# Patient Record
Sex: Female | Born: 1965 | Race: White | Hispanic: No | Marital: Single | Smoking: Current every day smoker
Health system: Southern US, Community
[De-identification: ages and names within clinical notes are randomized; demographics above are authoritative.]

## PROBLEM LIST (undated history)

## (undated) DIAGNOSIS — F419 Anxiety disorder, unspecified: Secondary | ICD-10-CM

## (undated) DIAGNOSIS — F32A Depression, unspecified: Secondary | ICD-10-CM

## (undated) DIAGNOSIS — F410 Panic disorder [episodic paroxysmal anxiety] without agoraphobia: Secondary | ICD-10-CM

## (undated) DIAGNOSIS — Q211 Atrial septal defect, unspecified: Secondary | ICD-10-CM

## (undated) DIAGNOSIS — R011 Cardiac murmur, unspecified: Secondary | ICD-10-CM

## (undated) DIAGNOSIS — I10 Essential (primary) hypertension: Secondary | ICD-10-CM

## (undated) DIAGNOSIS — I639 Cerebral infarction, unspecified: Secondary | ICD-10-CM

## (undated) DIAGNOSIS — L409 Psoriasis, unspecified: Secondary | ICD-10-CM

## (undated) HISTORY — DX: Psoriasis, unspecified: L40.9

## (undated) HISTORY — DX: Atrial septal defect: Q21.1

## (undated) HISTORY — PX: CHOLECYSTECTOMY: SHX55

## (undated) HISTORY — DX: Depression, unspecified: F32.A

## (undated) HISTORY — DX: Anxiety disorder, unspecified: F41.9

## (undated) HISTORY — DX: Atrial septal defect, unspecified: Q21.10

## (undated) HISTORY — DX: Panic disorder (episodic paroxysmal anxiety): F41.0

---

## 2005-12-15 ENCOUNTER — Emergency Department (HOSPITAL_COMMUNITY): Admission: EM | Admit: 2005-12-15 | Discharge: 2005-12-15 | Payer: Self-pay | Admitting: Emergency Medicine

## 2005-12-17 ENCOUNTER — Emergency Department (HOSPITAL_COMMUNITY): Admission: EM | Admit: 2005-12-17 | Discharge: 2005-12-17 | Payer: Self-pay | Admitting: Emergency Medicine

## 2006-07-07 ENCOUNTER — Emergency Department (HOSPITAL_COMMUNITY): Admission: EM | Admit: 2006-07-07 | Discharge: 2006-07-07 | Payer: Self-pay | Admitting: Emergency Medicine

## 2006-07-09 ENCOUNTER — Emergency Department (HOSPITAL_COMMUNITY): Admission: EM | Admit: 2006-07-09 | Discharge: 2006-07-09 | Payer: Self-pay | Admitting: Emergency Medicine

## 2007-03-20 ENCOUNTER — Encounter: Payer: Self-pay | Admitting: Family Medicine

## 2007-03-20 LAB — CONVERTED CEMR LAB
Basophils Absolute: 0 10*3/uL (ref 0.0–0.1)
Eosinophils Relative: 2 % (ref 0–5)
Lymphocytes Relative: 46 % (ref 12–46)
Lymphs Abs: 2.4 10*3/uL (ref 0.7–3.3)
Neutro Abs: 2.2 10*3/uL (ref 1.7–7.7)
Neutrophils Relative %: 43 % (ref 43–77)
Platelets: 312 10*3/uL (ref 150–400)
RDW: 11.9 % (ref 11.5–14.0)
WBC: 5.2 10*3/uL (ref 4.0–10.5)

## 2007-04-19 ENCOUNTER — Encounter: Payer: Self-pay | Admitting: Family Medicine

## 2007-04-19 LAB — CONVERTED CEMR LAB
AST: 21 units/L (ref 0–37)
BUN: 7 mg/dL (ref 6–23)
Bilirubin, Direct: 0.1 mg/dL (ref 0.0–0.3)
CO2: 23 meq/L (ref 19–32)
Calcium: 9.2 mg/dL (ref 8.4–10.5)
Eosinophils Relative: 1 % (ref 0–5)
Glucose, Bld: 92 mg/dL (ref 70–99)
HCT: 36.2 % (ref 36.0–46.0)
Hemoglobin: 12.6 g/dL (ref 12.0–15.0)
Indirect Bilirubin: 0.3 mg/dL (ref 0.0–0.9)
Lymphocytes Relative: 51 % — ABNORMAL HIGH (ref 12–46)
Lymphs Abs: 2 10*3/uL (ref 0.7–3.3)
Monocytes Relative: 10 % (ref 3–11)
Platelets: 381 10*3/uL (ref 150–400)
RBC: 3.78 M/uL — ABNORMAL LOW (ref 3.87–5.11)
Sodium: 131 meq/L — ABNORMAL LOW (ref 135–145)
TSH: 1.971 microintl units/mL (ref 0.350–5.50)
Total Bilirubin: 0.4 mg/dL (ref 0.3–1.2)
Total CHOL/HDL Ratio: 2.3
VLDL: 24 mg/dL (ref 0–40)
WBC: 4 10*3/uL (ref 4.0–10.5)

## 2008-01-28 ENCOUNTER — Other Ambulatory Visit: Admission: RE | Admit: 2008-01-28 | Discharge: 2008-01-28 | Payer: Self-pay | Admitting: Unknown Physician Specialty

## 2008-01-28 ENCOUNTER — Encounter (INDEPENDENT_AMBULATORY_CARE_PROVIDER_SITE_OTHER): Payer: Self-pay | Admitting: Unknown Physician Specialty

## 2009-01-02 ENCOUNTER — Emergency Department (HOSPITAL_COMMUNITY): Admission: EM | Admit: 2009-01-02 | Discharge: 2009-01-02 | Payer: Self-pay | Admitting: Emergency Medicine

## 2009-02-27 ENCOUNTER — Emergency Department (HOSPITAL_COMMUNITY): Admission: EM | Admit: 2009-02-27 | Discharge: 2009-02-27 | Payer: Self-pay | Admitting: Emergency Medicine

## 2009-05-31 ENCOUNTER — Inpatient Hospital Stay (HOSPITAL_COMMUNITY): Admission: EM | Admit: 2009-05-31 | Discharge: 2009-06-02 | Payer: Self-pay | Admitting: Emergency Medicine

## 2010-10-09 ENCOUNTER — Encounter: Payer: Self-pay | Admitting: Family Medicine

## 2010-12-23 LAB — BASIC METABOLIC PANEL
BUN: 5 mg/dL — ABNORMAL LOW (ref 6–23)
BUN: 5 mg/dL — ABNORMAL LOW (ref 6–23)
CO2: 21 mEq/L (ref 19–32)
Calcium: 7.4 mg/dL — ABNORMAL LOW (ref 8.4–10.5)
Calcium: 7.5 mg/dL — ABNORMAL LOW (ref 8.4–10.5)
Calcium: 8.3 mg/dL — ABNORMAL LOW (ref 8.4–10.5)
Creatinine, Ser: 0.73 mg/dL (ref 0.4–1.2)
GFR calc Af Amer: >60 mL/min (ref >60)
GFR calc non Af Amer: >60 mL/min (ref >60)
GFR calc non Af Amer: >60 mL/min (ref >60)
GFR calc non Af Amer: >60 mL/min (ref >60)
Glucose, Bld: 117 mg/dL — ABNORMAL HIGH (ref 70–99)
Glucose, Bld: 138 mg/dL — ABNORMAL HIGH (ref 70–99)
Glucose, Bld: 140 mg/dL — ABNORMAL HIGH (ref 70–99)
Sodium: 135 mEq/L (ref 135–145)
Sodium: 136 mEq/L (ref 135–145)

## 2010-12-23 LAB — URINALYSIS, ROUTINE W REFLEX MICROSCOPIC
Leukocytes, UA: NEGATIVE
Nitrite: POSITIVE — AB
Specific Gravity, Urine: 1.025 (ref 1.005–1.030)
Urobilinogen, UA: 0.2 mg/dL (ref 0.0–1.0)

## 2010-12-23 LAB — CULTURE, BLOOD (ROUTINE X 2)
Culture: NO GROWTH
Culture: NO GROWTH

## 2010-12-23 LAB — CBC
Hemoglobin: 12.2 g/dL (ref 12.0–15.0)
Hemoglobin: 13.3 g/dL (ref 12.0–15.0)
Platelets: 195 10*3/uL (ref 150–400)
RBC: 4.04 MIL/uL (ref 3.87–5.11)
RDW: 14.3 % (ref 11.5–15.5)
RDW: 14.8 % (ref 11.5–15.5)

## 2010-12-23 LAB — DIFFERENTIAL
Basophils Absolute: 0 10*3/uL (ref 0.0–0.1)
Basophils Absolute: 0.1 10*3/uL (ref 0.0–0.1)
Lymphocytes Relative: 13 % (ref 12–46)
Lymphocytes Relative: 18 % (ref 12–46)
Monocytes Absolute: 0.6 10*3/uL (ref 0.1–1.0)
Neutro Abs: 11.2 10*3/uL — ABNORMAL HIGH (ref 1.7–7.7)
Neutro Abs: 9.1 10*3/uL — ABNORMAL HIGH (ref 1.7–7.7)
Neutrophils Relative %: 76 % (ref 43–77)

## 2010-12-23 LAB — RAPID URINE DRUG SCREEN, HOSP PERFORMED
Amphetamines: NOT DETECTED
Tetrahydrocannabinol: NOT DETECTED

## 2010-12-23 LAB — URINE MICROSCOPIC-ADD ON

## 2010-12-23 LAB — ACETAMINOPHEN LEVEL: Acetaminophen (Tylenol), Serum: <10 ug/mL — ABNORMAL LOW (ref 10–30)

## 2010-12-23 LAB — PREGNANCY, URINE: Preg Test, Ur: NEGATIVE

## 2010-12-23 LAB — MAGNESIUM: Magnesium: 1.7 mg/dL (ref 1.5–2.5)

## 2010-12-28 LAB — CBC
HCT: 44.9 % (ref 36.0–46.0)
MCHC: 34.6 g/dL (ref 30.0–36.0)
MCV: 91.1 fL (ref 78.0–100.0)
RBC: 4.93 MIL/uL (ref 3.87–5.11)

## 2010-12-28 LAB — BASIC METABOLIC PANEL
BUN: 6 mg/dL (ref 6–23)
CO2: 24 mEq/L (ref 19–32)
Chloride: 106 mEq/L (ref 96–112)
Creatinine, Ser: 0.83 mg/dL (ref 0.4–1.2)
GFR calc Af Amer: >60 mL/min (ref >60)
Potassium: 3.6 mEq/L (ref 3.5–5.1)

## 2010-12-28 LAB — DIFFERENTIAL
Eosinophils Relative: 2 % (ref 0–5)
Lymphocytes Relative: 15 % (ref 12–46)
Lymphs Abs: 1.5 10*3/uL (ref 0.7–4.0)
Monocytes Absolute: 0.4 10*3/uL (ref 0.1–1.0)
Neutro Abs: 8.3 10*3/uL — ABNORMAL HIGH (ref 1.7–7.7)

## 2010-12-28 LAB — PREGNANCY, URINE: Preg Test, Ur: NEGATIVE

## 2011-01-08 ENCOUNTER — Emergency Department (HOSPITAL_COMMUNITY)
Admission: EM | Admit: 2011-01-08 | Discharge: 2011-01-09 | Disposition: A | Discharge status: Home / Self Care | Payer: Self-pay | Attending: Emergency Medicine | Admitting: Emergency Medicine

## 2011-01-08 ENCOUNTER — Emergency Department (HOSPITAL_COMMUNITY): Payer: Self-pay

## 2011-01-08 ENCOUNTER — Emergency Department (HOSPITAL_COMMUNITY)
Admission: EM | Admit: 2011-01-08 | Discharge: 2011-01-08 | Disposition: A | Discharge status: Home / Self Care | Payer: Self-pay | Attending: Emergency Medicine | Admitting: Emergency Medicine

## 2011-01-08 DIAGNOSIS — K089 Disorder of teeth and supporting structures, unspecified: Secondary | ICD-10-CM | POA: Insufficient documentation

## 2011-01-08 DIAGNOSIS — K047 Periapical abscess without sinus: Secondary | ICD-10-CM | POA: Insufficient documentation

## 2011-01-08 DIAGNOSIS — L03211 Cellulitis of face: Secondary | ICD-10-CM | POA: Insufficient documentation

## 2011-01-08 DIAGNOSIS — L0201 Cutaneous abscess of face: Secondary | ICD-10-CM | POA: Insufficient documentation

## 2011-01-08 LAB — BASIC METABOLIC PANEL
BUN: 7 mg/dL (ref 6–23)
CO2: 25 mEq/L (ref 19–32)
Chloride: 103 mEq/L (ref 96–112)
Glucose, Bld: 86 mg/dL (ref 70–99)
Potassium: 3.5 mEq/L (ref 3.5–5.1)
Sodium: 136 mEq/L (ref 135–145)

## 2011-01-08 LAB — CBC
HCT: 41.7 % (ref 36.0–46.0)
Hemoglobin: 14.4 g/dL (ref 12.0–15.0)
MCV: 91.6 fL (ref 78.0–100.0)
RBC: 4.55 MIL/uL (ref 3.87–5.11)
RDW: 13 % (ref 11.5–15.5)
WBC: 10.7 10*3/uL — ABNORMAL HIGH (ref 4.0–10.5)

## 2011-01-08 LAB — DIFFERENTIAL
Basophils Absolute: 0 10*3/uL (ref 0.0–0.1)
Eosinophils Relative: 1 % (ref 0–5)
Lymphocytes Relative: 16 % (ref 12–46)
Lymphs Abs: 1.7 10*3/uL (ref 0.7–4.0)
Neutro Abs: 8 10*3/uL — ABNORMAL HIGH (ref 1.7–7.7)

## 2011-01-08 MED ORDER — IOHEXOL 300 MG/ML  SOLN
75.0000 mL | Freq: Once | INTRAMUSCULAR | Status: AC | PRN
  Administered 2011-01-08: 75 mL via INTRAVENOUS

## 2012-03-22 ENCOUNTER — Other Ambulatory Visit (HOSPITAL_COMMUNITY): Payer: Self-pay | Admitting: Nurse Practitioner

## 2012-03-22 DIAGNOSIS — Z139 Encounter for screening, unspecified: Secondary | ICD-10-CM

## 2012-03-28 ENCOUNTER — Ambulatory Visit (HOSPITAL_COMMUNITY)
Admission: RE | Admit: 2012-03-28 | Discharge: 2012-03-28 | Disposition: A | Discharge status: Home / Self Care | Payer: Self-pay | Source: Ambulatory Visit | Attending: Family Medicine | Admitting: Family Medicine

## 2012-03-28 DIAGNOSIS — Z139 Encounter for screening, unspecified: Secondary | ICD-10-CM

## 2013-09-18 DIAGNOSIS — I639 Cerebral infarction, unspecified: Secondary | ICD-10-CM

## 2013-09-18 HISTORY — DX: Cerebral infarction, unspecified: I63.9

## 2014-07-24 ENCOUNTER — Inpatient Hospital Stay (HOSPITAL_COMMUNITY)
Admission: EM | Admit: 2014-07-24 | Discharge: 2014-07-25 | DRG: 305 | Disposition: A | Discharge status: Home / Self Care | Payer: Self-pay | Attending: Internal Medicine | Admitting: Internal Medicine

## 2014-07-24 ENCOUNTER — Emergency Department (HOSPITAL_COMMUNITY): Payer: Self-pay

## 2014-07-24 ENCOUNTER — Encounter (HOSPITAL_COMMUNITY): Payer: Self-pay | Admitting: Emergency Medicine

## 2014-07-24 DIAGNOSIS — R51 Headache: Secondary | ICD-10-CM | POA: Diagnosis not present

## 2014-07-24 DIAGNOSIS — G510 Bell's palsy: Secondary | ICD-10-CM | POA: Diagnosis present

## 2014-07-24 DIAGNOSIS — F17210 Nicotine dependence, cigarettes, uncomplicated: Secondary | ICD-10-CM | POA: Diagnosis present

## 2014-07-24 DIAGNOSIS — Z9114 Patient's other noncompliance with medication regimen: Secondary | ICD-10-CM | POA: Diagnosis present

## 2014-07-24 DIAGNOSIS — Z720 Tobacco use: Secondary | ICD-10-CM

## 2014-07-24 DIAGNOSIS — I10 Essential (primary) hypertension: Principal | ICD-10-CM | POA: Diagnosis present

## 2014-07-24 DIAGNOSIS — F17200 Nicotine dependence, unspecified, uncomplicated: Secondary | ICD-10-CM | POA: Diagnosis present

## 2014-07-24 DIAGNOSIS — I160 Hypertensive urgency: Secondary | ICD-10-CM | POA: Diagnosis present

## 2014-07-24 DIAGNOSIS — Q211 Atrial septal defect, unspecified: Secondary | ICD-10-CM

## 2014-07-24 DIAGNOSIS — I161 Hypertensive emergency: Secondary | ICD-10-CM

## 2014-07-24 DIAGNOSIS — R29810 Facial weakness: Secondary | ICD-10-CM | POA: Diagnosis present

## 2014-07-24 DIAGNOSIS — T463X5A Adverse effect of coronary vasodilators, initial encounter: Secondary | ICD-10-CM | POA: Diagnosis not present

## 2014-07-24 HISTORY — DX: Essential (primary) hypertension: I10

## 2014-07-24 HISTORY — DX: Cardiac murmur, unspecified: R01.1

## 2014-07-24 LAB — CBC WITH DIFFERENTIAL/PLATELET
BASOS ABS: 0 10*3/uL (ref 0.0–0.1)
BASOS PCT: 1 % (ref 0–1)
EOS PCT: 7 % — AB (ref 0–5)
Eosinophils Absolute: 0.5 10*3/uL (ref 0.0–0.7)
HEMATOCRIT: 42.3 % (ref 36.0–46.0)
HEMOGLOBIN: 14.8 g/dL (ref 12.0–15.0)
Lymphocytes Relative: 24 % (ref 12–46)
Lymphs Abs: 1.6 10*3/uL (ref 0.7–4.0)
MCH: 32.2 pg (ref 26.0–34.0)
MCHC: 35 g/dL (ref 30.0–36.0)
MCV: 92 fL (ref 78.0–100.0)
MONO ABS: 0.5 10*3/uL (ref 0.1–1.0)
MONOS PCT: 8 % (ref 3–12)
NEUTROS ABS: 4.1 10*3/uL (ref 1.7–7.7)
Neutrophils Relative %: 60 % (ref 43–77)
Platelets: 151 10*3/uL (ref 150–400)
RBC: 4.6 MIL/uL (ref 3.87–5.11)
RDW: 14 % (ref 11.5–15.5)
WBC: 6.8 10*3/uL (ref 4.0–10.5)

## 2014-07-24 LAB — RAPID URINE DRUG SCREEN, HOSP PERFORMED
AMPHETAMINES: NOT DETECTED
Barbiturates: NOT DETECTED
Benzodiazepines: NOT DETECTED
Cocaine: NOT DETECTED
OPIATES: NOT DETECTED
Tetrahydrocannabinol: NOT DETECTED

## 2014-07-24 LAB — URINALYSIS, ROUTINE W REFLEX MICROSCOPIC
Bilirubin Urine: NEGATIVE
GLUCOSE, UA: NEGATIVE mg/dL
Hgb urine dipstick: NEGATIVE
KETONES UR: NEGATIVE mg/dL
NITRITE: NEGATIVE
Protein, ur: NEGATIVE mg/dL
Specific Gravity, Urine: 1.01 (ref 1.005–1.030)
UROBILINOGEN UA: 0.2 mg/dL (ref 0.0–1.0)
pH: 6.5 (ref 5.0–8.0)

## 2014-07-24 LAB — MRSA PCR SCREENING: MRSA by PCR: NEGATIVE

## 2014-07-24 LAB — BASIC METABOLIC PANEL
ANION GAP: 12 (ref 5–15)
BUN: 13 mg/dL (ref 6–23)
CALCIUM: 9.3 mg/dL (ref 8.4–10.5)
CHLORIDE: 103 meq/L (ref 96–112)
CO2: 26 meq/L (ref 19–32)
CREATININE: 0.88 mg/dL (ref 0.50–1.10)
GFR calc Af Amer: 90 mL/min — ABNORMAL LOW (ref >90)
GFR calc non Af Amer: 78 mL/min — ABNORMAL LOW (ref >90)
GLUCOSE: 90 mg/dL (ref 70–99)
Potassium: 4.4 mEq/L (ref 3.7–5.3)
Sodium: 141 mEq/L (ref 137–147)

## 2014-07-24 LAB — TROPONIN I
Troponin I: <0.3 ng/mL (ref <0.30)
Troponin I: <0.3 ng/mL (ref <0.30)

## 2014-07-24 LAB — URINE MICROSCOPIC-ADD ON

## 2014-07-24 MED ORDER — ACETAMINOPHEN 650 MG RE SUPP: 650.0000 mg | Freq: Four times a day (QID) | RECTAL | Status: DC | PRN

## 2014-07-24 MED ORDER — ONDANSETRON HCL 4 MG/2ML IJ SOLN: 4.0000 mg | Freq: Four times a day (QID) | INTRAMUSCULAR | Status: DC | PRN

## 2014-07-24 MED ORDER — SODIUM CHLORIDE 0.45 % IV SOLN
INTRAVENOUS | Status: DC
  Administered 2014-07-24: 19:00:00 via INTRAVENOUS

## 2014-07-24 MED ORDER — ACETAMINOPHEN 325 MG PO TABS
650.0000 mg | ORAL_TABLET | Freq: Four times a day (QID) | ORAL | Status: DC | PRN
  Administered 2014-07-24: 650 mg via ORAL
  Filled 2014-07-24: qty 2

## 2014-07-24 MED ORDER — NITROGLYCERIN IN D5W 200-5 MCG/ML-% IV SOLN
0.0000 ug/min | INTRAVENOUS | Status: DC
  Administered 2014-07-24: 5 ug/min via INTRAVENOUS
  Filled 2014-07-24: qty 250

## 2014-07-24 MED ORDER — ONDANSETRON HCL 4 MG PO TABS
4.0000 mg | ORAL_TABLET | Freq: Four times a day (QID) | ORAL | Status: DC | PRN
  Administered 2014-07-24: 4 mg via ORAL
  Filled 2014-07-24: qty 1

## 2014-07-24 MED ORDER — METOPROLOL TARTRATE 25 MG PO TABS
25.0000 mg | ORAL_TABLET | Freq: Two times a day (BID) | ORAL | Status: DC
  Administered 2014-07-24: 25 mg via ORAL
  Filled 2014-07-24: qty 1

## 2014-07-24 MED ORDER — ASPIRIN 81 MG PO CHEW
324.0000 mg | CHEWABLE_TABLET | Freq: Once | ORAL | Status: AC
  Administered 2014-07-24: 324 mg via ORAL
  Filled 2014-07-24: qty 4

## 2014-07-24 MED ORDER — SODIUM CHLORIDE 0.9 % IV SOLN
Freq: Once | INTRAVENOUS | Status: AC
  Administered 2014-07-24: 13:00:00 via INTRAVENOUS

## 2014-07-24 MED ORDER — HEPARIN SODIUM (PORCINE) 5000 UNIT/ML IJ SOLN
5000.0000 [IU] | Freq: Three times a day (TID) | INTRAMUSCULAR | Status: DC
  Administered 2014-07-24 – 2014-07-25 (×3): 5000 [IU] via SUBCUTANEOUS
  Filled 2014-07-24 (×2): qty 1

## 2014-07-24 MED ORDER — LISINOPRIL 10 MG PO TABS
20.0000 mg | ORAL_TABLET | Freq: Every day | ORAL | Status: DC
  Administered 2014-07-24 – 2014-07-25 (×2): 20 mg via ORAL
  Filled 2014-07-24 (×2): qty 2

## 2014-07-24 MED ORDER — NICARDIPINE HCL IN NACL 20-0.86 MG/200ML-% IV SOLN
3.0000 mg/h | INTRAVENOUS | Status: DC
  Administered 2014-07-24: 5 mg/h via INTRAVENOUS
  Filled 2014-07-24: qty 200

## 2014-07-24 MED ORDER — ASPIRIN EC 81 MG PO TBEC
81.0000 mg | DELAYED_RELEASE_TABLET | Freq: Every day | ORAL | Status: DC
  Administered 2014-07-24 – 2014-07-25 (×2): 81 mg via ORAL
  Filled 2014-07-24 (×2): qty 1

## 2014-07-24 NOTE — Consult Note (Signed)
Meghan A. Merlene Laughter, MD     [email or web address]          Meghan Simmons is an 48 y.o. female.   ASSESSMENT/PLAN: Left facial weakness due to accelerated hypertension/hypertensive crisis.  Headaches due to nitroglycerin improving.    The patient is a 48 year old white female who has a history of hypertension. She has been on antihypertensive medication for quite a while. She does admit that she does not take her medications consistently because of being uninsured. The patient has been out of her medications for the past 2 months because of not having the funds. She developed the rather acute onset of left facial weakness, tingling or numbness. It appears that she may have had some dysarthria although this has resolved. She presented to the emergency room and was noted to have severe elevation in blood pressure with a systolic of 010 and diastolic of 932. She does report having some retrosternal chest discomfort that radiates to the left shoulder region. She does not report any numbness or weakness of the upper extremities. There is no GI or GU symptoms. She did not have a headache before she will was hospitalized. She reports that the IV nitroglycerin drip has caused headache. The patient reports having some blurry vision involving the left eye. She reports having a cat and having increased matter of her eyes recently after playing with a cat. The review of systems otherwise negative.  GENERAL: This a pleasant female in no acute distress.  HEENT: Supple. Atraumatic normocephalic. There appears to be some injection of the right eye although this is asymptomatic side.  ABDOMEN: soft  EXTREMITIES: No edema   BACK: Normal.  SKIN: Normal by inspection.    MENTAL STATUS: Alert and oriented. Speech, language and cognition are generally intact. Judgment and insight normal.   CRANIAL NERVES: Pupils are equal, round and reactive to light and accommodation; extra ocular  movements are full, there is no significant nystagmus; visual fields are full; upper and lower facial muscles are normal in strength and symmetric, there is mild flattening of the nasolabial fold on the left side; tongue is midline; uvula is midline; shoulder elevation is normal. Funduscopic examination shows a mild blurring of the disc margin. I do not appreciate spontaneous venous pulsations on either side.  MOTOR: Normal tone, bulk and strength; no pronator drift.  COORDINATION: Left finger to nose is normal, right finger to nose is normal, No rest tremor; no intention tremor; no postural tremor; no bradykinesia.  REFLEXES: Deep tendon reflexes are symmetrical and normal. Babinski reflexes are flexor bilaterally.   SENSATION: Normal to light touch.   Brain MRI scan is reviewed in person. There is no increased signal seen on diffusion imaging. FLAIR imaging is also unrevealing without white matter tract lesions. There appears to be atrophy of the occipital regions bilaterally greater than expected for age.   Blood pressure 180/110, pulse 67, temperature 97.6 F (36.4 C), temperature source Oral, resp. rate 15, height '5\' 4"'  (1.626 m), weight 80.74 kg (178 lb), last menstrual period 04/23/2014, SpO2 100 %.  Past Medical History  Diagnosis Date  . Hypertension   . Heart murmur     Past Surgical History  Procedure Laterality Date  . Cholecystectomy      History reviewed. No pertinent family history.  Social History:  reports that she has been smoking Cigarettes.  She has been smoking about 1.50 packs per day. She does not have any smokeless tobacco history on file. She reports  that she drinks alcohol. She reports that she does not use illicit drugs.  Allergies:  Allergies  Allergen Reactions  . Erythromycin Nausea And Vomiting and Rash    Medications: Prior to Admission medications   Medication Sig Start Date End Date Taking? Authorizing Provider  aspirin 325 MG tablet Take 325  mg by mouth daily as needed for moderate pain.   Yes Historical Provider, MD  OVER THE COUNTER MEDICATION Place 1 drop into both eyes daily as needed (irritation).   Yes Historical Provider, MD    Scheduled Meds: . aspirin EC  81 mg Oral Daily  . heparin  5,000 Units Subcutaneous 3 times per day  . lisinopril  20 mg Oral Daily  . metoprolol tartrate  25 mg Oral BID   Continuous Infusions: . sodium chloride 50 mL/hr at 07/24/14 1901  . niCARDipine 5 mg/hr (07/24/14 1956)   PRN Meds:.acetaminophen **OR** acetaminophen, ondansetron **OR** ondansetron (ZOFRAN) IV     Results for orders placed or performed during the hospital encounter of 07/24/14 (from the past 48 hour(s))  Basic metabolic panel     Status: Abnormal   Collection Time: 07/24/14 11:27 AM  Result Value Ref Range   Sodium 141 137 - 147 mEq/L   Potassium 4.4 3.7 - 5.3 mEq/L   Chloride 103 96 - 112 mEq/L   CO2 26 19 - 32 mEq/L   Glucose, Bld 90 70 - 99 mg/dL   BUN 13 6 - 23 mg/dL   Creatinine, Ser 0.88 0.50 - 1.10 mg/dL   Calcium 9.3 8.4 - 10.5 mg/dL   GFR calc non Af Amer 78 (L) >90 mL/min   GFR calc Af Amer 90 (L) >90 mL/min    Comment: (NOTE) The eGFR has been calculated using the CKD EPI equation. This calculation has not been validated in all clinical situations. eGFR's persistently <90 mL/min signify possible Chronic Kidney Disease.    Anion gap 12 5 - 15  CBC with Differential     Status: Abnormal   Collection Time: 07/24/14 11:27 AM  Result Value Ref Range   WBC 6.8 4.0 - 10.5 K/uL   RBC 4.60 3.87 - 5.11 MIL/uL   Hemoglobin 14.8 12.0 - 15.0 g/dL   HCT 42.3 36.0 - 46.0 %   MCV 92.0 78.0 - 100.0 fL   MCH 32.2 26.0 - 34.0 pg   MCHC 35.0 30.0 - 36.0 g/dL   RDW 14.0 11.5 - 15.5 %   Platelets 151 150 - 400 K/uL   Neutrophils Relative % 60 43 - 77 %   Neutro Abs 4.1 1.7 - 7.7 K/uL   Lymphocytes Relative 24 12 - 46 %   Lymphs Abs 1.6 0.7 - 4.0 K/uL   Monocytes Relative 8 3 - 12 %   Monocytes Absolute  0.5 0.1 - 1.0 K/uL   Eosinophils Relative 7 (H) 0 - 5 %   Eosinophils Absolute 0.5 0.0 - 0.7 K/uL   Basophils Relative 1 0 - 1 %   Basophils Absolute 0.0 0.0 - 0.1 K/uL  Troponin I     Status: None   Collection Time: 07/24/14 11:27 AM  Result Value Ref Range   Troponin I <0.30 <0.30 ng/mL    Comment:        Due to the release kinetics of cTnI, a negative result within the first hours of the onset of symptoms does not rule out myocardial infarction with certainty. If myocardial infarction is still suspected, repeat the test at appropriate intervals.  MRSA PCR Screening     Status: None   Collection Time: 07/24/14  3:54 PM  Result Value Ref Range   MRSA by PCR NEGATIVE NEGATIVE    Comment:        The GeneXpert MRSA Assay (FDA approved for NASAL specimens only), is one component of a comprehensive MRSA colonization surveillance program. It is not intended to diagnose MRSA infection nor to guide or monitor treatment for MRSA infections.   Troponin I     Status: None   Collection Time: 07/24/14  7:05 PM  Result Value Ref Range   Troponin I <0.30 <0.30 ng/mL    Comment:        Due to the release kinetics of cTnI, a negative result within the first hours of the onset of symptoms does not rule out myocardial infarction with certainty. If myocardial infarction is still suspected, repeat the test at appropriate intervals.     Studies/Results: BRAIN MRI FINDINGS: Images are mildly degraded by motion artifact despite repeating multiple sequences.  There is no acute infarct. Ventricles and sulci are normal for age. There is no evidence of intracranial hemorrhage, mass, midline shift, or extra-axial fluid collection. No brain parenchymal signal abnormality is identified.  Orbits are unremarkable. Paranasal sinuses and mastoid air cells are clear. Major intracranial vascular flow voids are preserved. Distal right vertebral artery appears hypoplastic. Calvarium and scalp  soft tissues are unremarkable.  IMPRESSION: No acute intracranial abnormality identified. Unremarkable appearance of the brain allowing for mild motion artifact    Janaria Mccammon A. Merlene Simmons, M.D.  Diplomate, Tax adviser of Psychiatry and Neurology ( Neurology). 07/24/2014, 8:15 PM

## 2014-07-24 NOTE — Progress Notes (Signed)
[facility] Progress Note Patient Name: NEOMI LAIDLER DOB: [birth year] MRN: [id number]   Date of Service  07/24/2014  HPI/Events of Note  Hypertensive crisis Adm MAP 190 - goal 20-30% reduction  eICU Interventions  Use cardene for MAP goal 110-120   New ICU patient evaluation: This patient was evaluated by the [facility] team. I have reviewed relevant documentation including care plan & orders.   Intervention Category Evaluation Type: New Patient Evaluation  ALVA,RAKESH V. 07/24/2014, 7:50 PM

## 2014-07-24 NOTE — ED Notes (Signed)
Continues to have a left sided facial droop.  No distress at this time.  Neuro exam otherwise normal.

## 2014-07-24 NOTE — ED Notes (Signed)
PT stated yesterday evening she noticed the left side of her face became numb and her mouth had a droop and her left eyelid would not close tight. PT has visual left sided facial droop on arrival to ED. PT states she has been out of her HTN medication x2 months.

## 2014-07-24 NOTE — H&P (Signed)
[facility] History and Physical  Meghan Simmons TTS:[id number] DOB: February 29, 1966 DOA: 07/24/2014  Referring physician: Dr. Winfred Leeds PCP: No PCP Per Patient   Chief Complaint: left facial droop  HPI: Meghan Simmons is a 48 y.o. female this 48 year old female with a history of hypertension who has not taken any medication in the past few months since she's been unable to afford it. Patient reports being usual state of health when yesterday evening, she developed drainage in her right eye. She noticed crusting on her lip when she woke up this morning. Through the course of the day, she developed left facial droop and had difficulty drinking her coffee. She reports that her coffee repeatedly spilled out of her mouth. She notices that she is having difficulty closing her left eye. She reports tingling on the left side of her face, also describes pain in her left neck and left trapezius area. She was concerned that she may be having a stroke and came to the emergency room for evaluation. On evaluation, she was noted to be severely hypertensive with a systolic blood pressure of 255/146. MRI brain was negative for stroke. She was started on nitroglycerin infusion and reports that she has since developed a headache as well as nausea. She denies any shortness of breath, vomiting, diarrhea. She also describes substernal chest pain which began today. Patient will be admitted to the hospital for further treatments.   Review of Systems:  Pertinent positives as per HPI, otherwise negative  Past Medical History  Diagnosis Date  . Hypertension   . Heart murmur    Past Surgical History  Procedure Laterality Date  . Cholecystectomy     Social History:  reports that she has been smoking Cigarettes.  She has been smoking about 1.50 packs per day. She does not have any smokeless tobacco history on file. She reports that she drinks alcohol. She reports that she does not use illicit drugs.  Allergies    Allergen Reactions  . Erythromycin Nausea And Vomiting and Rash    History reviewed. No pertinent family history.   Prior to Admission medications   Medication Sig Start Date End Date Taking? Authorizing Provider  aspirin 325 MG tablet Take 325 mg by mouth daily as needed for moderate pain.   Yes Historical Provider, MD  OVER THE COUNTER MEDICATION Place 1 drop into both eyes daily as needed (irritation).   Yes Historical Provider, MD   Physical Exam: Filed Vitals:   07/24/14 1656 07/24/14 1700 07/24/14 1715 07/24/14 1730  BP:  203/122 190/119 200/113  Pulse:  72  83  Temp: 97.6 F (36.4 C)     TempSrc: Oral     Resp:  13 16 23   Height:      Weight:      SpO2:  100%  93%    Wt Readings from Last 3 Encounters:  07/24/14 80.74 kg (178 lb)  07/24/14 80.74 kg (178 lb)    General:  Appears calm and comfortable Eyes: PERRL, normal lids, irises & conjunctiva ENT: grossly normal hearing, lips & tongue Neck: no LAD, masses or thyromegaly Cardiovascular: RRR, no m/r/g. No LE edema. Telemetry: SR, no arrhythmias  Respiratory: CTA bilaterally, no w/r/r. Normal respiratory effort. Abdomen: soft, ntnd Skin: no rash or induration seen on limited exam Musculoskeletal: grossly normal tone BUE/BLE Psychiatric: grossly normal mood and affect, speech fluent and appropriate Neurologic: strength is 5/5 in upper and lower extremities, parasthesias on the left face, mild left facial droop  Labs on Admission:  Basic Metabolic Panel:  Recent Labs Lab 07/24/14 1127  NA 141  K 4.4  CL 103  CO2 26  GLUCOSE 90  BUN 13  CREATININE 0.88  CALCIUM 9.3   Liver Function Tests: No results for input(s): AST, ALT, ALKPHOS, BILITOT, PROT, ALBUMIN in the last 168 hours. No results for input(s): LIPASE, AMYLASE in the last 168 hours. No results for input(s): AMMONIA in the last 168 hours. CBC:  Recent Labs Lab 07/24/14 1127  WBC 6.8  NEUTROABS 4.1  HGB 14.8  HCT 42.3  MCV  92.0  PLT 151   Cardiac Enzymes:  Recent Labs Lab 07/24/14 1127  TROPONINI <0.30    BNP (last 3 results) No results for input(s): PROBNP in the last 8760 hours. CBG: No results for input(s): GLUCAP in the last 168 hours.  Radiological Exams on Admission: Mr Brain Wo Contrast  07/24/2014   CLINICAL DATA:  Left-sided facial droop, facial numbness, and left-sided neck pain for the past day.  EXAM: MRI HEAD WITHOUT CONTRAST  TECHNIQUE: Multiplanar, multiecho pulse sequences of the brain and surrounding structures were obtained without intravenous contrast.  COMPARISON:  Head CT 01/02/2009  FINDINGS: Images are mildly degraded by motion artifact despite repeating multiple sequences.  There is no acute infarct. Ventricles and sulci are normal for age. There is no evidence of intracranial hemorrhage, mass, midline shift, or extra-axial fluid collection. No brain parenchymal signal abnormality is identified.  Orbits are unremarkable. Paranasal sinuses and mastoid air cells are clear. Major intracranial vascular flow voids are preserved. Distal right vertebral artery appears hypoplastic. Calvarium and scalp soft tissues are unremarkable.  IMPRESSION: No acute intracranial abnormality identified. Unremarkable appearance of the brain allowing for mild motion artifact.   Electronically Signed   By: Logan Bores   On: 07/24/2014 12:38    EKG: Independently reviewed. No acute findings  Assessment/Plan Active Problems:   Hypertensive urgency   Facial droop   Tobacco use disorder   1. Hypertensive urgency. Patient is unsure what her chronic medications were, but has not taken any medication in several months. She'll be started on a nicardipine infusion, oral Lopressor and lisinopril. Her infusion will be titrated off as tolerated. Check urine tox screen 2. Chest pain. Possibly related to #1. We'll cycle cardiac markers and check 2-D echocardiogram. EKG is nonischemic. 3. Left facial droop. MRI is  negative for stroke. Positive related to a Bell's palsy. A benefit from prednisone. Requested neurology consultation. 4. Tobacco use. Counseled on the importance of tobacco cessation. Patient declined a nicotine patch.  Code Status: full code DVT Prophylaxis: heparin sq Family Communication: discussed with patient Disposition Plan: discharge home once improved  Time spent: 57mins  Deja Pisarski [facility] Pager [phone]

## 2014-07-24 NOTE — ED Provider Notes (Signed)
CSN: [id number]     Arrival date & time 07/24/14  1003 History   First MD Initiated Contact with Patient 07/24/14 1055    This chart was scribed for Orlie Dakin, MD by Terressa Koyanagi, ED Scribe. This patient was seen in room APA12/APA12 and the patient's care was started at 10:55 AM.  Chief Complaint  Patient presents with  . Facial Droop   The history is provided by the patient. No language interpreter was used.   PCP: [facility] HPI Comments: Meghan Simmons is a 48 y.o. female, with medical Hx noted below and significant for HTN,  noncompliance (pt has not taken her HTN meds for the past two months), and tobacco use (1.5 ppd), who presents to the Emergency Department complaining of acute, left sided facial droop with associated numbness onset yesterday evening. Pt also complains that she cannot close her left eyelid completely resulting in buildup of eye discharge onset 2 days ago. Pt reports she has been applying eye drops to her left eye with some relief. Pt also complains of left sided neck pain and left sided chest pain. Neck pain and chest pain or mild at present. Nothing makes symptoms better or worse. She does admit to mild shortness of breath. No treatment prior to coming herePt denies substance abuse.    During exam pt's BP is 245/171.   Past Medical History  Diagnosis Date  . Hypertension   . Heart murmur    Past Surgical History  Procedure Laterality Date  . Cholecystectomy     No family history on file. History  Substance Use Topics  . Smoking status: Current Every Day Smoker -- 1.50 packs/day    Types: Cigarettes  . Smokeless tobacco: Not on file  . Alcohol Use: Yes     Comment: once a week   OB History    No data available     Review of Systems  Constitutional: Negative.   HENT: Negative.   Respiratory: Positive for chest tightness and shortness of breath.   Cardiovascular: Negative.   Gastrointestinal: Negative.   Musculoskeletal: Negative.   Skin:  Negative.   Neurological: Positive for facial asymmetry.  Psychiatric/Behavioral: Negative.   All other systems reviewed and are negative.     Allergies  Azithromycin  Home Medications   Prior to Admission medications   Not on File   Triage Vitals: BP 246/153 mmHg  Pulse 78  Temp(Src) 97.9 F (36.6 C) (Oral)  Resp 18  Ht 5\' 4"  (1.626 m)  Wt 178 lb (80.74 kg)  BMI 30.54 kg/m2  SpO2 98%  LMP 04/23/2014 Physical Exam  Constitutional: She is oriented to person, place, and time. She appears well-developed and well-nourished.  HENT:  Head: Normocephalic and atraumatic.  Eyes: Conjunctivae are normal. Pupils are equal, round, and reactive to light.  Neck: Neck supple. No tracheal deviation present. No thyromegaly present.  Cardiovascular: Normal rate and regular rhythm.   No murmur heard. Pulmonary/Chest: Effort normal and breath sounds normal.  Abdominal: Soft. Bowel sounds are normal. She exhibits no distension. There is no tenderness.  Musculoskeletal: Normal range of motion. She exhibits no edema or tenderness.  Neurological: She is alert and oriented to person, place, and time. She has normal reflexes. A cranial nerve deficit is present. Coordination normal.  Left-sided facial droop, most consistent with central cranial nerve VII deficitRomberg normal DTR symmetric bilaterally knee jerk ankle jerk biceps toes were downgoing bilaterally alert Glasgow Coma Score 15  Skin: Skin is warm and  dry. No rash noted.  Psychiatric: She has a normal mood and affect.  Nursing note and vitals reviewed.   ED Course  Procedures (including critical care time) DIAGNOSTIC STUDIES: Oxygen Saturation is 98% on RA, nl by my interpretation.    COORDINATION OF CARE: 11:04 AM-Discussed treatment plan which includes EKG, meds and hospital admittance with pt at bedside. Patient verbalizes understanding and agrees with treatment plan.     Labs Review Labs Reviewed - No data to  display  Imaging Review No results found.   EKG Interpretation   Date/Time:  Friday July 24 2014 10:34:07 EST Ventricular Rate:  76 PR Interval:  173 QRS Duration: 118 QT Interval:  427 QTC Calculation: 480 R Axis:   152 Text Interpretation:  Sinus rhythm Probable left atrial enlargement  Nonspecific intraventricular conduction delay Low voltage, precordial  leads Baseline wander in lead(s) V3 V4 V6 No significant change since last  tracing Confirmed by Lunell Robart  MD, Abbygael Curtiss ([id number]) on 07/24/2014 10:55:01 AM     1:30 PM chest pain is improved after treatment with intravenous nitroglycerin drip. His presently mild. MDM  I don't believe the patient suffered acute stroke with facial droop. MRI scan ordered to check for bleed or tumor. Clinically she has a peripheral seventh nerve palsy left side. I'm concerned about chest pain with extremely elevated blood pressure, and concern for hypertensive emergency Final diagnoses:  None  spoke with Dr. Roderic Palau,  Plan admit step down unit Diagnosis #1 hypertensive emergency 2 chest pain #3 Bell's palsy #4 medication noncompliance #5 tobacco abuse  CRITICAL CARE Performed by: Orlie Dakin Total critical care time: 30 minute Critical care time was exclusive of separately billable procedures and treating other patients. Critical care was necessary to treat or prevent imminent or life-threatening deterioration. Critical care was time spent personally by me on the following activities: development of treatment plan with patient and/or surrogate as well as nursing, discussions with consultants, evaluation of patient's response to treatment, examination of patient, obtaining history from patient or surrogate, ordering and performing treatments and interventions, ordering and review of laboratory studies, ordering and review of radiographic studies, pulse oximetry and re-evaluation of patient's condition.     Orlie Dakin, MD 07/24/14 1430

## 2014-07-24 NOTE — ED Notes (Signed)
Pt going to MRI.  EDP ok with waiting to start nitro drip when pt returns.

## 2014-07-25 DIAGNOSIS — Q211 Atrial septal defect, unspecified: Secondary | ICD-10-CM

## 2014-07-25 LAB — BASIC METABOLIC PANEL
ANION GAP: 12 (ref 5–15)
BUN: 13 mg/dL (ref 6–23)
CO2: 24 meq/L (ref 19–32)
CREATININE: 0.99 mg/dL (ref 0.50–1.10)
Calcium: 9 mg/dL (ref 8.4–10.5)
Chloride: 103 mEq/L (ref 96–112)
GFR calc Af Amer: 78 mL/min — ABNORMAL LOW (ref >90)
GFR calc non Af Amer: 67 mL/min — ABNORMAL LOW (ref >90)
Glucose, Bld: 103 mg/dL — ABNORMAL HIGH (ref 70–99)
Potassium: 4 mEq/L (ref 3.7–5.3)
Sodium: 139 mEq/L (ref 137–147)

## 2014-07-25 LAB — CBC
HEMATOCRIT: 39.3 % (ref 36.0–46.0)
Hemoglobin: 13.9 g/dL (ref 12.0–15.0)
MCH: 32.8 pg (ref 26.0–34.0)
MCHC: 35.4 g/dL (ref 30.0–36.0)
MCV: 92.7 fL (ref 78.0–100.0)
Platelets: 164 10*3/uL (ref 150–400)
RBC: 4.24 MIL/uL (ref 3.87–5.11)
RDW: 14.1 % (ref 11.5–15.5)
WBC: 7.7 10*3/uL (ref 4.0–10.5)

## 2014-07-25 LAB — TROPONIN I

## 2014-07-25 LAB — TSH: TSH: 1.51 u[IU]/mL (ref 0.350–4.500)

## 2014-07-25 MED ORDER — LISINOPRIL 20 MG PO TABS: 20.0000 mg | ORAL_TABLET | Freq: Every day | ORAL | Status: DC

## 2014-07-25 NOTE — Plan of Care (Signed)
Problem: Phase I Progression Outcomes Goal: Pain controlled with appropriate interventions Outcome: Not Progressing No complaints of pain Goal: OOB as tolerated unless otherwise ordered Outcome: Progressing Patient ambulating to bsc with minimal assist Goal: Initial discharge plan identified Outcome: Progressing To home Goal: Voiding-avoid urinary catheter unless indicated Outcome: Completed/Met Date Met:  07/25/14 Goal: Hemodynamically stable Outcome: Progressing From nitroglycerin gtt to Cardene gtt, to off, at present bradycardia while sleeping

## 2014-07-25 NOTE — Discharge Summary (Signed)
Physician Discharge Summary  Meghan Simmons FHL:[id number] DOB: 02-06-1966 DOA: 07/24/2014  PCP: No PCP Per Patient  Admit date: 07/24/2014 Discharge date: 07/25/2014  Time spent: 49minutes  Recommendations for Outpatient Follow-up:  1. Follow-up with primary care physician 1-2 weeks 2. Follow-up with cardiology in 1-2 weeks to further discuss echocardiogram findings  Discharge Diagnoses:  Active Problems:   Hypertensive urgency   Facial droop   Tobacco use disorder   ASD (atrial septal defect)   Discharge Condition: improved  Diet recommendation: low-salt  Filed Weights   07/24/14 1024 07/25/14 0500  Weight: 80.74 kg (178 lb) 75.5 kg (166 lb 7.2 oz)    History of present illness:  Meghan Simmons is a 48 y.o. female this 48 year old female with a history of hypertension who has not taken any medication in the past few months since she's been unable to afford it. Patient reports being usual state of health when yesterday evening, she developed drainage in her right eye. She noticed crusting on her lip when she woke up this morning. Through the course of the day, she developed left facial droop and had difficulty drinking her coffee. She reports that her coffee repeatedly spilled out of her mouth. She notices that she is having difficulty closing her left eye. She reports tingling on the left side of her face, also describes pain in her left neck and left trapezius area. She was concerned that she may be having a stroke and came to the emergency room for evaluation. On evaluation, she was noted to be severely hypertensive with a systolic blood pressure of 255/146. MRI brain was negative for stroke. She was started on nitroglycerin infusion and reports that she has since developed a headache as well as nausea. She denies any shortness of breath, vomiting, diarrhea. She also describes substernal chest pain which began today. Patient will be admitted to the hospital for further  treatments.  Hospital Course:  Patient was admitted to the stepdown unit for hypertensive urgency. She was started on oral lisinopril and briefly required a Cardene drip. Her blood pressure has since returned to normal range and she has been weaned off Cardene infusion. She did complain of tingling on the left side of her face as well as left-sided facial droop. MRI of the brain was found to be unremarkable. She was seen by neurology who felt her symptoms may be related to her severe hypertension. As her blood pressure improved, her symptoms also to improve. Her blood pressure appears to be reasonably controlled now. She was advised to comply with her medications. She will be continued on lisinopril since she appears to be improving with this and she can also afford it.  Echocardiogram was done that did show possible medium-sized ASD with left-to-right shunt. This was discussed with the patient and she admitted to being told that she always had a heart murmur. Since this appears to be a chronic process, we will schedule her for follow-up with cardiology to be seen in the outpatient setting and be considered for transesophageal echocardiogram. She can discuss with cardiology whether further intervention for her ASD is necessary.  Procedures:  Echo: - Left ventricle: The cavity size was normal. Wall thickness was increased in a pattern of mild LVH. Systolic function was normal. The estimated ejection fraction was in the range of 55% to 60%. Wall motion was normal; there were no regional wall motion abnormalities. - Right ventricle: The cavity size was dilated. Wall thickness was normal. - Right atrium: The  atrium was moderately to severely dilated. - Atrial septum: There was a medium-sized atrial septal defect. There was a left-to-right shunt. - Pulmonary arteries: Systolic pressure was mildly to moderately increased. PA peak pressure: 46 mm Hg  (S).   Consultations:  neurology  Discharge Exam: Filed Vitals:   07/25/14 1347  BP: 121/81  Pulse: 65  Temp: 99.2 F (37.3 C)  Resp: 18    General: no acute distress Cardiovascular: S1, S2, regular rate and rhythm Respiratory: clear to auscultation bilaterally  Discharge Instructions You were cared for by a hospitalist during your hospital stay. If you have any questions about your discharge medications or the care you received while you were in the hospital after you are discharged, you can call the unit and asked to speak with the hospitalist on call if the hospitalist that took care of you is not available. Once you are discharged, your primary care physician will handle any further medical issues. Please note that NO REFILLS for any discharge medications will be authorized once you are discharged, as it is imperative that you return to your primary care physician (or establish a relationship with a primary care physician if you do not have one) for your aftercare needs so that they can reassess your need for medications and monitor your lab values.  Discharge Instructions    Call MD for:  extreme fatigue    Complete by:  As directed      Call MD for:  persistant dizziness or light-headedness    Complete by:  As directed      Call MD for:  persistant nausea and vomiting    Complete by:  As directed      Diet - low sodium heart healthy    Complete by:  As directed      Increase activity slowly    Complete by:  As directed           Discharge Medication List as of 07/25/2014  3:23 PM    START taking these medications   Details  lisinopril (PRINIVIL,ZESTRIL) 20 MG tablet Take 1 tablet (20 mg total) by mouth daily., Starting 07/25/2014, Until Discontinued, Print      CONTINUE these medications which have NOT CHANGED   Details  aspirin 325 MG tablet Take 325 mg by mouth daily as needed for moderate pain., Until Discontinued, Historical Med    OVER THE COUNTER MEDICATION  Place 1 drop into both eyes daily as needed (irritation)., Until Discontinued, Historical Med       Allergies  Allergen Reactions  . Erythromycin Nausea And Vomiting and Rash   Follow-up Information    Follow up with cardiology clinic will call you with an appointment.      Follow up with follow up with health department in 2 weeks.       The results of significant diagnostics from this hospitalization (including imaging, microbiology, ancillary and laboratory) are listed below for reference.    Significant Diagnostic Studies: Mr Brain Wo Contrast  07/24/2014   CLINICAL DATA:  Left-sided facial droop, facial numbness, and left-sided neck pain for the past day.  EXAM: MRI HEAD WITHOUT CONTRAST  TECHNIQUE: Multiplanar, multiecho pulse sequences of the brain and surrounding structures were obtained without intravenous contrast.  COMPARISON:  Head CT 01/02/2009  FINDINGS: Images are mildly degraded by motion artifact despite repeating multiple sequences.  There is no acute infarct. Ventricles and sulci are normal for age. There is no evidence of intracranial hemorrhage, mass, midline shift,  or extra-axial fluid collection. No brain parenchymal signal abnormality is identified.  Orbits are unremarkable. Paranasal sinuses and mastoid air cells are clear. Major intracranial vascular flow voids are preserved. Distal right vertebral artery appears hypoplastic. Calvarium and scalp soft tissues are unremarkable.  IMPRESSION: No acute intracranial abnormality identified. Unremarkable appearance of the brain allowing for mild motion artifact.   Electronically Signed   By: Logan Bores   On: 07/24/2014 12:38    Microbiology: Recent Results (from the past 240 hour(s))  MRSA PCR Screening     Status: None   Collection Time: 07/24/14  3:54 PM  Result Value Ref Range Status   MRSA by PCR NEGATIVE NEGATIVE Final    Comment:        The GeneXpert MRSA Assay (FDA approved for NASAL specimens only), is one  component of a comprehensive MRSA colonization surveillance program. It is not intended to diagnose MRSA infection nor to guide or monitor treatment for MRSA infections.      Labs: Basic Metabolic Panel:  Recent Labs Lab 07/24/14 1127 07/25/14 0544  NA 141 139  K 4.4 4.0  CL 103 103  CO2 26 24  GLUCOSE 90 103*  BUN 13 13  CREATININE 0.88 0.99  CALCIUM 9.3 9.0   Liver Function Tests: No results for input(s): AST, ALT, ALKPHOS, BILITOT, PROT, ALBUMIN in the last 168 hours. No results for input(s): LIPASE, AMYLASE in the last 168 hours. No results for input(s): AMMONIA in the last 168 hours. CBC:  Recent Labs Lab 07/24/14 1127 07/25/14 0544  WBC 6.8 7.7  NEUTROABS 4.1  --   HGB 14.8 13.9  HCT 42.3 39.3  MCV 92.0 92.7  PLT 151 164   Cardiac Enzymes:  Recent Labs Lab 07/24/14 1127 07/24/14 1905 07/25/14 0002 07/25/14 0544  TROPONINI <0.30 <0.30 <0.30 <0.30   BNP: BNP (last 3 results) No results for input(s): PROBNP in the last 8760 hours. CBG: No results for input(s): GLUCAP in the last 168 hours.     Signed:  Danylle Ouk  [facility] 07/25/2014, 7:46 PM

## 2014-07-31 NOTE — Care Management Utilization Note (Signed)
UR completed 

## 2014-08-10 ENCOUNTER — Encounter: Payer: Self-pay | Admitting: Internal Medicine

## 2014-08-10 ENCOUNTER — Ambulatory Visit (INDEPENDENT_AMBULATORY_CARE_PROVIDER_SITE_OTHER): Payer: Self-pay | Admitting: Internal Medicine

## 2014-08-10 VITALS — BP 130/100 | HR 62 | Ht 64.0 in | Wt 168.0 lb

## 2014-08-10 DIAGNOSIS — Q211 Atrial septal defect, unspecified: Secondary | ICD-10-CM

## 2014-08-10 DIAGNOSIS — I10 Essential (primary) hypertension: Secondary | ICD-10-CM

## 2014-08-10 NOTE — Patient Instructions (Addendum)
We will call you when we have arrangements made for your heart cath   Today you will see patient assistance Adline Potter [phone]      Thank you for choosing [facility] !

## 2014-08-10 NOTE — Progress Notes (Addendum)
HPI  Patient is a 48 yo who is referred for evaluation of ASD The patinet was recently presented to [facility] ER with L sided facial weakness.  BP severely elevated at time. Symptoms resolved.  Seen by neurology  MRI negative Felt to represent hypertensive urgengeny  Echo done while admitted showed ASD with L to R flow  R sided chamberse enlarged.  The patinet was referred for further evaluation. ON talking to patinet her facial symptoms have resolved.  She says she was told it was Bell's palsy  She denies CP  No signif SOB  No dizzinss.    Allergies  Allergen Reactions  . Erythromycin Nausea And Vomiting and Rash    Current Outpatient Prescriptions  Medication Sig Dispense Refill  . aspirin 325 MG tablet Take 325 mg by mouth daily as needed for moderate pain.    Marland Kitchen lisinopril (PRINIVIL,ZESTRIL) 20 MG tablet Take 1 tablet (20 mg total) by mouth daily. 30 tablet 1  . OVER THE COUNTER MEDICATION Place 1 drop into both eyes daily as needed (irritation).     No current facility-administered medications for this visit.    Past Medical History  Diagnosis Date  . Hypertension   . Heart murmur     Past Surgical History  Procedure Laterality Date  . Cholecystectomy      No family history on file.  History   Social History  . Marital Status: Legally Separated    Spouse Name: N/A    Number of Children: N/A  . Years of Education: N/A   Occupational History  . Not on file.   Social History Main Topics  . Smoking status: Current Every Day Smoker -- 1.00 packs/day    Types: Cigarettes  . Smokeless tobacco: Never Used  . Alcohol Use: 0.0 oz/week    0 Not specified per week     Comment: once a week  . Drug Use: No  . Sexual Activity: Yes    Birth Control/ Protection: None   Other Topics Concern  . Not on file   Social History Narrative    Review of Systems:  All systems reviewed.  They are negative to the above problem except as previously stated.  Vital Signs: BP 130/100  mmHg  Pulse 62  Ht 5\' 4"  (1.626 m)  Wt 168 lb (76.204 kg)  BMI 28.82 kg/m2  LMP 04/23/2014  Physical Exam Patient is in NAD   HEENT:  Normocephalic, atraumatic. EOMI, PERRLA.  Neck: JVP is normal.  No bruits.  Lungs: clear to auscultation. No rales no wheezes.  Heart: Regular rate and rhythm. Normal S1, S2 Split. No S3.  Gr II/VI systolic murmur LUSB  RV impulse is increased  Abdomen:  Supple, nontender. Normal bowel sounds. No masses. No hepatomegaly.  Extremities:   Good distal pulses throughout. No lower extremity edema.  Musculoskeletal :moving all extremities.  Neuro:   alert and oriented x3.  CN II-XII grossly intact.  EKG SR  LA abnormality  Incomp RBBB  Assessment and Plan:  Patient is a 48 yo who presents for evaluation of ASD  I have reviewed echo  Somewhat difficult images but it does appear to be fairly large  R sided chambers are enlarged.  Not able to access got TR jet to determine PA pressure but images suggest RV volume / pressure overload Discussed R heart cath with patinet  She has no insurance  Will need to check with social work re coverage  2.  HTN  BP on  my check 140/88  Follow  Continue meds    3.  Neuro.  Symptoms concerning  Have resolved.      11/25/14. Patient will be set up for R and L heart catheterization to evaluate ASD size and whether patient would be candidate for repair  Dorris Carnes

## 2014-09-01 ENCOUNTER — Telehealth: Payer: Self-pay

## 2014-09-01 NOTE — Telephone Encounter (Signed)
error 

## 2014-11-17 ENCOUNTER — Telehealth: Payer: Self-pay

## 2014-11-17 NOTE — Telephone Encounter (Signed)
Any workd back from patient or [employer]. She needs cath     ----- Message -----     From: Massie Maroon, CMA     Sent: 09/01/2014  2:16 PM      To: [name], MD        We have left message with Adline Potter regarding payment options. She was notified that pt needed heart cath when seen [place] 11/23.     ----- Message -----     From: [name], MD     Sent: 09/01/2014  2:07 PM      To: Bernita Raisin, RN        Patinet needs a R and prob L heart cath Has large ASD    WHen I saw I thought Social work was going to look into payment options         ----- Message -----     From: [name], MD     Sent: 08/10/2014 10:39 PM      To: [name], MD                I spoke with patient,she has to arrange transportation with her Uncle who is the only person who can drive her to [facility].She is tentatively she is looking at mid March.She tells me she is going to call me back on Wednesday March 3rd

## 2014-11-19 ENCOUNTER — Telehealth: Payer: Self-pay

## 2014-11-19 NOTE — Telephone Encounter (Signed)
FYI   Dr Harrington Challenger:  Right and possible left heart cath scheduled with Dr Burt Knack on 3/11 at 10:30 am  Please write orders for labs AND  chest x-ray to be done that morning as she has no transportation to get to outside lab. Her Meghan Simmons is her ride and this is the only date and time she can do it    Spoke with patient and also mailed her cath instructions   Will forward to Dr Harrington Challenger

## 2014-11-24 ENCOUNTER — Other Ambulatory Visit (HOSPITAL_COMMUNITY): Payer: Self-pay | Admitting: Nurse Practitioner

## 2014-11-24 DIAGNOSIS — Z1231 Encounter for screening mammogram for malignant neoplasm of breast: Secondary | ICD-10-CM

## 2014-11-25 NOTE — Telephone Encounter (Signed)
Patient ws seen in November in clinic   I made addendum to H and P (brief) Check to see if this is adequate

## 2014-11-27 ENCOUNTER — Ambulatory Visit (HOSPITAL_COMMUNITY)
Admission: RE | Admit: 2014-11-27 | Discharge: 2014-11-27 | Disposition: A | Discharge status: Home / Self Care | Payer: Self-pay | Source: Ambulatory Visit | Attending: Cardiovascular Disease | Admitting: Cardiovascular Disease

## 2014-11-27 ENCOUNTER — Encounter (HOSPITAL_COMMUNITY)
Admission: AD | Disposition: A | Discharge status: Home / Self Care | Payer: Self-pay | Source: Ambulatory Visit | Attending: Internal Medicine

## 2014-11-27 ENCOUNTER — Ambulatory Visit (HOSPITAL_COMMUNITY)
Admission: AD | Admit: 2014-11-27 | Discharge: 2014-11-27 | Disposition: A | Discharge status: Home / Self Care | Payer: Self-pay | Source: Ambulatory Visit | Attending: Internal Medicine | Admitting: Internal Medicine

## 2014-11-27 ENCOUNTER — Encounter (HOSPITAL_COMMUNITY)
Admission: RE | Disposition: A | Discharge status: Home / Self Care | Payer: Self-pay | Source: Ambulatory Visit | Attending: Cardiovascular Disease

## 2014-11-27 ENCOUNTER — Encounter (HOSPITAL_COMMUNITY): Payer: Self-pay | Admitting: *Deleted

## 2014-11-27 DIAGNOSIS — Z881 Allergy status to other antibiotic agents status: Secondary | ICD-10-CM | POA: Insufficient documentation

## 2014-11-27 DIAGNOSIS — R531 Weakness: Secondary | ICD-10-CM | POA: Insufficient documentation

## 2014-11-27 DIAGNOSIS — Q211 Atrial septal defect, unspecified: Secondary | ICD-10-CM

## 2014-11-27 DIAGNOSIS — F17210 Nicotine dependence, cigarettes, uncomplicated: Secondary | ICD-10-CM | POA: Insufficient documentation

## 2014-11-27 DIAGNOSIS — Z9049 Acquired absence of other specified parts of digestive tract: Secondary | ICD-10-CM | POA: Insufficient documentation

## 2014-11-27 DIAGNOSIS — Z7982 Long term (current) use of aspirin: Secondary | ICD-10-CM | POA: Insufficient documentation

## 2014-11-27 DIAGNOSIS — I10 Essential (primary) hypertension: Secondary | ICD-10-CM | POA: Insufficient documentation

## 2014-11-27 HISTORY — PX: TEE WITHOUT CARDIOVERSION: SHX5443

## 2014-11-27 SURGERY — LEFT AND RIGHT HEART CATHETERIZATION WITH CORONARY ANGIOGRAM

## 2014-11-27 SURGERY — ECHOCARDIOGRAM, TRANSESOPHAGEAL
Anesthesia: Moderate Sedation

## 2014-11-27 MED ORDER — DIPHENHYDRAMINE HCL 50 MG/ML IJ SOLN
INTRAMUSCULAR | Status: AC
  Filled 2014-11-27: qty 1

## 2014-11-27 MED ORDER — BUTAMBEN-TETRACAINE-BENZOCAINE 2-2-14 % EX AERO
INHALATION_SPRAY | CUTANEOUS | Status: DC | PRN
  Administered 2014-11-27: 1 via TOPICAL

## 2014-11-27 MED ORDER — SODIUM CHLORIDE 0.9 % IV SOLN: INTRAVENOUS | Status: DC

## 2014-11-27 MED ORDER — LIDOCAINE VISCOUS 2 % MT SOLN
OROMUCOSAL | Status: AC
  Filled 2014-11-27: qty 15

## 2014-11-27 MED ORDER — FENTANYL CITRATE 0.05 MG/ML IJ SOLN
INTRAMUSCULAR | Status: AC
  Filled 2014-11-27: qty 2

## 2014-11-27 MED ORDER — MIDAZOLAM HCL 5 MG/ML IJ SOLN
INTRAMUSCULAR | Status: AC
  Filled 2014-11-27: qty 2

## 2014-11-27 MED ORDER — DIPHENHYDRAMINE HCL 50 MG/ML IJ SOLN
INTRAMUSCULAR | Status: DC | PRN
  Administered 2014-11-27 (×2): 12.5 mg via INTRAVENOUS
  Administered 2014-11-27: 25 mg via INTRAVENOUS

## 2014-11-27 MED ORDER — FENTANYL CITRATE 0.05 MG/ML IJ SOLN
INTRAMUSCULAR | Status: DC | PRN
  Administered 2014-11-27 (×4): 12.5 ug via INTRAVENOUS

## 2014-11-27 MED ORDER — LIDOCAINE VISCOUS 2 % MT SOLN
OROMUCOSAL | Status: DC | PRN
  Administered 2014-11-27: 1 via OROMUCOSAL

## 2014-11-27 MED ORDER — MIDAZOLAM HCL 10 MG/2ML IJ SOLN
INTRAMUSCULAR | Status: DC | PRN
  Administered 2014-11-27: 1 mg via INTRAVENOUS
  Administered 2014-11-27 (×2): 2 mg via INTRAVENOUS
  Administered 2014-11-27 (×2): 1 mg via INTRAVENOUS

## 2014-11-27 NOTE — Op Note (Signed)
TEE  See full echo report in CV section.

## 2014-11-27 NOTE — H&P (Signed)
  H and P    Patient is a 49 yo who is referred for evaluation of ASD The patinet was recently presented to [facility] ER with L sided facial weakness. BP severely elevated at time. Symptoms resolved. Seen by neurology MRI negative Felt to represent hypertensive urgengeny Echo done while admitted showed ASD with L to R flow R sided chamberse enlarged. The patinet was referred for further evaluation. I sw her in clinic in November    Allergies  Allergen Reactions  . Erythromycin Nausea And Vomiting and Rash    Current Outpatient Prescriptions  Medication Sig Dispense Refill  . aspirin 325 MG tablet Take 325 mg by mouth daily as needed for moderate pain.    Marland Kitchen lisinopril (PRINIVIL,ZESTRIL) 20 MG tablet Take 1 tablet (20 mg total) by mouth daily. 30 tablet 1  . OVER THE COUNTER MEDICATION Place 1 drop into both eyes daily as needed (irritation).     No current facility-administered medications for this visit.    Past Medical History  Diagnosis Date  . Hypertension   . Heart murmur     Past Surgical History  Procedure Laterality Date  . Cholecystectomy      No family history on file.  History   Social History  . Marital Status: Legally Separated    Spouse Name: N/A    Number of Children: N/A  . Years of Education: N/A   Occupational History  . Not on file.   Social History Main Topics  . Smoking status: Current Every Day Smoker -- 1.00 packs/day    Types: Cigarettes  . Smokeless tobacco: Never Used  . Alcohol Use: 0.0 oz/week    0 Not specified per week     Comment: once a week  . Drug Use: No  . Sexual Activity: Yes    Birth Control/ Protection: None   Other Topics Concern  . Not on file   Social History Narrative    Review of Systems: All systems reviewed. They are negative to the above problem except as previously stated.  Vital  Signs: BP 96/66  P 66 Physical Exam Patient is in NAD  HEENT: Normocephalic, atraumatic. EOMI, PERRLA.  Neck: JVP is normal. No bruits.  Lungs: clear to auscultation. No rales no wheezes.  Heart: Regular rate and rhythm. Normal S1, S2 Split. No S3. Gr II/VI systolic murmur LUSB RV impulse is increased Abdomen: Supple, nontender. Normal bowel sounds. No masses. No hepatomegaly.  Extremities: Good distal pulses throughout. No lower extremity edema.  Musculoskeletal :moving all extremities.  Neuro: alert and oriented x3. CN II-XII grossly intact.    Assessment and Plan:  Meghan Simmons

## 2014-11-27 NOTE — Progress Notes (Signed)
  Echocardiogram Echocardiogram Transesophageal has been performed.  Philipp Deputy 11/27/2014, 3:42 PM

## 2014-11-27 NOTE — Discharge Instructions (Signed)
Conscious Sedation, Adult, Care After °Refer to this sheet in the next few weeks. These instructions provide you with information on caring for yourself after your procedure. Your health care provider may also give you more specific instructions. Your treatment has been planned according to current medical practices, but problems sometimes occur. Call your health care provider if you have any problems or questions after your procedure. °WHAT TO EXPECT AFTER THE PROCEDURE  °After your procedure: °· You may feel sleepy, clumsy, and have poor balance for several hours. °· Vomiting may occur if you eat too soon after the procedure. °HOME CARE INSTRUCTIONS °· Do not participate in any activities where you could become injured for at least 24 hours. Do not: °¨ Drive. °¨ Swim. °¨ Ride a bicycle. °¨ Operate heavy machinery. °¨ Cook. °¨ Use power tools. °¨ Climb ladders. °¨ Work from a high place. °· Do not make important decisions or sign legal documents until you are improved. °· If you vomit, drink water, juice, or soup when you can drink without vomiting. Make sure you have little or no nausea before eating solid foods. °· Only take over-the-counter or prescription medicines for pain, discomfort, or fever as directed by your health care provider. °· Make sure you and your family fully understand everything about the medicines given to you, including what side effects may occur. °· You should not drink alcohol, take sleeping pills, or take medicines that cause drowsiness for at least 24 hours. °· If you smoke, do not smoke without supervision. °· If you are feeling better, you may resume normal activities 24 hours after you were sedated. °· Keep all appointments with your health care provider. °SEEK MEDICAL CARE IF: °· Your skin is pale or bluish in color. °· You continue to feel nauseous or vomit. °· Your pain is getting worse and is not helped by medicine. °· You have bleeding or swelling. °· You are still sleepy or  feeling clumsy after 24 hours. °SEEK IMMEDIATE MEDICAL CARE IF: °· You develop a rash. °· You have difficulty breathing. °· You develop any type of allergic problem. °· You have a fever. °MAKE SURE YOU: °· Understand these instructions. °· Will watch your condition. °· Will get help right away if you are not doing well or get worse. °Document Released: 06/25/2013 Document Reviewed: 06/25/2013 °ExitCare® Patient Information ©2015 [employer]. This information is not intended to replace advice given to you by your health care provider. Make sure you discuss any questions you have with your health care provider. °Transesophageal Echocardiogram °Transesophageal echocardiography (TEE) is a picture test of your heart using sound waves. The pictures taken can give very detailed pictures of your heart. This can help your doctor see if there are problems with your heart. TEE can check: °· If your heart has blood clots in it. °· How well your heart valves are working. °· If you have an infection on the inside of your heart. °· Some of the major arteries of your heart. °· If your heart valve is working after a repair. °· Your heart before a procedure that uses a shock to your heart to get the rhythm back to normal. °BEFORE THE PROCEDURE °· Do not eat or drink for 6 hours before the procedure or as told by your doctor. °· Make plans to have someone drive you home after the procedure. Do not drive yourself home. °· An IV tube will be put in your arm. °PROCEDURE °· You will be given a   medicine to help you relax (sedative). It will be given through the IV tube. °· A numbing medicine will be sprayed or gargled in the back of your throat to help numb it. °· The tip of the probe is placed into the back of your mouth. You will be asked to swallow. This helps to pass the probe into your esophagus. °· Once the tip of the probe is in the right place, your doctor can take pictures of your heart. °· You may feel pressure at the back of  your throat. °AFTER THE PROCEDURE °· You will be taken to a recovery area so the sedative can wear off. °· Your throat may be sore and scratchy. This will go away slowly over time. °· You will go home when you are fully awake and able to swallow liquids. °· You should have someone stay with you for the next 24 hours. °· Do not drive or operate machinery for the next 24 hours. °Document Released: 07/02/2009 Document Revised: 09/09/2013 Document Reviewed: 03/06/2013 °ExitCare® Patient Information ©2015 [employer]. This information is not intended to replace advice given to you by your health care provider. Make sure you discuss any questions you have with your health care provider. ° °

## 2014-11-30 ENCOUNTER — Encounter (HOSPITAL_COMMUNITY): Payer: Self-pay | Admitting: Internal Medicine

## 2014-12-21 ENCOUNTER — Telehealth: Payer: Self-pay | Admitting: *Deleted

## 2014-12-21 DIAGNOSIS — Q211 Atrial septal defect, unspecified: Secondary | ICD-10-CM

## 2014-12-21 NOTE — Telephone Encounter (Signed)
Per Dr. Harrington Challenger, pt needs cardiac MRI 01/04/15 for eval of ASD and pulm veins with Dr. Meda Coffee to read.  Order placed. Staff message to [facility] to schedule.  Will need f/u with Dr. Burt Knack.

## 2014-12-24 ENCOUNTER — Encounter: Payer: Self-pay | Admitting: Internal Medicine

## 2014-12-24 NOTE — Telephone Encounter (Signed)
Patient's scheduled for 4/20 for cardiac MRI Called patient to make sure this date will work for her. She was unaware of the appointment and states she cannot get a ride this day. She will be able to come on 4/18 and 4/27. Left message for [facility] (scheduler) to call me to try to get rescheduled.  Pt is aware that she needs blood work with in one week prior to the MRI.   She has appt at [facility] Dept on 4/14 and can get it done there.  I will mail her a prescription for BMET to her home.  Pt is aware we are continuing to work to have this rescheduled and I will call her back with update.

## 2014-12-28 NOTE — Telephone Encounter (Signed)
Sent staff message back to [facility] to change patient's appointment for cardiac MRI to 4/18 or 4/27.

## 2014-12-31 ENCOUNTER — Encounter: Payer: Self-pay | Admitting: Internal Medicine

## 2015-01-06 ENCOUNTER — Other Ambulatory Visit (HOSPITAL_COMMUNITY): Payer: Self-pay

## 2015-01-13 ENCOUNTER — Ambulatory Visit (HOSPITAL_COMMUNITY): Payer: Self-pay

## 2015-01-18 ENCOUNTER — Other Ambulatory Visit (HOSPITAL_COMMUNITY)
Admission: RE | Admit: 2015-01-18 | Discharge: 2015-01-18 | Disposition: A | Discharge status: Home / Self Care | Payer: Self-pay | Source: Ambulatory Visit | Attending: Internal Medicine | Admitting: Internal Medicine

## 2015-01-18 ENCOUNTER — Telehealth: Payer: Self-pay | Admitting: Internal Medicine

## 2015-01-18 DIAGNOSIS — R0602 Shortness of breath: Secondary | ICD-10-CM | POA: Insufficient documentation

## 2015-01-18 LAB — BASIC METABOLIC PANEL
Anion gap: 7 (ref 5–15)
BUN: 11 mg/dL (ref 6–20)
CHLORIDE: 103 mmol/L (ref 101–111)
CO2: 28 mmol/L (ref 22–32)
CREATININE: 0.85 mg/dL (ref 0.44–1.00)
Calcium: 9.1 mg/dL (ref 8.9–10.3)
GFR calc non Af Amer: >60 mL/min (ref >60)
Glucose, Bld: 89 mg/dL (ref 70–99)
Potassium: 4 mmol/L (ref 3.5–5.1)
SODIUM: 138 mmol/L (ref 135–145)

## 2015-01-18 NOTE — Telephone Encounter (Signed)
MRI is this Friday. Patient is wanting something for her anxiety. Said that she has panic attacks while in the machine

## 2015-01-18 NOTE — Telephone Encounter (Signed)
Will forward to Dr. Ross.  

## 2015-01-18 NOTE — Telephone Encounter (Signed)
Patient wants to know where she needs to go to get CCR checked.

## 2015-01-18 NOTE — Telephone Encounter (Signed)
Patient states she will come today for blood work

## 2015-01-18 NOTE — Telephone Encounter (Signed)
Slip up This needs to be a cardiac CT  Sched on day when [name] is working at hospital so that he can see patient  Meghan Simmons to read out CT scan Cancel MRI

## 2015-01-19 ENCOUNTER — Telehealth: Payer: Self-pay | Admitting: *Deleted

## 2015-01-19 DIAGNOSIS — Q211 Atrial septal defect, unspecified: Secondary | ICD-10-CM

## 2015-01-19 NOTE — Telephone Encounter (Signed)
MRI cancelled,pt is available the following dates in May :11,12,16,17,20,31 st  Meghan Simmons to assist me to arrange with Dr York Cerise schedule

## 2015-01-19 NOTE — Telephone Encounter (Signed)
Notes Recorded by [name], MD on 01/18/2015 at 11:13 PM BMET is OK  Needs to be done before cardiac CT   Again, d/c MRI Sched cardiac CT on day Meghan Simmons is in hosp Please let me know when date is selected May be able to be done fri if he is there    [place].

## 2015-01-21 ENCOUNTER — Other Ambulatory Visit: Payer: Self-pay | Admitting: *Deleted

## 2015-01-21 NOTE — Telephone Encounter (Signed)
Cardiac CT scheduled for 02/16/15 11:00 Dr. Meda Coffee to read. Dr. Burt Knack to see afterward.

## 2015-01-21 NOTE — Telephone Encounter (Signed)
Cardiac CT scheduled for Meghan Simmons on Tuesday May 31 st at 11 am. Patient to arrive at 10:45 am, NPO 4 hrs prior.She has been instructed to stay in radiology waiting room until seen by Dr Burt Knack per Dr Alan Ripper message.Dr Meda Coffee has ben requested to read CT   I will forward this note to both Dr Burt Knack and Dr Harrington Challenger

## 2015-01-22 ENCOUNTER — Ambulatory Visit (HOSPITAL_COMMUNITY): Payer: Self-pay

## 2015-01-26 ENCOUNTER — Ambulatory Visit (HOSPITAL_COMMUNITY): Payer: Self-pay

## 2015-02-01 ENCOUNTER — Ambulatory Visit (HOSPITAL_COMMUNITY): Payer: Self-pay

## 2015-02-16 ENCOUNTER — Ambulatory Visit (HOSPITAL_COMMUNITY)
Admission: RE | Admit: 2015-02-16 | Discharge: 2015-02-16 | Disposition: A | Discharge status: Home / Self Care | Payer: Self-pay | Source: Ambulatory Visit | Attending: Internal Medicine | Admitting: Internal Medicine

## 2015-02-16 ENCOUNTER — Encounter (HOSPITAL_COMMUNITY): Payer: Self-pay

## 2015-02-16 DIAGNOSIS — I2510 Atherosclerotic heart disease of native coronary artery without angina pectoris: Secondary | ICD-10-CM | POA: Insufficient documentation

## 2015-02-16 DIAGNOSIS — Q211 Atrial septal defect, unspecified: Secondary | ICD-10-CM

## 2015-02-16 MED ORDER — IOHEXOL 350 MG/ML SOLN
80.0000 mL | Freq: Once | INTRAVENOUS | Status: AC | PRN
  Administered 2015-02-16: 100 mL via INTRAVENOUS

## 2015-02-16 MED ORDER — NITROGLYCERIN 0.4 MG SL SUBL
0.4000 mg | SUBLINGUAL_TABLET | SUBLINGUAL | Status: DC | PRN
  Administered 2015-02-16: 0.4 mg via SUBLINGUAL
  Filled 2015-02-16 (×2): qty 25

## 2015-02-16 MED ORDER — METOPROLOL TARTRATE 1 MG/ML IV SOLN
5.0000 mg | Freq: Once | INTRAVENOUS | Status: AC
  Administered 2015-02-16: 5 mg via INTRAVENOUS
  Filled 2015-02-16: qty 5

## 2015-02-16 MED ORDER — NITROGLYCERIN 0.4 MG SL SUBL
SUBLINGUAL_TABLET | SUBLINGUAL | Status: AC
  Filled 2015-02-16: qty 1

## 2015-02-16 MED ORDER — METOPROLOL TARTRATE 1 MG/ML IV SOLN
INTRAVENOUS | Status: AC
  Filled 2015-02-16: qty 5

## 2015-02-24 ENCOUNTER — Ambulatory Visit (HOSPITAL_COMMUNITY): Payer: Self-pay

## 2015-02-26 NOTE — Patient Instructions (Signed)
Atrial Septal Defect An atrial septal defect (ASD) is a hole in the heart. This hole is located in the thin tissue (septum) that separates the two upper chambers of the heart, the right and left atrium. This hole is present at birth (congenital). A few minutes after birth, this hole normally closes so that blood is not able to go between the right and left atrium.  Normally, blood from the right side of the heart is pumped to the lungs where the blood is oxygenated. The oxygenated blood from the lungs is then pumped to the left side of the heart. From the left side of the heart, blood is pumped out to the rest of the body. When an ASD occurs, blood from the left atrium mixes with blood in the right atrium. The blood is then recirculated to the lungs and left side of the heart. In other words, the blood makes the trip twice. An ASD makes the heart work harder by increasing the amount of blood in the right side of the heart. This causes heart overload and eventually weakens the heart's ability to pump.  CAUSES  The cause of ASD is not known. SIGNS AND SYMPTOMS  The symptoms of ASD vary depending upon the size of the hole and the amount of blood that goes into the right atrium. There may be no symptoms or symptoms may include:  Tiredness or fatigue.  Trouble breathing or shortness of breath.  Irregular heartbeats (arrhythmias).  An extra "swishing" or "whooshing" type sound (heart murmur) heard when listening to the heart. DIAGNOSIS  In order to diagnose ASD, tests will need to be performed. Some of the tests may include:  ECG. This records the electrical activity of your heart and traces the patterns of your heartbeat.  Chest X-ray exam.  MRI or CT scan.  Nuclear medicine blood flow study. This imaging test shows how much blood is being passed through the ASD.  Echocardiography. There are two types that may be used:  Transthoracic echocardiography (TTE). A TTE is very sensitive for  detecting the two most common types of ASD, ostium primum or ostium secundum. It is not as sensitive in detecting a less common form of ASD, sinus venosus.  Transesophageal echocardiography (TEE). A TEE is especially helpful in patients who have a thin or easily movable (mobile) septum, making ASD detection more accurate.  Cardiac catheterization. In this procedure, a small tube (catheter) is passed through a large vein in your neck, groin, or arm. With this test, your health care provider can visualize your heart defect, check how well your heart is pumping, and check the function of your heart valves. TREATMENT   No treatment may be required if only a small amount of blood is moving back and forth (shunting) from the left to right atrium.  Minimally invasive closure may be done depending on the type and location of the ASD. Similar to the cardiac catheterization used to diagnose an ASD, this type of procedure is done in a cardiac catheterization lab. A catheter is inserted into a large blood vessel. The catheter is advanced to the ASD in the heart. A patch resembling an umbrella is threaded up the catheter and placed in the ASD hole. The patch is then "opened up" to close off the hole.  Open heart surgery may be necessary if minimally invasive closure cannot be done. If the ASD is small, the hole can be closed with stitches. If the ASD is large, a patch is sewn  over the defect so the hole is closed. SEEK IMMEDIATE MEDICAL CARE IF:  You experience unusual fatigue when exerting yourself.  You have chest pain at rest or with exertion.  You notice your fingertips or lips turning pale or blue. MAKE SURE YOU:   Understand these instructions.   Will watch your condition.  Will get help right away if you are not doing well or get worse.  Document Released: 04/18/2004 Document Revised: 01/19/2014 Document Reviewed: 05/12/2013 [facility] Patient Information 2015 [place], [place]. This information  is not intended to replace advice given to you by your health care provider. Make sure you discuss any questions you have with your health care provider.    Heart Failure Heart failure is a condition in which the heart has trouble pumping blood. This means your heart does not pump blood efficiently for your body to work well. In some cases of heart failure, fluid may back up into your lungs or you may have swelling (edema) in your lower legs. Heart failure is usually a long-term (chronic) condition. It is important for you to take good care of yourself and follow your health care provider's treatment plan. CAUSES  Some health conditions can cause heart failure. Those health conditions include:  High blood pressure (hypertension). Hypertension causes the heart muscle to work harder than normal. When pressure in the blood vessels is high, the heart needs to pump (contract) with more force in order to circulate blood throughout the body. High blood pressure eventually causes the heart to become stiff and weak.  Coronary artery disease (CAD). CAD is the buildup of cholesterol and fat (plaque) in the arteries of the heart. The blockage in the arteries deprives the heart muscle of oxygen and blood. This can cause chest pain and may lead to a heart attack. High blood pressure can also contribute to CAD.  Heart attack (myocardial infarction). A heart attack occurs when one or more arteries in the heart become blocked. The loss of oxygen damages the muscle tissue of the heart. When this happens, part of the heart muscle dies. The injured tissue does not contract as well and weakens the heart's ability to pump blood.  Abnormal heart valves. When the heart valves do not open and close properly, it can cause heart failure. This makes the heart muscle pump harder to keep the blood flowing.  Heart muscle disease (cardiomyopathy or myocarditis). Heart muscle disease is damage to the heart muscle from a variety of  causes. These can include drug or alcohol abuse, infections, or unknown reasons. These can increase the risk of heart failure.  Lung disease. Lung disease makes the heart work harder because the lungs do not work properly. This can cause a strain on the heart, leading it to fail.  Diabetes. Diabetes increases the risk of heart failure. High blood sugar contributes to high fat (lipid) levels in the blood. Diabetes can also cause slow damage to tiny blood vessels that carry important nutrients to the heart muscle. When the heart does not get enough oxygen and food, it can cause the heart to become weak and stiff. This leads to a heart that does not contract efficiently.  Other conditions can contribute to heart failure. These include abnormal heart rhythms, thyroid problems, and low blood counts (anemia). Certain unhealthy behaviors can increase the risk of heart failure, including:  Being overweight.  Smoking or chewing tobacco.  Eating foods high in fat and cholesterol.  Abusing illicit drugs or alcohol.  Lacking physical activity.  SYMPTOMS  Heart failure symptoms may vary and can be hard to detect. Symptoms may include:  Shortness of breath with activity, such as climbing stairs.  Persistent cough.  Swelling of the feet, ankles, legs, or abdomen.  Unexplained weight gain.  Difficulty breathing when lying flat (orthopnea).  Waking from sleep because of the need to sit up and get more air.  Rapid heartbeat.  Fatigue and loss of energy.  Feeling light-headed, dizzy, or close to fainting.  Loss of appetite.  Nausea.  Increased urination during the night (nocturia). DIAGNOSIS  A diagnosis of heart failure is based on your history, symptoms, physical examination, and diagnostic tests. Diagnostic tests for heart failure may include:  Echocardiography.  Electrocardiography.  Chest X-ray.  Blood tests.  Exercise stress test.  Cardiac angiography.  Radionuclide  scans. TREATMENT  Treatment is aimed at managing the symptoms of heart failure. Medicines, behavioral changes, or surgical intervention may be necessary to treat heart failure.  Medicines to help treat heart failure may include:  Angiotensin-converting enzyme (ACE) inhibitors. This type of medicine blocks the effects of a blood protein called angiotensin-converting enzyme. ACE inhibitors relax (dilate) the blood vessels and help lower blood pressure.  Angiotensin receptor blockers (ARBs). This type of medicine blocks the actions of a blood protein called angiotensin. Angiotensin receptor blockers dilate the blood vessels and help lower blood pressure.  Water pills (diuretics). Diuretics cause the kidneys to remove salt and water from the blood. The extra fluid is removed through urination. This loss of extra fluid lowers the volume of blood the heart pumps.  Beta blockers. These prevent the heart from beating too fast and improve heart muscle strength.  Digitalis. This increases the force of the heartbeat.  Healthy behavior changes include:  Obtaining and maintaining a healthy weight.  Stopping smoking or chewing tobacco.  Eating heart-healthy foods.  Limiting or avoiding alcohol.  Stopping illicit drug use.  Physical activity as directed by your health care provider.  Surgical treatment for heart failure may include:  A procedure to open blocked arteries, repair damaged heart valves, or remove damaged heart muscle tissue.  A pacemaker to improve heart muscle function and control certain abnormal heart rhythms.  An internal cardioverter defibrillator to treat certain serious abnormal heart rhythms.  A left ventricular assist device (LVAD) to assist the pumping ability of the heart. HOME CARE INSTRUCTIONS   Take medicines only as directed by your health care provider. Medicines are important in reducing the workload of your heart, slowing the progression of heart failure, and  improving your symptoms.  Do not stop taking your medicine unless directed by your health care provider.  Do not skip any dose of medicine.  Refill your prescriptions before you run out of medicine. Your medicines are needed every day.  Engage in moderate physical activity if directed by your health care provider. Moderate physical activity can benefit some people. The elderly and people with severe heart failure should consult with a health care provider for physical activity recommendations.  Eat heart-healthy foods. Food choices should be free of trans fat and low in saturated fat, cholesterol, and salt (sodium). Healthy choices include fresh or frozen fruits and vegetables, fish, lean meats, legumes, fat-free or low-fat dairy products, and whole grain or high fiber foods. Talk to a dietitian to learn more about heart-healthy foods.  Limit sodium if directed by your health care provider. Sodium restriction may reduce symptoms of heart failure in some people. Talk to a dietitian to learn  more about heart-healthy seasonings.  Use healthy cooking methods. Healthy cooking methods include roasting, grilling, broiling, baking, poaching, steaming, or stir-frying. Talk to a dietitian to learn more about healthy cooking methods.  Limit fluids if directed by your health care provider. Fluid restriction may reduce symptoms of heart failure in some people.  Weigh yourself every day. Daily weights are important in the early recognition of excess fluid. You should weigh yourself every morning after you urinate and before you eat breakfast. Wear the same amount of clothing each time you weigh yourself. Record your daily weight. Provide your health care provider with your weight record.  Monitor and record your blood pressure if directed by your health care provider.  Check your pulse if directed by your health care provider.  Lose weight if directed by your health care provider. Weight loss may reduce  symptoms of heart failure in some people.  Stop smoking or chewing tobacco. Nicotine makes your heart work harder by causing your blood vessels to constrict. Do not use nicotine gum or patches before talking to your health care provider.  Keep all follow-up visits as directed by your health care provider. This is important.  Limit alcohol intake to no more than 1 drink per day for nonpregnant women and 2 drinks per day for men. One drink equals 12 ounces of beer, 5 ounces of wine, or 1 ounces of hard liquor. Drinking more than that is harmful to your heart. Tell your health care provider if you drink alcohol several times a week. Talk with your health care provider about whether alcohol is safe for you. If your heart has already been damaged by alcohol or you have severe heart failure, drinking alcohol should be stopped completely.  Stop illicit drug use.  Stay up-to-date with immunizations. It is especially important to prevent respiratory infections through current pneumococcal and influenza immunizations.  Manage other health conditions such as hypertension, diabetes, thyroid disease, or abnormal heart rhythms as directed by your health care provider.  Learn to manage stress.  Plan rest periods when fatigued.  Learn strategies to manage high temperatures. If the weather is extremely hot:  Avoid vigorous physical activity.  Use air conditioning or fans or seek a cooler location.  Avoid caffeine and alcohol.  Wear loose-fitting, lightweight, and light-colored clothing.  Learn strategies to manage cold temperatures. If the weather is extremely cold:  Avoid vigorous physical activity.  Layer clothes.  Wear mittens or gloves, a hat, and a scarf when going outside.  Avoid alcohol.  Obtain ongoing education and support as needed.  Participate in or seek rehabilitation as needed to maintain or improve independence and quality of life. SEEK MEDICAL CARE IF:   Your weight  increases by 03 lb/1.4 kg in 1 day or 05 lb/2.3 kg in a week.  You have increasing shortness of breath that is unusual for you.  You are unable to participate in your usual physical activities.  You tire easily.  You cough more than normal, especially with physical activity.  You have any or more swelling in areas such as your hands, feet, ankles, or abdomen.  You are unable to sleep because it is hard to breathe.  You feel like your heart is beating fast (palpitations).  You become dizzy or light-headed upon standing up. SEEK IMMEDIATE MEDICAL CARE IF:   You have difficulty breathing.  There is a change in mental status such as decreased alertness or difficulty with concentration.  You have a pain or discomfort  in your chest.  You have an episode of fainting (syncope). MAKE SURE YOU:   Understand these instructions.  Will watch your condition.  Will get help right away if you are not doing well or get worse. Document Released: 09/04/2005 Document Revised: 01/19/2014 Document Reviewed: 10/04/2012 [facility] Patient Information 2015 [place], [place]. This information is not intended to replace advice given to you by your health care provider. Make sure you discuss any questions you have with your health care provider.

## 2015-03-01 ENCOUNTER — Ambulatory Visit (HOSPITAL_COMMUNITY)
Admission: RE | Admit: 2015-03-01 | Discharge: 2015-03-01 | Disposition: A | Discharge status: Home / Self Care | Payer: Self-pay | Source: Ambulatory Visit | Attending: Nurse Practitioner | Admitting: Nurse Practitioner

## 2015-03-01 DIAGNOSIS — Z1231 Encounter for screening mammogram for malignant neoplasm of breast: Secondary | ICD-10-CM

## 2015-04-02 ENCOUNTER — Telehealth: Payer: Self-pay | Admitting: Internal Medicine

## 2015-04-02 NOTE — Telephone Encounter (Signed)
Received Disability Determination Form sent to [facility] for completion.  04/02/15  ltd

## 2016-02-24 ENCOUNTER — Other Ambulatory Visit (HOSPITAL_COMMUNITY): Payer: Self-pay | Admitting: Nurse Practitioner

## 2016-02-24 DIAGNOSIS — Z1231 Encounter for screening mammogram for malignant neoplasm of breast: Secondary | ICD-10-CM

## 2016-02-28 ENCOUNTER — Ambulatory Visit (HOSPITAL_COMMUNITY)
Admission: RE | Admit: 2016-02-28 | Discharge: 2016-02-28 | Disposition: A | Discharge status: Home / Self Care | Payer: Self-pay | Source: Ambulatory Visit | Attending: Nurse Practitioner | Admitting: Nurse Practitioner

## 2016-02-28 DIAGNOSIS — Z1231 Encounter for screening mammogram for malignant neoplasm of breast: Secondary | ICD-10-CM

## 2016-09-08 ENCOUNTER — Encounter (HOSPITAL_COMMUNITY): Payer: Self-pay | Admitting: *Deleted

## 2016-09-08 ENCOUNTER — Emergency Department (HOSPITAL_COMMUNITY)
Admission: EM | Admit: 2016-09-08 | Discharge: 2016-09-08 | Disposition: A | Discharge status: Home / Self Care | Payer: Self-pay | Attending: Emergency Medicine | Admitting: Emergency Medicine

## 2016-09-08 DIAGNOSIS — L409 Psoriasis, unspecified: Secondary | ICD-10-CM

## 2016-09-08 DIAGNOSIS — Z7982 Long term (current) use of aspirin: Secondary | ICD-10-CM | POA: Insufficient documentation

## 2016-09-08 DIAGNOSIS — F17210 Nicotine dependence, cigarettes, uncomplicated: Secondary | ICD-10-CM | POA: Insufficient documentation

## 2016-09-08 DIAGNOSIS — I10 Essential (primary) hypertension: Secondary | ICD-10-CM | POA: Insufficient documentation

## 2016-09-08 DIAGNOSIS — B372 Candidiasis of skin and nail: Secondary | ICD-10-CM

## 2016-09-08 DIAGNOSIS — L308 Other specified dermatitis: Secondary | ICD-10-CM | POA: Insufficient documentation

## 2016-09-08 DIAGNOSIS — L401 Generalized pustular psoriasis: Secondary | ICD-10-CM | POA: Insufficient documentation

## 2016-09-08 MED ORDER — CALCITRIOL 3 MCG/GM EX OINT: 1.0000 "application " | TOPICAL_OINTMENT | Freq: Every day | CUTANEOUS | 1 refills | Status: DC

## 2016-09-08 MED ORDER — FLUCONAZOLE 150 MG PO TABS: 150.0000 mg | ORAL_TABLET | Freq: Once | ORAL | 0 refills | Status: AC

## 2016-09-08 MED ORDER — BETAMETHASONE DIPROPIONATE 0.05 % EX OINT: TOPICAL_OINTMENT | Freq: Two times a day (BID) | CUTANEOUS | 1 refills | Status: DC

## 2016-09-08 NOTE — ED Provider Notes (Signed)
[facility] DEPT Provider Note   CSN: [id number] Arrival date & time: 09/08/16  0703     History   Chief Complaint Chief Complaint  Patient presents with  . Rash    HPI Meghan Simmons is a 50 y.o. female.  HPI Patient has had a rash since November. She reports it itches and has spread over much of her body. She reports it started on her arms. She reports she has now also developed large areas of redness beneath both breasts and on her lower abdomen. Her doctor treated her with oral terbinafine but she has seen no improvement. The patient has not developed any lesions on her face head or neck. He reports lesions are both pruritic in the red areas are very uncomfortable. Past Medical History:  Diagnosis Date  . Heart murmur   . Hypertension     Patient Active Problem List   Diagnosis Date Noted  . ASD (atrial septal defect) 07/25/2014  . Hypertensive urgency 07/24/2014  . Facial droop 07/24/2014  . Tobacco use disorder 07/24/2014    Past Surgical History:  Procedure Laterality Date  . CHOLECYSTECTOMY    . TEE WITHOUT CARDIOVERSION N/A 11/27/2014   Procedure: TRANSESOPHAGEAL ECHOCARDIOGRAM (TEE);  Surgeon: [name], MD;  Location: [facility] ENDOSCOPY;  Service: Cardiovascular;  Laterality: N/A;    OB History    No data available       Home Medications    Prior to Admission medications   Medication Sig Start Date End Date Taking? Authorizing Provider  aspirin 325 MG tablet Take 325 mg by mouth daily as needed for moderate pain.    Historical Provider, MD  betamethasone dipropionate (DIPROLENE) 0.05 % ointment Apply topically 2 (two) times daily. 09/08/16   Charlesetta Shanks, MD  Calcitriol 3 MCG/GM cream Apply 1 application topically at bedtime. 09/08/16   Charlesetta Shanks, MD  fluconazole (DIFLUCAN) 150 MG tablet Take 1 tablet (150 mg total) by mouth once. Take second tablet one week later 09/08/16 09/08/16  Charlesetta Shanks, MD  lisinopril (PRINIVIL,ZESTRIL) 20 MG tablet  Take 1 tablet (20 mg total) by mouth daily. 07/25/14   Kathie Dike, MD    Family History History reviewed. No pertinent family history.  Social History Social History  Substance Use Topics  . Smoking status: Current Every Day Smoker    Packs/day: 1.00    Types: Cigarettes  . Smokeless tobacco: Never Used  . Alcohol use 0.0 oz/week     Comment: once a week     Allergies   Erythromycin   Review of Systems Review of Systems Constitutional: No documented fever or malaise. Restaurant: No difficulty breathing or cough.  Physical Exam Updated Vital Signs BP 112/94   Pulse 93 Comment: Simultaneous filing. User may not have seen previous data.  Temp 97.4 F (36.3 C) (Oral)   Resp 18   Ht 5\' 4"  (1.626 m)   Wt 200 lb (90.7 kg)   SpO2 100% Comment: Simultaneous filing. User may not have seen previous data.  BMI 34.33 kg/m   Physical Exam  Constitutional: She is oriented to person, place, and time.  Patient is alert and nontoxic. She is clinically well in appearance. No respiratory distress.  HENT:  Head: Normocephalic and atraumatic.  No lesions around the eyes or mouth. Face is free from any rash.  Neck:  No cervical lymphadenopathy neck is supple  Pulmonary/Chest: Effort normal.  Musculoskeletal: Normal range of motion. She exhibits no edema.  No peripheral edema of lower legs.  Neurological: She is alert and oriented to person, place, and time. She exhibits normal muscle tone. Coordination normal.  Skin:  Patient has extensive rash that however spares her face head and neck. No significant lesions on palms or soles. Patient has thick plaque lesions on her elbows and knees with silvery crust. Multiple smaller lesions estimated half a centimeter to 1 cm spread diffusely over the patient's arms and legs. Large plaque of moist erythema beneath each breast is otherwise against the abdominal skin. Large plaque of erythema over her lower abdomen and an intertriginous folds with  crusting, thick plaque overlying.     ED Treatments / Results  Labs (all labs ordered are listed, but only abnormal results are displayed) Labs Reviewed - No data to display  EKG  EKG Interpretation None       Radiology No results found.  Procedures Procedures (including critical care time)  Medications Ordered in ED Medications - No data to display   Initial Impression / Assessment and Plan / ED Course  I have reviewed the triage vital signs and the nursing notes.  Pertinent labs & imaging results that were available during my care of the patient were reviewed by me and considered in my medical decision making (see chart for details).  Clinical Course     Final Clinical Impressions(s) / ED Diagnoses   Final diagnoses:  Generalized psoriasis  Yeast dermatitis   Patient denies prior history of psoriasis. Her sister however has psoriasis. Patient has lesions which appear very classic for thick plaque psoriasis on her elbows and knees with smaller lesions diffusely over her extremities. Doubt this is acute only since November. Patient also has secondary Candida infection on contact skin beneath her breasts and lower abdomen folds. She is nontoxic and alert. Patient does not appear clinically unwell. She reports financial difficulty and does not feel she will be able to see a dermatologist. I have reviewed the complexity of treatment of extensive psoriasis with the patient and the importance of working with her primary care provider to find financial resources and possible referral for more complex treatment plan. At this time, I will initiate the patient with topical steroid and calcitriol. Patient is prescribed Diflucan for secondary yeast infection. New Prescriptions New Prescriptions   BETAMETHASONE DIPROPIONATE (DIPROLENE) 0.05 % OINTMENT    Apply topically 2 (two) times daily.   CALCITRIOL 3 MCG/GM CREAM    Apply 1 application topically at bedtime.   FLUCONAZOLE  (DIFLUCAN) 150 MG TABLET    Take 1 tablet (150 mg total) by mouth once. Take second tablet one week later     Charlesetta Shanks, MD 09/08/16 [phone]

## 2016-09-08 NOTE — ED Triage Notes (Signed)
Patient comes in with c/o rash generalized all over body. Patient states the rash started on both her arms. Rash is itchy and burning. Patient has not been to a dermatologist.

## 2016-09-12 ENCOUNTER — Encounter (HOSPITAL_COMMUNITY): Payer: Self-pay | Admitting: Emergency Medicine

## 2016-09-12 ENCOUNTER — Emergency Department (HOSPITAL_COMMUNITY)
Admission: EM | Admit: 2016-09-12 | Discharge: 2016-09-12 | Disposition: A | Discharge status: Home / Self Care | Payer: Self-pay | Attending: Dermatology | Admitting: Dermatology

## 2016-09-12 DIAGNOSIS — F17210 Nicotine dependence, cigarettes, uncomplicated: Secondary | ICD-10-CM | POA: Insufficient documentation

## 2016-09-12 DIAGNOSIS — Z7982 Long term (current) use of aspirin: Secondary | ICD-10-CM | POA: Insufficient documentation

## 2016-09-12 DIAGNOSIS — R21 Rash and other nonspecific skin eruption: Secondary | ICD-10-CM | POA: Insufficient documentation

## 2016-09-12 DIAGNOSIS — Z5321 Procedure and treatment not carried out due to patient leaving prior to being seen by health care provider: Secondary | ICD-10-CM | POA: Insufficient documentation

## 2016-09-12 DIAGNOSIS — I10 Essential (primary) hypertension: Secondary | ICD-10-CM | POA: Insufficient documentation

## 2016-09-12 NOTE — ED Triage Notes (Signed)
Pt returns to ED for rash that she states she has has since Nov. Pt was seen at [facility] 4 days ago for same. Pt reports she has tried medications without relief.

## 2016-09-12 NOTE — ED Triage Notes (Signed)
Pt called for room, no answer.

## 2016-09-12 NOTE — ED Notes (Signed)
Pt called at 2210

## 2016-09-13 ENCOUNTER — Observation Stay (HOSPITAL_COMMUNITY)
Admission: EM | Admit: 2016-09-13 | Discharge: 2016-09-14 | Disposition: A | Discharge status: Home / Self Care | Payer: Self-pay | Attending: Family Medicine | Admitting: Family Medicine

## 2016-09-13 ENCOUNTER — Encounter (HOSPITAL_COMMUNITY): Payer: Self-pay | Admitting: Cardiology

## 2016-09-13 ENCOUNTER — Emergency Department (HOSPITAL_COMMUNITY): Payer: Self-pay

## 2016-09-13 DIAGNOSIS — Z7982 Long term (current) use of aspirin: Secondary | ICD-10-CM | POA: Insufficient documentation

## 2016-09-13 DIAGNOSIS — Z79899 Other long term (current) drug therapy: Secondary | ICD-10-CM | POA: Insufficient documentation

## 2016-09-13 DIAGNOSIS — Z2089 Contact with and (suspected) exposure to other communicable diseases: Secondary | ICD-10-CM

## 2016-09-13 DIAGNOSIS — F17210 Nicotine dependence, cigarettes, uncomplicated: Secondary | ICD-10-CM | POA: Insufficient documentation

## 2016-09-13 DIAGNOSIS — F17200 Nicotine dependence, unspecified, uncomplicated: Secondary | ICD-10-CM | POA: Diagnosis present

## 2016-09-13 DIAGNOSIS — R0600 Dyspnea, unspecified: Secondary | ICD-10-CM

## 2016-09-13 DIAGNOSIS — R0602 Shortness of breath: Secondary | ICD-10-CM | POA: Insufficient documentation

## 2016-09-13 DIAGNOSIS — Z207 Contact with and (suspected) exposure to pediculosis, acariasis and other infestations: Secondary | ICD-10-CM

## 2016-09-13 DIAGNOSIS — L0390 Cellulitis, unspecified: Principal | ICD-10-CM | POA: Diagnosis present

## 2016-09-13 DIAGNOSIS — I160 Hypertensive urgency: Secondary | ICD-10-CM | POA: Insufficient documentation

## 2016-09-13 LAB — BASIC METABOLIC PANEL
ANION GAP: 10 (ref 5–15)
Anion gap: 13 (ref 5–15)
BUN: 20 mg/dL (ref 6–20)
BUN: 18 mg/dL (ref 6–20)
CHLORIDE: 98 mmol/L — AB (ref 101–111)
CO2: 23 mmol/L (ref 22–32)
CO2: 24 mmol/L (ref 22–32)
Calcium: 8.8 mg/dL — ABNORMAL LOW (ref 8.9–10.3)
Calcium: 8.5 mg/dL — ABNORMAL LOW (ref 8.9–10.3)
Chloride: 96 mmol/L — ABNORMAL LOW (ref 101–111)
Creatinine, Ser: 0.85 mg/dL (ref 0.44–1.00)
Creatinine, Ser: 0.79 mg/dL (ref 0.44–1.00)
GFR calc Af Amer: >60 mL/min (ref >60)
GFR calc non Af Amer: >60 mL/min (ref >60)
GFR calc non Af Amer: >60 mL/min (ref >60)
Glucose, Bld: 109 mg/dL — ABNORMAL HIGH (ref 65–99)
Glucose, Bld: 103 mg/dL — ABNORMAL HIGH (ref 65–99)
POTASSIUM: 4.2 mmol/L (ref 3.5–5.1)
Potassium: 4.1 mmol/L (ref 3.5–5.1)
Sodium: 132 mmol/L — ABNORMAL LOW (ref 135–145)
Sodium: 132 mmol/L — ABNORMAL LOW (ref 135–145)

## 2016-09-13 LAB — LACTIC ACID, PLASMA
Lactic Acid, Venous: 2.3 mmol/L (ref 0.5–1.9)
Lactic Acid, Venous: 2.3 mmol/L (ref 0.5–1.9)

## 2016-09-13 LAB — CBC WITH DIFFERENTIAL/PLATELET
Basophils Absolute: 0 10*3/uL (ref 0.0–0.1)
Basophils Relative: 0 %
Eosinophils Absolute: 0.1 10*3/uL (ref 0.0–0.7)
Eosinophils Relative: 0 %
HCT: 40.7 % (ref 36.0–46.0)
Hemoglobin: 13.6 g/dL (ref 12.0–15.0)
Lymphocytes Relative: 6 %
Lymphs Abs: 1 10*3/uL (ref 0.7–4.0)
MCH: 30.4 pg (ref 26.0–34.0)
MCHC: 33.4 g/dL (ref 30.0–36.0)
MCV: 90.8 fL (ref 78.0–100.0)
Monocytes Absolute: 0.9 10*3/uL (ref 0.1–1.0)
Monocytes Relative: 5 %
Neutro Abs: 16.7 10*3/uL — ABNORMAL HIGH (ref 1.7–7.7)
Neutrophils Relative %: 89 %
Platelets: 424 10*3/uL — ABNORMAL HIGH (ref 150–400)
RBC: 4.48 MIL/uL (ref 3.87–5.11)
RDW: 12.7 % (ref 11.5–15.5)
WBC: 18.7 10*3/uL — ABNORMAL HIGH (ref 4.0–10.5)

## 2016-09-13 MED ORDER — FENTANYL CITRATE (PF) 100 MCG/2ML IJ SOLN
50.0000 ug | INTRAMUSCULAR | Status: DC | PRN
  Administered 2016-09-13: 50 ug via INTRAVENOUS
  Filled 2016-09-13: qty 2

## 2016-09-13 MED ORDER — SODIUM CHLORIDE 0.9 % IV SOLN
INTRAVENOUS | Status: DC
  Administered 2016-09-13: 15:00:00 via INTRAVENOUS

## 2016-09-13 MED ORDER — OXYCODONE HCL 5 MG PO TABS
5.0000 mg | ORAL_TABLET | ORAL | Status: DC | PRN
  Administered 2016-09-13: 5 mg via ORAL
  Administered 2016-09-13: 10 mg via ORAL
  Filled 2016-09-13: qty 1
  Filled 2016-09-13: qty 2

## 2016-09-13 MED ORDER — ACETAMINOPHEN 650 MG RE SUPP
650.0000 mg | Freq: Four times a day (QID) | RECTAL | Status: DC | PRN
Start: 2016-09-13 — End: 2016-09-14

## 2016-09-13 MED ORDER — DIPHENHYDRAMINE HCL 50 MG/ML IJ SOLN
12.5000 mg | Freq: Once | INTRAMUSCULAR | Status: AC
Start: 2016-09-13 — End: 2016-09-13
  Administered 2016-09-13: 12.5 mg via INTRAVENOUS
  Filled 2016-09-13: qty 1

## 2016-09-13 MED ORDER — HYDROXYZINE HCL 25 MG PO TABS
25.0000 mg | ORAL_TABLET | Freq: Three times a day (TID) | ORAL | Status: DC | PRN
  Administered 2016-09-13: 25 mg via ORAL
  Filled 2016-09-13: qty 1

## 2016-09-13 MED ORDER — ACETAMINOPHEN 325 MG PO TABS: 650.0000 mg | ORAL_TABLET | Freq: Four times a day (QID) | ORAL | Status: DC | PRN

## 2016-09-13 MED ORDER — SODIUM CHLORIDE 0.9 % IV BOLUS (SEPSIS)
1000.0000 mL | Freq: Once | INTRAVENOUS | Status: AC
  Administered 2016-09-13: 1000 mL via INTRAVENOUS

## 2016-09-13 MED ORDER — CEFTRIAXONE SODIUM 2 G IJ SOLR
2.0000 g | INTRAMUSCULAR | Status: DC
  Administered 2016-09-13: 2 g via INTRAVENOUS
  Filled 2016-09-13 (×2): qty 2

## 2016-09-13 MED ORDER — HEPARIN SODIUM (PORCINE) 5000 UNIT/ML IJ SOLN
5000.0000 [IU] | Freq: Three times a day (TID) | INTRAMUSCULAR | Status: DC
  Administered 2016-09-13 – 2016-09-14 (×3): 5000 [IU] via SUBCUTANEOUS
  Filled 2016-09-13 (×2): qty 1

## 2016-09-13 MED ORDER — CITALOPRAM HYDROBROMIDE 20 MG PO TABS
10.0000 mg | ORAL_TABLET | Freq: Every day | ORAL | Status: DC
  Administered 2016-09-14: 10 mg via ORAL
  Filled 2016-09-13 (×2): qty 1

## 2016-09-13 MED ORDER — PERMETHRIN 5 % EX CREA
TOPICAL_CREAM | Freq: Once | CUTANEOUS | Status: AC
  Administered 2016-09-13: 14:00:00 via TOPICAL
  Filled 2016-09-13: qty 60

## 2016-09-13 MED ORDER — ASPIRIN 325 MG PO TABS: 325.0000 mg | ORAL_TABLET | Freq: Every day | ORAL | Status: DC | PRN

## 2016-09-13 MED ORDER — TRIAMCINOLONE ACETONIDE 0.5 % EX CREA
TOPICAL_CREAM | Freq: Two times a day (BID) | CUTANEOUS | Status: DC
  Administered 2016-09-13 (×2): via TOPICAL
  Filled 2016-09-13: qty 15

## 2016-09-13 MED ORDER — CEFTRIAXONE SODIUM 2 G IJ SOLR
2.0000 g | Freq: Once | INTRAMUSCULAR | Status: AC
  Administered 2016-09-13: 2 g via INTRAVENOUS
  Filled 2016-09-13: qty 2

## 2016-09-13 MED ORDER — CALCITRIOL 3 MCG/GM EX OINT: 1.0000 "application " | TOPICAL_OINTMENT | Freq: Every day | CUTANEOUS | Status: DC

## 2016-09-13 NOTE — H&P (Signed)
History and Physical    Meghan Simmons [id number] DOB: [birth year] DOA: 09/13/2016  PCP: [name], NP  Patient coming from: home  Chief Complaint: Rash and itching  HPI: Meghan Simmons is a 50 y.o. female with medical history significant of Psoriasis and recent exposure to scabies (untreated). Presented to the hospital complaining of  Worsening rash since November with itching. She does admit to a history of psoriasis which she has not been taking her medications for.  ED Course: Patient was found to have lactic acidosis, with elevated WBC count, and soft blood pressures. We are subsequently consulted for further evaluation of rash and further medical recommendations.  Review of Systems: As per HPI otherwise 10 point review of systems negative.    Past Medical History:  Diagnosis Date  . Heart murmur   . Hypertension     Past Surgical History:  Procedure Laterality Date  . CHOLECYSTECTOMY    . TEE WITHOUT CARDIOVERSION N/A 11/27/2014   Procedure: TRANSESOPHAGEAL ECHOCARDIOGRAM (TEE);  Surgeon: [name], MD;  Location: [facility] ENDOSCOPY;  Service: Cardiovascular;  Laterality: N/A;     reports that she has been smoking Cigarettes.  She has been smoking about 1.00 pack per day. She has never used smokeless tobacco. She reports that she drinks alcohol. She reports that she does not use drugs.  Allergies  Allergen Reactions  . Erythromycin Nausea And Vomiting and Rash   Family history: - none reported when asked directly   Prior to Admission medications   Medication Sig Start Date End Date Taking? Authorizing Provider  citalopram (CELEXA) 20 MG tablet Take 10 mg by mouth daily.   Yes Historical Provider, MD  fluconazole (DIFLUCAN) 150 MG tablet Take 150 mg by mouth as directed. Take 1 tablet by mouth once.Take second tablet one week later   Yes Historical Provider, MD  lisinopril (PRINIVIL,ZESTRIL) 20 MG tablet Take 1 tablet (20 mg total) by mouth daily. 07/25/14  Yes Kathie Dike, MD  aspirin 325 MG tablet Take 325 mg by mouth daily as needed for moderate pain.    Historical Provider, MD  betamethasone dipropionate (DIPROLENE) 0.05 % ointment Apply topically 2 (two) times daily. 09/08/16   Charlesetta Shanks, MD  Calcitriol 3 MCG/GM cream Apply 1 application topically at bedtime. 09/08/16   Charlesetta Shanks, MD    Physical Exam: Vitals:   09/13/16 0923 09/13/16 0930 09/13/16 1000 09/13/16 1030  BP:  (!) 83/47 (!) 88/49 (!) 81/44  Pulse:  91  80  Resp:  25 26 26   Temp: 97.9 F (36.6 C)     TempSrc: Rectal     SpO2:  99%  94%  Weight:      Height:          Constitutional: NAD, calm, comfortable Vitals:   09/13/16 0923 09/13/16 0930 09/13/16 1000 09/13/16 1030  BP:  (!) 83/47 (!) 88/49 (!) 81/44  Pulse:  91  80  Resp:  25 26 26   Temp: 97.9 F (36.6 C)     TempSrc: Rectal     SpO2:  99%  94%  Weight:      Height:       Eyes: PERRL, lids and conjunctivae normal ENMT: Mucous membranes are moist. Posterior pharynx clear of any exudate or lesions.  Neck: normal, supple, no masses, no thyromegaly Respiratory: clear to auscultation bilaterally, no wheezing, no crackles. Normal respiratory effort. No accessory muscle use.  Cardiovascular: Regular rate and rhythm, no murmurs / rubs / gallops. No  extremity edema. 2+ pedal pulses. No carotid bruits.  Abdomen: no tenderness, no masses palpated. No hepatosplenomegaly. Bowel sounds positive.  Musculoskeletal: no clubbing / cyanosis. No joint deformity upper and lower extremities. Good ROM, no contractures. Normal muscle tone.  Skin: Multiple rash over body with excoriations. Neurologic: CN 3-12 grossly intact. Sensation intact, DTR normal. Strength 5/5 in all 4.  Psychiatric: Normal judgment and insight. Alert and oriented x 3. Normal mood.    Labs on Admission: I have personally reviewed following labs and imaging studies  CBC:  Recent Labs Lab 09/13/16 0858  WBC 18.7*  NEUTROABS 16.7*  HGB 13.6  HCT  40.7  MCV 90.8  PLT 123456*   Basic Metabolic Panel:  Recent Labs Lab 09/13/16 0858  NA 132*  K 4.1  CL 96*  CO2 23  GLUCOSE 109*  BUN 20  CREATININE 0.85  CALCIUM 8.8*   GFR: Estimated Creatinine Clearance: 88.3 mL/min (by C-G formula based on SCr of 0.85 mg/dL). Liver Function Tests: No results for input(s): AST, ALT, ALKPHOS, BILITOT, PROT, ALBUMIN in the last 168 hours. No results for input(s): LIPASE, AMYLASE in the last 168 hours. No results for input(s): AMMONIA in the last 168 hours. Coagulation Profile: No results for input(s): INR, PROTIME in the last 168 hours. Cardiac Enzymes: No results for input(s): CKTOTAL, CKMB, CKMBINDEX, TROPONINI in the last 168 hours. BNP (last 3 results) No results for input(s): PROBNP in the last 8760 hours. HbA1C: No results for input(s): HGBA1C in the last 72 hours. CBG: No results for input(s): GLUCAP in the last 168 hours. Lipid Profile: No results for input(s): CHOL, HDL, LDLCALC, TRIG, CHOLHDL, LDLDIRECT in the last 72 hours. Thyroid Function Tests: No results for input(s): TSH, T4TOTAL, FREET4, T3FREE, THYROIDAB in the last 72 hours. Anemia Panel: No results for input(s): VITAMINB12, FOLATE, FERRITIN, TIBC, IRON, RETICCTPCT in the last 72 hours. Urine analysis:    Component Value Date/Time   COLORURINE YELLOW 07/24/2014 2155   APPEARANCEUR CLEAR 07/24/2014 2155   LABSPEC 1.010 07/24/2014 2155   PHURINE 6.5 07/24/2014 2155   GLUCOSEU NEGATIVE 07/24/2014 2155   HGBUR NEGATIVE 07/24/2014 2155   BILIRUBINUR NEGATIVE 07/24/2014 2155   KETONESUR NEGATIVE 07/24/2014 2155   PROTEINUR NEGATIVE 07/24/2014 2155   UROBILINOGEN 0.2 07/24/2014 2155   NITRITE NEGATIVE 07/24/2014 2155   LEUKOCYTESUR TRACE (A) 07/24/2014 2155   Sepsis Labs: !!!!!!!!!!!!!!!!!!!!!!!!!!!!!!!!!!!!!!!!!!!! @LABRCNTIP (procalcitonin:4,lacticidven:4) )No results found for this or any previous visit (from the past 240 hour(s)).   Radiological Exams on  Admission: Dg Chest Port 1 View  Result Date: 09/13/2016 CLINICAL DATA:  Dyspnea. EXAM: PORTABLE CHEST 1 VIEW COMPARISON:  Chest CT dated 02/16/2015 FINDINGS: Chronic prominence of the main pulmonary arteries. Overall heart size and pulmonary vascularity are normal. No infiltrates or effusions. No bone abnormality. IMPRESSION: No acute abnormality. Chronic prominence of the main pulmonary arteries. Electronically Signed   By: Lorriane Shire M.D.   On: 09/13/2016 09:44    Assessment/Plan Active Problems:   Cellulitis - Place on Rocephin - suspect secondary to patient scratching  Exposure to scabies - will place on permethrine cream  Hypotension - most likely due to poor oral intake.  - administer fluid bolus   DVT prophylaxis: Heparin Code Status: Full Family Communication:  Disposition Plan: med surg Consults called: None Admission status: observation   Velvet Bathe MD [facility] Pager [phone]  If 7PM-7AM, please contact night-coverage [email or web address] Password [email or web address]  09/13/2016, 10:59 AM

## 2016-09-13 NOTE — ED Notes (Signed)
Lactic Acid 2.3. EDP notified.

## 2016-09-13 NOTE — ED Notes (Signed)
Liter ns bolus started per vo dr [name].

## 2016-09-13 NOTE — ED Triage Notes (Signed)
Painful rash all over for months.  Worse since beginning of December. Pt c/o sob times 4 days.

## 2016-09-13 NOTE — ED Notes (Signed)
Dr [name] in to see pt. Advised of possible bed bugs

## 2016-09-13 NOTE — ED Provider Notes (Signed)
[facility] DEPT Provider Note   CSN: [id number] Arrival date & time: 09/13/16  [id number]    By signing my name below, I, Meghan Simmons, attest that this documentation has been prepared under the direction and in the presence of Meghan Manifold, MD. Electronically Signed: Macon Simmons, ED Scribe. 09/13/16. 8:42 AM.  History   Chief Complaint Chief Complaint  Patient presents with  . Rash   The history is provided by the patient. No language interpreter was used.   HPI Comments: Meghan Simmons is a 50 y.o. female who presents to the Emergency Department complaining of moderate, constant, rash onset last month. She states her rash began on her hands and has since spread to her back, lower extremities and her bottom. Pt reports associated itching, pain and swelling to her upper extremities which radiates to her back. Per pt, she notes some of the areas feel warm to the touch. Pt notes her pain is exacerbated when walking. She states feeling close to passing out due to her pain. Pt notes being seen in the ED on 12/22 for similar symptoms. She states being prescribed medicated creams and was unable to fill them due to high pricing. She reports associated SOB. She denies any developing lesions on her face or mouth. She notes using OTC creams and medication with minimal relief. Pt states she has been gaining weight. She notes no significant changes over the past year. She denies fever.   Past Medical History:  Diagnosis Date  . Heart murmur   . Hypertension     Patient Active Problem List   Diagnosis Date Noted  . ASD (atrial septal defect) 07/25/2014  . Hypertensive urgency 07/24/2014  . Facial droop 07/24/2014  . Tobacco use disorder 07/24/2014    Past Surgical History:  Procedure Laterality Date  . CHOLECYSTECTOMY    . TEE WITHOUT CARDIOVERSION N/A 11/27/2014   Procedure: TRANSESOPHAGEAL ECHOCARDIOGRAM (TEE);  Surgeon: [name], MD;  Location: [facility] ENDOSCOPY;  Service:  Cardiovascular;  Laterality: N/A;    OB History    Gravida Para Term Preterm AB Living   1 1 1     1    SAB TAB Ectopic Multiple Live Births                   Home Medications    Prior to Admission medications   Medication Sig Start Date End Date Taking? Authorizing Provider  aspirin 325 MG tablet Take 325 mg by mouth daily as needed for moderate pain.    Historical Provider, MD  betamethasone dipropionate (DIPROLENE) 0.05 % ointment Apply topically 2 (two) times daily. 09/08/16   Charlesetta Shanks, MD  Calcitriol 3 MCG/GM cream Apply 1 application topically at bedtime. 09/08/16   Charlesetta Shanks, MD  lisinopril (PRINIVIL,ZESTRIL) 20 MG tablet Take 1 tablet (20 mg total) by mouth daily. 07/25/14   Kathie Dike, MD    Family History History reviewed. No pertinent family history.  Social History Social History  Substance Use Topics  . Smoking status: Current Every Day Smoker    Packs/day: 1.00    Types: Cigarettes  . Smokeless tobacco: Never Used  . Alcohol use 0.0 oz/week     Comment: once a week     Allergies   Erythromycin   Review of Systems Review of Systems  Constitutional: Negative for fever.  Respiratory: Positive for shortness of breath.   Musculoskeletal: Positive for joint swelling.  Skin: Positive for rash.  Neurological: Negative for syncope.  All other systems  reviewed and are negative.    Physical Exam Updated Vital Signs BP (!) 90/54   Pulse (!) 53   Temp (P) 97.6 F (36.4 C) (Temporal)   Resp (P) 22   Ht 5\' 4"  (1.626 m)   Wt 200 lb (90.7 kg)   SpO2 92%   BMI 34.33 kg/m   Physical Exam  Constitutional: She appears well-developed and well-nourished. No distress.  HENT:  Head: Normocephalic and atraumatic.  Mouth/Throat: Oropharynx is clear and moist. No oropharyngeal exudate.  Eyes: Conjunctivae and EOM are normal. Pupils are equal, round, and reactive to light. Right eye exhibits no discharge. Left eye exhibits no discharge. No scleral  icterus.  Neck: Normal range of motion. Neck supple. No JVD present. No thyromegaly present.  Cardiovascular: Normal rate, regular rhythm, normal heart sounds and intact distal pulses.  Exam reveals no gallop and no friction rub.   No murmur heard. Pulmonary/Chest: Effort normal and breath sounds normal. No respiratory distress. She has no wheezes. She has no rales.  Abdominal: Soft. Bowel sounds are normal. She exhibits no distension and no mass. There is no tenderness.  Musculoskeletal: Normal range of motion. She exhibits no edema or tenderness.  Lymphadenopathy:    She has no cervical adenopathy.  Neurological: She is alert. Coordination normal.  Skin: Skin is warm and dry. Rash noted. There is erythema.  Diffused erythematous plaques essentially over entire body. Sparing neck, face and scalp. Multiple areas of scaling. Confluent erythema, increased warmth and tenderness of bilateral back and thighs. Concerning of cellulitis.   Psychiatric: She has a normal mood and affect. Her behavior is normal.  Nursing note and vitals reviewed.    ED Treatments / Results   DIAGNOSTIC STUDIES: Oxygen Saturation is 92% on RA, low by my interpretation.    COORDINATION OF CARE: 8:29 AM Discussed treatment plan with pt at bedside which includes pain medication and antibiotics and pt agreed to plan.   Labs (all labs ordered are listed, but only abnormal results are displayed) Labs Reviewed  LACTIC ACID, PLASMA - Abnormal; Notable for the following:       Result Value   Lactic Acid, Venous 2.3 (*)    All other components within normal limits  CBC WITH DIFFERENTIAL/PLATELET - Abnormal; Notable for the following:    WBC 18.7 (*)    Platelets 424 (*)    Neutro Abs 16.7 (*)    All other components within normal limits  BASIC METABOLIC PANEL - Abnormal; Notable for the following:    Sodium 132 (*)    Chloride 96 (*)    Glucose, Bld 109 (*)    Calcium 8.8 (*)    All other components within  normal limits  CULTURE, BLOOD (ROUTINE X 2)  CULTURE, BLOOD (ROUTINE X 2)  LACTIC ACID, PLASMA    EKG  EKG Interpretation None       Radiology No results found.  Procedures Procedures (including critical care time)  Medications Ordered in ED Medications  sodium chloride 0.9 % bolus 1,000 mL (not administered)  cefTRIAXone (ROCEPHIN) 2 g in dextrose 5 % 50 mL IVPB (not administered)  diphenhydrAMINE (BENADRYL) injection 12.5 mg (not administered)  fentaNYL (SUBLIMAZE) injection 50 mcg (not administered)     Initial Impression / Assessment and Plan / ED Course  I have reviewed the triage vital signs and the nursing notes.  Pertinent labs & imaging results that were available during my care of the patient were reviewed by me and considered in my  medical decision making (see chart for details).  Clinical Course     50yF with generalized rash. Innumerable scaly plaques sparing head/neck. May be psoriasis.  She has confluent areas of erythema, increased warmth and tenderness to b/l thighs and trunk which I'm concerned for superimposed cellulitis. Afebrile but has a leukocytosis of 18.7. BP soft. IVF. Abx. Admit.   Final Clinical Impressions(s) / ED Diagnoses   Final diagnoses:  Cellulitis, unspecified cellulitis site    New Prescriptions New Prescriptions   No medications on file    I personally preformed the services scribed in my presence. The recorded information has been reviewed is accurate. Meghan Manifold, MD.     Meghan Manifold, MD 09/15/16 [phone]

## 2016-09-14 LAB — BASIC METABOLIC PANEL
ANION GAP: 6 (ref 5–15)
BUN: 16 mg/dL (ref 6–20)
CALCIUM: 7.9 mg/dL — AB (ref 8.9–10.3)
CO2: 25 mmol/L (ref 22–32)
Chloride: 102 mmol/L (ref 101–111)
Creatinine, Ser: 0.73 mg/dL (ref 0.44–1.00)
GLUCOSE: 120 mg/dL — AB (ref 65–99)
Potassium: 4.3 mmol/L (ref 3.5–5.1)
SODIUM: 133 mmol/L — AB (ref 135–145)

## 2016-09-14 LAB — CBC
HCT: 34.7 % — ABNORMAL LOW (ref 36.0–46.0)
HEMOGLOBIN: 11.1 g/dL — AB (ref 12.0–15.0)
MCH: 29.2 pg (ref 26.0–34.0)
MCHC: 32 g/dL (ref 30.0–36.0)
MCV: 91.3 fL (ref 78.0–100.0)
Platelets: 370 10*3/uL (ref 150–400)
RBC: 3.8 MIL/uL — ABNORMAL LOW (ref 3.87–5.11)
RDW: 12.6 % (ref 11.5–15.5)
WBC: 14.1 10*3/uL — AB (ref 4.0–10.5)

## 2016-09-14 LAB — LACTIC ACID, PLASMA: LACTIC ACID, VENOUS: 1.8 mmol/L (ref 0.5–1.9)

## 2016-09-14 MED ORDER — HYDROXYZINE HCL 25 MG PO TABS: 25.0000 mg | ORAL_TABLET | Freq: Three times a day (TID) | ORAL | 0 refills | Status: DC | PRN

## 2016-09-14 MED ORDER — CEFDINIR 300 MG PO CAPS: 300.0000 mg | ORAL_CAPSULE | Freq: Two times a day (BID) | ORAL | 0 refills | Status: DC

## 2016-09-14 MED ORDER — PERMETHRIN 5 % EX CREA: 1.0000 "application " | TOPICAL_CREAM | Freq: Once | CUTANEOUS | 0 refills | Status: AC

## 2016-09-14 NOTE — Discharge Summary (Signed)
Physician Discharge Summary  Meghan Simmons [id number] DOB: 01/13/1966 DOA: 09/13/2016  PCP: Meghan Ganja, NP  Admit date: 09/13/2016 Discharge date: 09/14/2016  Time spent: > 35 minutes  Recommendations for Outpatient Follow-up:  1. Monitor WBC levels.   Discharge Diagnoses:  Active Problems:   Tobacco use disorder   Cellulitis   Scabies exposure   Discharge Condition: stable  Diet recommendation: Regular diet  Filed Weights   09/13/16 0737 09/13/16 1100  Weight: 90.7 kg (200 lb) 91.8 kg (202 lb 4.8 oz)    History of present illness:  50 y.o. female with medical history significant of Psoriasis and recent exposure to scabies (untreated). Presented to the hospital complaining of  Worsening rash since November with itching  Hospital Course:  Rash/cellulitis - multifactorial, suspect combination of psoriasis, cellulitis from scratching, and possibly scabies. - Pt improved with permethrin cream. Will prescribe on d/c - atarax for itching - omnicef x 6 more days on d/c  Hypotension - resolved - home ACE inhibitor on d/c  Lactic acidosis  - resolved  Procedures:  None  Consultations:  None  Discharge Exam: Vitals:   09/14/16 0634 09/14/16 0834  BP: (!) 93/57 (!) 101/58  Pulse: 80   Resp:    Temp: 97.1 F (36.2 C)     General: Pt in nad, alert and awake Cardiovascular: rrr, no rubs Respiratory: no increased wob, no wheezes  Discharge Instructions   Discharge Instructions    Call Simmons for:  extreme fatigue    Complete by:  As directed    Call Simmons for:  temperature >100.4    Complete by:  As directed    Diet - low sodium heart healthy    Complete by:  As directed    Discharge instructions    Complete by:  As directed    Please be sure to follow up with your primary care physician in 1-2 weeks or sooner should any new concerns arise. Also ensure you disinfect your home appropriately of "bed bugs."   Increase activity slowly    Complete by:  As  directed      Current Discharge Medication List    START taking these medications   Details  cefdinir (OMNICEF) 300 MG capsule Take 1 capsule (300 mg total) by mouth 2 (two) times daily. Qty: 12 capsule, Refills: 0    hydrOXYzine (ATARAX/VISTARIL) 25 MG tablet Take 1 tablet (25 mg total) by mouth 3 (three) times daily as needed for itching. Qty: 30 tablet, Refills: 0    permethrin (ELIMITE) 5 % cream Apply 1 application topically once. Qty: 60 g, Refills: 0      CONTINUE these medications which have NOT CHANGED   Details  citalopram (CELEXA) 20 MG tablet Take 10 mg by mouth daily.    aspirin 325 MG tablet Take 325 mg by mouth daily as needed for moderate pain.    betamethasone dipropionate (DIPROLENE) 0.05 % ointment Apply topically 2 (two) times daily. Qty: 50 g, Refills: 1    Calcitriol 3 MCG/GM cream Apply 1 application topically at bedtime. Qty: 100 g, Refills: 1      STOP taking these medications     fluconazole (DIFLUCAN) 150 MG tablet      lisinopril (PRINIVIL,ZESTRIL) 20 MG tablet        Allergies  Allergen Reactions  . Erythromycin Nausea And Vomiting and Rash      The results of significant diagnostics from this hospitalization (including imaging, microbiology, ancillary and laboratory) are listed below  for reference.    Significant Diagnostic Studies: Dg Chest Port 1 View  Result Date: 09/13/2016 CLINICAL DATA:  Dyspnea. EXAM: PORTABLE CHEST 1 VIEW COMPARISON:  Chest CT dated 02/16/2015 FINDINGS: Chronic prominence of the main pulmonary arteries. Overall heart size and pulmonary vascularity are normal. No infiltrates or effusions. No bone abnormality. IMPRESSION: No acute abnormality. Chronic prominence of the main pulmonary arteries. Electronically Signed   By: Meghan Shire M.D.   On: 09/13/2016 09:44    Microbiology: Recent Results (from the past 240 hour(s))  Blood culture (routine x 2)     Status: None (Preliminary result)   Collection Time:  09/13/16  8:58 AM  Result Value Ref Range Status   Specimen Description BLOOD RIGHT ANTECUBITAL  Final   Special Requests BOTTLES DRAWN AEROBIC AND ANAEROBIC 6CCEACH  Final   Culture NO GROWTH <12 HOURS  Final   Report Status PENDING  Incomplete  Blood culture (routine x 2)     Status: None (Preliminary result)   Collection Time: 09/13/16  9:09 AM  Result Value Ref Range Status   Specimen Description BLOOD LEFT ANTECUBITAL  Final   Special Requests BOTTLES DRAWN AEROBIC AND ANAEROBIC 10 CC EACH  Final   Culture NO GROWTH <12 HOURS  Final   Report Status PENDING  Incomplete     Labs: Basic Metabolic Panel:  Recent Labs Lab 09/13/16 0858 09/13/16 1135 09/14/16 0503  NA 132* 132* 133*  K 4.1 4.2 4.3  CL 96* 98* 102  CO2 23 24 25   GLUCOSE 109* 103* 120*  BUN 20 18 16   CREATININE 0.85 0.79 0.73  CALCIUM 8.8* 8.5* 7.9*   Liver Function Tests: No results for input(s): AST, ALT, ALKPHOS, BILITOT, PROT, ALBUMIN in the last 168 hours. No results for input(s): LIPASE, AMYLASE in the last 168 hours. No results for input(s): AMMONIA in the last 168 hours. CBC:  Recent Labs Lab 09/13/16 0858 09/14/16 0503  WBC 18.7* 14.1*  NEUTROABS 16.7*  --   HGB 13.6 11.1*  HCT 40.7 34.7*  MCV 90.8 91.3  PLT 424* 370   Cardiac Enzymes: No results for input(s): CKTOTAL, CKMB, CKMBINDEX, TROPONINI in the last 168 hours. BNP: BNP (last 3 results) No results for input(s): BNP in the last 8760 hours.  ProBNP (last 3 results) No results for input(s): PROBNP in the last 8760 hours.  CBG: No results for input(s): GLUCAP in the last 168 hours.   Signed:  Velvet Bathe Simmons.  [facility] 09/14/2016, 1:05 PM

## 2016-09-14 NOTE — Care Management Note (Signed)
Case Management Note  Patient Details  Name: Meghan Simmons MRN: [id number] Date of Birth: 1966-05-25    Expected Discharge Date:  09/15/16               Expected Discharge Plan:  Home/Self Care  In-House Referral:  NA  Discharge planning Services  CM Consult, [place] Program  Post Acute Care Choice:    Choice offered to:     DME Arranged:    DME Agency:     HH Arranged:    HH Agency:     Status of Service:  Completed, signed off  If discussed at [employer] of Stay Meetings, dates discussed:    Additional Comments: Patient discharging home today. Patient needs assistance affording medications. [place] program voucher given with instructions. No other CM needs.  Khloi Rawl, Chauncey Reading, RN 09/14/2016, 3:03 PM

## 2016-09-17 ENCOUNTER — Inpatient Hospital Stay (HOSPITAL_COMMUNITY)
Admission: EM | Admit: 2016-09-17 | Discharge: 2016-09-26 | DRG: 872 | Disposition: A | Discharge status: Home / Self Care | Payer: Self-pay | Attending: Internal Medicine | Admitting: Internal Medicine

## 2016-09-17 ENCOUNTER — Encounter (HOSPITAL_COMMUNITY): Payer: Self-pay | Admitting: *Deleted

## 2016-09-17 DIAGNOSIS — Z841 Family history of disorders of kidney and ureter: Secondary | ICD-10-CM

## 2016-09-17 DIAGNOSIS — F329 Major depressive disorder, single episode, unspecified: Secondary | ICD-10-CM | POA: Diagnosis present

## 2016-09-17 DIAGNOSIS — Z7982 Long term (current) use of aspirin: Secondary | ICD-10-CM

## 2016-09-17 DIAGNOSIS — E46 Unspecified protein-calorie malnutrition: Secondary | ICD-10-CM

## 2016-09-17 DIAGNOSIS — L440 Pityriasis rubra pilaris: Secondary | ICD-10-CM | POA: Diagnosis present

## 2016-09-17 DIAGNOSIS — C9100 Acute lymphoblastic leukemia not having achieved remission: Secondary | ICD-10-CM | POA: Diagnosis present

## 2016-09-17 DIAGNOSIS — E860 Dehydration: Secondary | ICD-10-CM | POA: Diagnosis present

## 2016-09-17 DIAGNOSIS — I959 Hypotension, unspecified: Secondary | ICD-10-CM | POA: Diagnosis present

## 2016-09-17 DIAGNOSIS — Z8673 Personal history of transient ischemic attack (TIA), and cerebral infarction without residual deficits: Secondary | ICD-10-CM

## 2016-09-17 DIAGNOSIS — B9562 Methicillin resistant Staphylococcus aureus infection as the cause of diseases classified elsewhere: Secondary | ICD-10-CM | POA: Diagnosis present

## 2016-09-17 DIAGNOSIS — F17200 Nicotine dependence, unspecified, uncomplicated: Secondary | ICD-10-CM | POA: Diagnosis present

## 2016-09-17 DIAGNOSIS — R21 Rash and other nonspecific skin eruption: Secondary | ICD-10-CM | POA: Diagnosis present

## 2016-09-17 DIAGNOSIS — Z881 Allergy status to other antibiotic agents status: Secondary | ICD-10-CM

## 2016-09-17 DIAGNOSIS — D649 Anemia, unspecified: Secondary | ICD-10-CM | POA: Diagnosis present

## 2016-09-17 DIAGNOSIS — I10 Essential (primary) hypertension: Secondary | ICD-10-CM | POA: Diagnosis present

## 2016-09-17 DIAGNOSIS — E43 Unspecified severe protein-calorie malnutrition: Secondary | ICD-10-CM

## 2016-09-17 DIAGNOSIS — Z79899 Other long term (current) drug therapy: Secondary | ICD-10-CM

## 2016-09-17 DIAGNOSIS — A419 Sepsis, unspecified organism: Principal | ICD-10-CM | POA: Diagnosis present

## 2016-09-17 HISTORY — DX: Cerebral infarction, unspecified: I63.9

## 2016-09-17 LAB — COMPREHENSIVE METABOLIC PANEL
ALK PHOS: 68 U/L (ref 38–126)
ALT: 13 U/L — AB (ref 14–54)
AST: 23 U/L (ref 15–41)
Albumin: 2.3 g/dL — ABNORMAL LOW (ref 3.5–5.0)
Anion gap: 8 (ref 5–15)
BILIRUBIN TOTAL: 0.5 mg/dL (ref 0.3–1.2)
BUN: 9 mg/dL (ref 6–20)
CALCIUM: 7.8 mg/dL — AB (ref 8.9–10.3)
CO2: 24 mmol/L (ref 22–32)
CREATININE: 0.84 mg/dL (ref 0.44–1.00)
Chloride: 100 mmol/L — ABNORMAL LOW (ref 101–111)
GFR calc Af Amer: >60 mL/min (ref >60)
Glucose, Bld: 123 mg/dL — ABNORMAL HIGH (ref 65–99)
POTASSIUM: 3.5 mmol/L (ref 3.5–5.1)
Sodium: 132 mmol/L — ABNORMAL LOW (ref 135–145)
TOTAL PROTEIN: 5.7 g/dL — AB (ref 6.5–8.1)

## 2016-09-17 LAB — CBC WITH DIFFERENTIAL/PLATELET
BASOS ABS: 0 10*3/uL (ref 0.0–0.1)
BASOS PCT: 0 %
EOS ABS: 0.2 10*3/uL (ref 0.0–0.7)
EOS PCT: 1 %
HCT: 37.5 % (ref 36.0–46.0)
Hemoglobin: 12.4 g/dL (ref 12.0–15.0)
Lymphocytes Relative: 11 %
Lymphs Abs: 2.2 10*3/uL (ref 0.7–4.0)
MCH: 30 pg (ref 26.0–34.0)
MCHC: 33.1 g/dL (ref 30.0–36.0)
MCV: 90.8 fL (ref 78.0–100.0)
MONO ABS: 0.8 10*3/uL (ref 0.1–1.0)
Monocytes Relative: 4 %
Neutro Abs: 16.2 10*3/uL — ABNORMAL HIGH (ref 1.7–7.7)
Neutrophils Relative %: 84 %
PLATELETS: 432 10*3/uL — AB (ref 150–400)
RBC: 4.13 MIL/uL (ref 3.87–5.11)
RDW: 13 % (ref 11.5–15.5)
WBC: 19.4 10*3/uL — AB (ref 4.0–10.5)

## 2016-09-17 LAB — RAPID URINE DRUG SCREEN, HOSP PERFORMED
Amphetamines: NOT DETECTED
BARBITURATES: NOT DETECTED
BENZODIAZEPINES: NOT DETECTED
Cocaine: NOT DETECTED
Opiates: NOT DETECTED
Tetrahydrocannabinol: NOT DETECTED

## 2016-09-17 LAB — I-STAT CG4 LACTIC ACID, ED: LACTIC ACID, VENOUS: 2.04 mmol/L — AB (ref 0.5–1.9)

## 2016-09-17 MED ORDER — METHYLPREDNISOLONE SODIUM SUCC 125 MG IJ SOLR
125.0000 mg | Freq: Once | INTRAMUSCULAR | Status: AC
  Administered 2016-09-17: 125 mg via INTRAVENOUS
  Filled 2016-09-17: qty 2

## 2016-09-17 MED ORDER — BACITRACIN ZINC 500 UNIT/GM EX OINT
TOPICAL_OINTMENT | Freq: Two times a day (BID) | CUTANEOUS | Status: DC
  Administered 2016-09-18: 01:00:00 via TOPICAL
  Administered 2016-09-18 – 2016-09-20 (×5): 1 via TOPICAL
  Administered 2016-09-20: 22:00:00 via TOPICAL
  Administered 2016-09-21 – 2016-09-26 (×11): 1 via TOPICAL
  Filled 2016-09-17 (×17): qty 0.9

## 2016-09-17 MED ORDER — LIDOCAINE-EPINEPHRINE (PF) 1 %-1:200000 IJ SOLN
INTRAMUSCULAR | Status: AC
  Administered 2016-09-18: 01:00:00
  Filled 2016-09-17: qty 30

## 2016-09-17 MED ORDER — SODIUM CHLORIDE 0.9 % IV BOLUS (SEPSIS)
3000.0000 mL | Freq: Once | INTRAVENOUS | Status: AC
Start: 2016-09-17 — End: 2016-09-18
  Administered 2016-09-17: 1000 mL via INTRAVENOUS

## 2016-09-17 NOTE — ED Provider Notes (Addendum)
[facility] DEPT Provider Note   CSN: [id number] Arrival date & time: 09/17/16  1710     History   Chief Complaint Chief Complaint  Patient presents with  . Rash    HPI Meghan Simmons is a 50 y.o. female.  Patient with presumed history of psoriasis but patient states she did not have any such history prior to November. Patient with admission on December 27 discharge December 28 for similar presentation is now. Patient states that the skin rash is much worse in the skin peeling is definitely worse. Denies any open wounds. During her last admission they felt it was perhaps a combination of psoriasis dermatitis and excessive scratching. Patient denies any fevers. Does states she has swelling to her arms.      Past Medical History:  Diagnosis Date  . Heart murmur   . Hypertension     Patient Active Problem List   Diagnosis Date Noted  . Cellulitis 09/13/2016  . Scabies exposure 09/13/2016  . ASD (atrial septal defect) 07/25/2014  . Hypertensive urgency 07/24/2014  . Facial droop 07/24/2014  . Tobacco use disorder 07/24/2014    Past Surgical History:  Procedure Laterality Date  . CHOLECYSTECTOMY    . TEE WITHOUT CARDIOVERSION N/A 11/27/2014   Procedure: TRANSESOPHAGEAL ECHOCARDIOGRAM (TEE);  Surgeon: [name], MD;  Location: [facility] ENDOSCOPY;  Service: Cardiovascular;  Laterality: N/A;    OB History    Gravida Para Term Preterm AB Living   1 1 1     1    SAB TAB Ectopic Multiple Live Births                   Home Medications    Prior to Admission medications   Medication Sig Start Date End Date Taking? Authorizing Provider  aspirin 325 MG tablet Take 325 mg by mouth daily as needed for moderate pain.   Yes Historical Provider, MD  betamethasone dipropionate (DIPROLENE) 0.05 % cream Apply 0.05 % topically 2 (two) times daily.   Yes Historical Provider, MD  cefdinir (OMNICEF) 300 MG capsule Take 1 capsule (300 mg total) by mouth 2 (two) times daily. 09/14/16  09/20/16 Yes Velvet Bathe, MD  citalopram (CELEXA) 20 MG tablet Take 10 mg by mouth daily.   Yes Historical Provider, MD  hydrOXYzine (ATARAX/VISTARIL) 25 MG tablet Take 1 tablet (25 mg total) by mouth 3 (three) times daily as needed for itching. 09/14/16  Yes Velvet Bathe, MD  lisinopril (PRINIVIL,ZESTRIL) 20 MG tablet Take 20 mg by mouth daily.   Yes Historical Provider, MD    Family History History reviewed. No pertinent family history.  Social History Social History  Substance Use Topics  . Smoking status: Current Every Day Smoker    Packs/day: 1.00    Types: Cigarettes  . Smokeless tobacco: Never Used  . Alcohol use 0.0 oz/week     Comment: once a week     Allergies   Erythromycin   Review of Systems Review of Systems  Constitutional: Negative for fever.  HENT: Positive for tinnitus. Negative for congestion.   Eyes: Negative for redness.  Respiratory: Negative for shortness of breath.   Cardiovascular: Negative for chest pain.  Gastrointestinal: Negative for abdominal pain.  Genitourinary: Negative for dysuria.  Musculoskeletal: Negative for back pain.  Skin: Positive for rash.  Neurological: Negative for headaches.  Hematological: Does not bruise/bleed easily.  Psychiatric/Behavioral: Negative for confusion.     Physical Exam Updated Vital Signs BP 115/70 (BP Location: Right Arm)  Pulse (!) 52   Temp 97.8 F (36.6 C) (Oral)   Resp 21   Ht 5\' 5"  (1.651 m)   Wt 91.6 kg   SpO2 100%   BMI 33.61 kg/m   Physical Exam  Constitutional: She is oriented to person, place, and time. She appears well-developed and well-nourished. No distress.  HENT:  Head: Normocephalic and atraumatic.  Mouth/Throat: Oropharynx is clear and moist.  Eyes: Conjunctivae and EOM are normal. Pupils are equal, round, and reactive to light.  Neck: Normal range of motion. Neck supple.  Cardiovascular: Normal rate, regular rhythm and normal heart sounds.   Pulmonary/Chest: Effort normal  and breath sounds normal. No respiratory distress.  Abdominal: Soft. Bowel sounds are normal. There is no tenderness.  Musculoskeletal: She exhibits edema.  Neurological: She is alert and oriented to person, place, and time.  Skin: Rash noted. There is erythema.  No skin sloughing. Skin peeling no blisters marked erythema marked edema to extremities. Erythema to abdomen chest legs arms neck and back. No vesicles no hives.  Nursing note and vitals reviewed.    ED Treatments / Results  Labs (all labs ordered are listed, but only abnormal results are displayed) Labs Reviewed  COMPREHENSIVE METABOLIC PANEL - Abnormal; Notable for the following:       Result Value   Sodium 132 (*)    Chloride 100 (*)    Glucose, Bld 123 (*)    Calcium 7.8 (*)    Total Protein 5.7 (*)    Albumin 2.3 (*)    ALT 13 (*)    All other components within normal limits  CBC WITH DIFFERENTIAL/PLATELET - Abnormal; Notable for the following:    WBC 19.4 (*)    Platelets 432 (*)    Neutro Abs 16.2 (*)    All other components within normal limits    EKG  EKG Interpretation None       Radiology No results found.  Procedures Procedures (including critical care time)  Medications Ordered in ED Medications - No data to display   Initial Impression / Assessment and Plan / ED Course  I have reviewed the triage vital signs and the nursing notes.  Pertinent labs & imaging results that were available during my care of the patient were reviewed by me and considered in my medical decision making (see chart for details).  Clinical Course    The patient admitted and discharge December 27 28th for rash presumed to be part psoriasis part may be scabies. Treated with permethrin. Patient was showing signs of improvement in the hospital. Patient is now back with extensive rash. May very well be a psoriasis type rash does not appear to be consistent with scabies. No evidence of any bedbugs. Feel patient's require  readmission for this extensive rash. This was an less overlooked did not see where she was treated with steroids. We'll discuss with hospitalist for admission.  After discussion with the hospitalist. Neoma Laming try to arrange a dermatology consult with Dr. Allyn Kenner. Patient may have early sepsis. Will be treated as such. Lactic acid elevated once again.  Patient had blood cultures done his leukocytosis) rate up some. Blood pressures above 90 no hypotension but is in the lower 90s.  Not clear-cut sepsis. But will probably be treated as such by the hospitalist.   Final Clinical Impressions(s) / ED Diagnoses   Final diagnoses:  Rash    New Prescriptions New Prescriptions   No medications on file     [facility],  MD 09/17/16 2001    Fredia Sorrow, MD 09/17/16 2109  Procedure:  Skin biopsy done at the request of the hospitalist. She had been in contact with dermatology and they recommended a full thickness skin biopsy. This was done on the patient's left area cleaned with Betadine anesthetized with 1% lidocaine with epi 2 mL injected. 5 mm biopsy through the dermis taken. Sent to the lab for pathology. Single stitch of 3-0 Prolene used to close the wound. Patient tolerated procedure well.    Fredia Sorrow, MD 09/17/16 2351

## 2016-09-17 NOTE — ED Triage Notes (Signed)
Pt brought in by [facility] for c/o rash all over body since November; pt states she was discharged and was told she has bedbugs

## 2016-09-17 NOTE — ED Notes (Signed)
Hospitalist at bedside 

## 2016-09-17 NOTE — ED Notes (Signed)
Pt states that she doesn't want any of information to be given out or if any calls to tell she is here.  Registration notified

## 2016-09-17 NOTE — H&P (Signed)
History and Physical    Meghan Simmons [id number] DOB: [birth year] DOA: 09/17/2016  PCP: [facility] Department Consultants: None Patient coming from: home - lives with uncle; NOK: mother, Harrington Challenger  Chief Complaint: rash  HPI: Meghan Simmons is a 50 y.o. female with medical history significant of HTN and depression with recent treatment for persistent and worsening rash.  Patient started with a rash in November.  [facility]  Started as small sores on her arms, not scaly.  Looked like bumps/bite marks.  Health Department thought it was bed bugs.  The bed bugs were treated and disappeared but have since returned to the house where she lives.  Meanwhile, the rash continued unabated.  She went back to the HD and was treated for scabies.  Rash keeps getting worse, is now confluent all over her body.  They encouraged her to go to [facility], where she was diagnosed with psoriasis and was discharged.  Waited a week and returned to [facility] and again told it might be bed bugs.  Rash is confluent and diffuse, her face is the only place on her entire body that does not have generalized rash.  Possible subjective fever - chills/sweats.  No weight changes other than mild weifght gain over the holidays.  Mild SOB since 12/20, intermittent.  Notices SOB at rest,l primarily later in the day.  No cough.  Occasional nausea but no emesis.  Occasionally dizzy.  No urinary symptoms other than "it;s kinda real thick".   Does have some prickly tingling all over from the rash.  Initial ER visit was 12/22.  She was treated for generalized psoriasis and yeast dermatitis and given Betamethasone topical, calcitriol cream, and Diflucan 150 mg PO x 2.    [facility]-28 and diagnosed with psoriasis and scabies.  She was treated with permethrin cream and Omnicef.   ED Course: Per Dr. Rogene [place]:  The patient admitted and discharge December 27 28th for rash  presumed to be part psoriasis part may be scabies. Treated with permethrin. Patient was showing signs of improvement in the hospital. Patient is now back with extensive rash. May very well be a psoriasis type rash does not appear to be consistent with scabies. No evidence of any bedbugs. Feel patient's require readmission for this extensive rash. This was an less overlooked did not see where she was treated with steroids. We'll discuss with hospitalist for admission.  After discussion with the hospitalist. Neoma Laming try to arrange a dermatology consult with Dr. Allyn Kenner. Patient may have early sepsis. Will be treated as such. Lactic acid elevated once again.  Patient had blood cultures done his leukocytosis) rate up some. Blood pressures above 90 no hypotension but is in the lower 90s.  Not clear-cut sepsis. But will probably be treated as such by the hospitalist.   Review of Systems: As per HPI; otherwise 10 point review of systems reviewed and negative.   Ambulatory Status:  Ambulates without assistance  Past Medical History:  Diagnosis Date  . CVA (cerebral vascular accident) ([place]) 2015  . Heart murmur   . Hypertension     Past Surgical History:  Procedure Laterality Date  . CHOLECYSTECTOMY    . TEE WITHOUT CARDIOVERSION N/A 11/27/2014   Procedure: TRANSESOPHAGEAL ECHOCARDIOGRAM (TEE);  Surgeon: [name], MD;  Location: [facility] ENDOSCOPY;  Service: Cardiovascular;  Laterality: N/A;    Social History   Social History  . Marital status: Legally  Separated    Spouse name: N/A  . Number of children: N/A  . Years of education: N/A   Occupational History  . unemployed    Social History Main Topics  . Smoking status: Current Every Day Smoker    Packs/day: 1.00    Types: Cigarettes    Start date: 2008  . Smokeless tobacco: Never Used     Comment: declines patch  . Alcohol use No     Comment: none in 2+ years  . Drug use: No     Comment: acknowledges 1-time crack cocaine use shortly  before the onset of the initial rash  . Sexual activity: No     Comment: no partners in the last year   Other Topics Concern  . Not on file   Social History Narrative  . No narrative on file    Allergies  Allergen Reactions  . Erythromycin Nausea And Vomiting and Rash    Family History  Problem Relation Age of Onset  . Kidney failure Father 66    dies while at dialysis    Prior to Admission medications   Medication Sig Start Date End Date Taking? Authorizing Provider  aspirin 325 MG tablet Take 325 mg by mouth daily as needed for moderate pain.   Yes Historical Provider, MD  betamethasone dipropionate (DIPROLENE) 0.05 % cream Apply 0.05 % topically 2 (two) times daily.   Yes Historical Provider, MD  cefdinir (OMNICEF) 300 MG capsule Take 1 capsule (300 mg total) by mouth 2 (two) times daily. 09/14/16 09/20/16 Yes Velvet Bathe, MD  citalopram (CELEXA) 20 MG tablet Take 10 mg by mouth daily.   Yes Historical Provider, MD  hydrOXYzine (ATARAX/VISTARIL) 25 MG tablet Take 1 tablet (25 mg total) by mouth 3 (three) times daily as needed for itching. 09/14/16  Yes Velvet Bathe, MD  lisinopril (PRINIVIL,ZESTRIL) 20 MG tablet Take 20 mg by mouth daily.   Yes Historical Provider, MD    Physical Exam: Vitals:   09/17/16 1900 09/17/16 1930 09/17/16 2000 09/17/16 2029  BP: 91/77 92/55 (!) 94/54   Pulse:   104   Resp:      Temp:    98 F (36.7 C)  TempSrc:    Oral  SpO2:   96%   Weight:      Height:         General: Appears uncomfortable with a diffuse, erythematous rash.  Poor hygiene. Eyes:  PERRL, EOMI, normal lids, iris ENT:  grossly normal hearing, lips & tongue, mmm.  Poor dentition. Neck:  no LAD, masses or thyromegaly Cardiovascular:  RRR, no m/r/g. No LE edema.  Respiratory:  CTA bilaterally, no w/r/r. Normal respiratory effort. Abdomen:  soft, ntnd, NABS Skin:  Diffuse scaly/excoriating rash that is confluent.  It covers most body surfaces including her face and the  lower part of her face; her entire chest and back; her buttocks completely; her legs.  The rash was present on the palms but spared the soles.  There was a bit of sparing in the groin region.  And there was a separate, intertriginous rash with purulent, foul-smelling drainage underneath the breasts. Musculoskeletal:  grossly normal tone BUE/BLE, good ROM, no bony abnormality Psychiatric:  Blunted affect, speech fluent and appropriate, AOx3 Neurologic:  CN 2-12 grossly intact, moves all extremities in coordinated fashion, sensation intact  Labs on Admission: I have personally reviewed following labs and imaging studies  CBC:  Recent Labs Lab 09/13/16 0858 09/14/16 0503 09/17/16 1815  WBC 18.7*  14.1* 19.4*  NEUTROABS 16.7*  --  16.2*  HGB 13.6 11.1* 12.4  HCT 40.7 34.7* 37.5  MCV 90.8 91.3 90.8  PLT 424* 370 [id number]*   Basic Metabolic Panel:  Recent Labs Lab 09/13/16 0858 09/13/16 1135 09/14/16 0503 09/17/16 1815  NA 132* 132* 133* 132*  K 4.1 4.2 4.3 3.5  CL 96* 98* 102 100*  CO2 23 24 25 24   GLUCOSE 109* 103* 120* 123*  BUN 20 18 16 9   CREATININE 0.85 0.79 0.73 0.84  CALCIUM 8.8* 8.5* 7.9* 7.8*   GFR: Estimated Creatinine Clearance: 91.5 mL/min (by C-G formula based on SCr of 0.84 mg/dL). Liver Function Tests:  Recent Labs Lab 09/17/16 1815  AST 23  ALT 13*  ALKPHOS 68  BILITOT 0.5  PROT 5.7*  ALBUMIN 2.3*   No results for input(s): LIPASE, AMYLASE in the last 168 hours. No results for input(s): AMMONIA in the last 168 hours. Coagulation Profile: No results for input(s): INR, PROTIME in the last 168 hours. Cardiac Enzymes: No results for input(s): CKTOTAL, CKMB, CKMBINDEX, TROPONINI in the last 168 hours. BNP (last 3 results) No results for input(s): PROBNP in the last 8760 hours. HbA1C: No results for input(s): HGBA1C in the last 72 hours. CBG: No results for input(s): GLUCAP in the last 168 hours. Lipid Profile: No results for input(s): CHOL, HDL,  LDLCALC, TRIG, CHOLHDL, LDLDIRECT in the last 72 hours. Thyroid Function Tests: No results for input(s): TSH, T4TOTAL, FREET4, T3FREE, THYROIDAB in the last 72 hours. Anemia Panel: No results for input(s): VITAMINB12, FOLATE, FERRITIN, TIBC, IRON, RETICCTPCT in the last 72 hours. Urine analysis:    Component Value Date/Time   COLORURINE YELLOW 07/24/2014 2155   APPEARANCEUR CLEAR 07/24/2014 2155   LABSPEC 1.010 07/24/2014 2155   PHURINE 6.5 07/24/2014 2155   GLUCOSEU NEGATIVE 07/24/2014 2155   HGBUR NEGATIVE 07/24/2014 2155   BILIRUBINUR NEGATIVE 07/24/2014 2155   KETONESUR NEGATIVE 07/24/2014 2155   PROTEINUR NEGATIVE 07/24/2014 2155   UROBILINOGEN 0.2 07/24/2014 2155   NITRITE NEGATIVE 07/24/2014 2155   LEUKOCYTESUR TRACE (A) 07/24/2014 2155    Creatinine Clearance: Estimated Creatinine Clearance: 91.5 mL/min (by C-G formula based on SCr of 0.84 mg/dL).  Sepsis Labs: @LABRCNTIP (procalcitonin:4,lacticidven:4) ) Recent Results (from the past 240 hour(s))  Blood culture (routine x 2)     Status: None (Preliminary result)   Collection Time: 09/13/16  8:58 AM  Result Value Ref Range Status   Specimen Description BLOOD RIGHT ANTECUBITAL  Final   Special Requests BOTTLES DRAWN AEROBIC AND ANAEROBIC 6CCEACH  Final   Culture NO GROWTH 4 DAYS  Final   Report Status PENDING  Incomplete  Blood culture (routine x 2)     Status: None (Preliminary result)   Collection Time: 09/13/16  9:09 AM  Result Value Ref Range Status   Specimen Description BLOOD LEFT ANTECUBITAL  Final   Special Requests BOTTLES DRAWN AEROBIC AND ANAEROBIC 10 CC EACH  Final   Culture NO GROWTH 4 DAYS  Final   Report Status PENDING  Incomplete  Culture, blood (Routine X 2) w Reflex to ID Panel     Status: None (Preliminary result)   Collection Time: 09/17/16  8:28 PM  Result Value Ref Range Status   Specimen Description BLOOD LEFT ARM  Final   Special Requests BOTTLES DRAWN AEROBIC AND ANAEROBIC [place]  Final    Culture PENDING  Incomplete   Report Status PENDING  Incomplete  Culture, blood (Routine X 2) w Reflex to ID Panel  Status: None (Preliminary result)   Collection Time: 09/17/16  8:49 PM  Result Value Ref Range Status   Specimen Description BLOOD LEFT FOREARM  Final   Special Requests BOTTLES DRAWN AEROBIC AND ANAEROBIC [facility]  Final   Culture PENDING  Incomplete   Report Status PENDING  Incomplete     Radiological Exams on Admission: No results found.  EKG: not done  Assessment/Plan Principal Problem:   Sepsis ([place]) Active Problems:   Tobacco use disorder   Rash   Malnutrition (HCC)   Sepsis -WBC 19.4 -Lactate 2.04 -Elevated WBC count to 19.4, tachycardia x 1, tachypnea to 21 with elevated lactate to 2.04 and borderline hypotension -Sepsis protocol initiated; has not yet received all of IVF bolus -Rash is source - see below -Blood and urine cultures pending -Will admit and continue to monitor -Treat with IV Vancomycin (see below) -Will add HIV and RPR (see below) -Will trend lactate to ensure improvement  Rash After brief curbside consultation with 2 dermatology friends and sending photos approved by the patient beforehand, the most likely differential appears to be: 1- erythrodermic psoriasis - this is a very rare variant of psoriasis that involves more than 75% of the skin involvement and involves sloughing/excoriation.  She has not been taking any medications that would be triggers for this condition.  Will check for HIV/RPR as infectious etiologies also occur.  If this is thought to be the etiology, she will need prompt treatment with cyclosporine or infliximab. 2- CTCL - cutaneous T-cell leukemia.  She does not have any of the systemic symptoms associated with malignancy but this does not preclude cutaneous involvement. 2- PRP - pityriasis rubra pilaris.  This is historically very challenging to treat.  She does not have any apparent mucosal involvement.  Retinoids and  methotrexate are standard first-line agents.  The plan for now will be: -ER doctor to perform a shave biopsy (no punch available) down to the dermis of a trunk lesion to send for pathology analysis -Blood cultures (pending) and evaluation/treatment for sepsis -Bacterial and fungal skin cultures -HIV, RPR testing - immunocompromise can increase the likelihood of any of the aforementioned conditions, as well as disseminated scabies. -Vancomycin to cover staph/strep infections -Solumedrol with a slow steroid taper due to extremely high risk for recurrence -Formal dermatology consult when available  Malnutrition -SW and nutrition consults  Tobacco dependence -Encourage cessation.  This was discussed with the patient and should be reviewed on an ongoing basis.   -Patch declined by patient.  DVT prophylaxis:  Lovenox  Code Status:  Full - confirmed with patient (although she was quite tentative about this and may change her mind) Family Communication: None present  Disposition Plan:  Home once clinically improved Consults called: SW, Nutrition, dermatology when available  Admission status: Admit - It is my clinical opinion that admission to INPATIENT is reasonable and necessary because this patient will require at least 2 midnights in the hospital to treat this condition based on the medical complexity of the problems presented.  Given the aforementioned information, the predictability of an adverse outcome is felt to be significant.    [name] MD [facility]  If 7PM-7AM, please contact night-coverage [email or web address] Password [email or web address]  09/17/2016, 10:17 PM

## 2016-09-18 ENCOUNTER — Encounter (HOSPITAL_COMMUNITY): Payer: Self-pay

## 2016-09-18 DIAGNOSIS — E440 Moderate protein-calorie malnutrition: Secondary | ICD-10-CM

## 2016-09-18 LAB — LACTIC ACID, PLASMA
LACTIC ACID, VENOUS: 2.1 mmol/L — AB (ref 0.5–1.9)
Lactic Acid, Venous: 1.9 mmol/L (ref 0.5–1.9)
Lactic Acid, Venous: 2.2 mmol/L (ref 0.5–1.9)

## 2016-09-18 LAB — BASIC METABOLIC PANEL
ANION GAP: 6 (ref 5–15)
BUN: 11 mg/dL (ref 6–20)
CHLORIDE: 104 mmol/L (ref 101–111)
CO2: 24 mmol/L (ref 22–32)
Calcium: 7.5 mg/dL — ABNORMAL LOW (ref 8.9–10.3)
Creatinine, Ser: 0.66 mg/dL (ref 0.44–1.00)
GFR calc non Af Amer: >60 mL/min (ref >60)
Glucose, Bld: 170 mg/dL — ABNORMAL HIGH (ref 65–99)
POTASSIUM: 4.1 mmol/L (ref 3.5–5.1)
Sodium: 134 mmol/L — ABNORMAL LOW (ref 135–145)

## 2016-09-18 LAB — CBC
HCT: 32.3 % — ABNORMAL LOW (ref 36.0–46.0)
HEMOGLOBIN: 10.9 g/dL — AB (ref 12.0–15.0)
MCH: 30.6 pg (ref 26.0–34.0)
MCHC: 33.7 g/dL (ref 30.0–36.0)
MCV: 90.7 fL (ref 78.0–100.0)
Platelets: 338 10*3/uL (ref 150–400)
RBC: 3.56 MIL/uL — AB (ref 3.87–5.11)
RDW: 12.9 % (ref 11.5–15.5)
WBC: 15.7 10*3/uL — ABNORMAL HIGH (ref 4.0–10.5)

## 2016-09-18 LAB — HIV ANTIBODY (ROUTINE TESTING W REFLEX): HIV Screen 4th Generation wRfx: NONREACTIVE

## 2016-09-18 LAB — APTT: APTT: 38 s — AB (ref 24–36)

## 2016-09-18 LAB — PROCALCITONIN: Procalcitonin: <0.1 ng/mL

## 2016-09-18 LAB — RPR: RPR Ser Ql: NONREACTIVE

## 2016-09-18 LAB — GLUCOSE, CAPILLARY: GLUCOSE-CAPILLARY: 125 mg/dL — AB (ref 65–99)

## 2016-09-18 LAB — PROTIME-INR
INR: 1.13
PROTHROMBIN TIME: 14.6 s (ref 11.4–15.2)

## 2016-09-18 MED ORDER — ONDANSETRON HCL 4 MG PO TABS: 4.0000 mg | ORAL_TABLET | Freq: Four times a day (QID) | ORAL | Status: DC | PRN

## 2016-09-18 MED ORDER — CLOBETASOL PROPIONATE 0.05 % EX OINT
TOPICAL_OINTMENT | Freq: Four times a day (QID) | CUTANEOUS | Status: DC
  Administered 2016-09-18 (×2): 1 via TOPICAL
  Administered 2016-09-18 – 2016-09-22 (×12): via TOPICAL
  Filled 2016-09-18 (×4): qty 15

## 2016-09-18 MED ORDER — NYSTATIN 100000 UNIT/GM EX POWD
Freq: Three times a day (TID) | CUTANEOUS | Status: DC
  Administered 2016-09-18 – 2016-09-26 (×25): via TOPICAL
  Filled 2016-09-18 (×8): qty 15

## 2016-09-18 MED ORDER — ACETAMINOPHEN 325 MG PO TABS
650.0000 mg | ORAL_TABLET | Freq: Four times a day (QID) | ORAL | Status: DC | PRN
  Administered 2016-09-18 – 2016-09-26 (×13): 650 mg via ORAL
  Filled 2016-09-18 (×13): qty 2

## 2016-09-18 MED ORDER — CITALOPRAM HYDROBROMIDE 20 MG PO TABS
10.0000 mg | ORAL_TABLET | Freq: Every day | ORAL | Status: DC
  Administered 2016-09-18 – 2016-09-22 (×5): 10 mg via ORAL
  Administered 2016-09-23: 20 mg via ORAL
  Administered 2016-09-24 – 2016-09-26 (×3): 10 mg via ORAL
  Filled 2016-09-18 (×9): qty 1

## 2016-09-18 MED ORDER — ACETAMINOPHEN 650 MG RE SUPP: 650.0000 mg | Freq: Four times a day (QID) | RECTAL | Status: DC | PRN

## 2016-09-18 MED ORDER — ASPIRIN 325 MG PO TABS: 325.0000 mg | ORAL_TABLET | Freq: Every day | ORAL | Status: DC | PRN

## 2016-09-18 MED ORDER — SODIUM CHLORIDE 0.9 % IV BOLUS (SEPSIS)
1000.0000 mL | Freq: Once | INTRAVENOUS | Status: AC
  Administered 2016-09-18: 1000 mL via INTRAVENOUS

## 2016-09-18 MED ORDER — VANCOMYCIN HCL IN DEXTROSE 1-5 GM/200ML-% IV SOLN
1000.0000 mg | Freq: Once | INTRAVENOUS | Status: AC
  Administered 2016-09-18: 1000 mg via INTRAVENOUS
  Filled 2016-09-18: qty 200

## 2016-09-18 MED ORDER — HYDROXYZINE HCL 25 MG PO TABS
25.0000 mg | ORAL_TABLET | Freq: Three times a day (TID) | ORAL | Status: DC | PRN
  Administered 2016-09-18 – 2016-09-26 (×11): 25 mg via ORAL
  Filled 2016-09-18 (×12): qty 1

## 2016-09-18 MED ORDER — MORPHINE SULFATE (PF) 2 MG/ML IV SOLN
2.0000 mg | INTRAVENOUS | Status: DC | PRN
  Administered 2016-09-19: 2 mg via INTRAVENOUS
  Filled 2016-09-18: qty 1

## 2016-09-18 MED ORDER — ENOXAPARIN SODIUM 40 MG/0.4ML ~~LOC~~ SOLN
40.0000 mg | SUBCUTANEOUS | Status: DC
  Administered 2016-09-18 – 2016-09-26 (×9): 40 mg via SUBCUTANEOUS
  Filled 2016-09-18 (×9): qty 0.4

## 2016-09-18 MED ORDER — METHYLPREDNISOLONE SODIUM SUCC 125 MG IJ SOLR
60.0000 mg | Freq: Two times a day (BID) | INTRAMUSCULAR | Status: DC
  Administered 2016-09-18 – 2016-09-22 (×9): 60 mg via INTRAVENOUS
  Filled 2016-09-18 (×9): qty 2

## 2016-09-18 MED ORDER — ONDANSETRON HCL 4 MG/2ML IJ SOLN: 4.0000 mg | Freq: Four times a day (QID) | INTRAMUSCULAR | Status: DC | PRN

## 2016-09-18 MED ORDER — SODIUM CHLORIDE 0.9 % IV SOLN
INTRAVENOUS | Status: AC
  Administered 2016-09-18 (×2): via INTRAVENOUS

## 2016-09-18 MED ORDER — VANCOMYCIN HCL IN DEXTROSE 1-5 GM/200ML-% IV SOLN
1000.0000 mg | Freq: Two times a day (BID) | INTRAVENOUS | Status: DC
  Administered 2016-09-18 – 2016-09-20 (×6): 1000 mg via INTRAVENOUS
  Filled 2016-09-18 (×7): qty 200

## 2016-09-18 MED ORDER — CLOBETASOL PROPIONATE 0.05 % EX CREA
TOPICAL_CREAM | Freq: Four times a day (QID) | CUTANEOUS | Status: DC
  Filled 2016-09-18: qty 15

## 2016-09-18 NOTE — Progress Notes (Signed)
ANTIBIOTIC CONSULT NOTE  Pharmacy Consult for Vancomycin Indication: Sepsis  Allergies  Allergen Reactions  . Erythromycin Nausea And Vomiting and Rash   Patient Measurements: Height: 5\' 5"  (165.1 cm) Weight: 214 lb 9.6 oz (97.3 kg) IBW/kg (Calculated) : 57  Vital Signs: Temp: 98.6 F (37 C) (01/01 0117) Temp Source: Oral (01/01 0117) BP: 94/50 (01/01 0117) Pulse Rate: 84 (01/01 0117)  Labs:  Recent Labs  09/17/16 1815 09/18/16 0631  WBC 19.4* 15.7*  HGB 12.4 10.9*  PLT 432* 338  CREATININE 0.84 0.66   Estimated Creatinine Clearance: 99.2 mL/min (by C-G formula based on SCr of 0.66 mg/dL).  No results for input(s): VANCOTROUGH, VANCOPEAK, VANCORANDOM, GENTTROUGH, GENTPEAK, GENTRANDOM, TOBRATROUGH, TOBRAPEAK, TOBRARND, AMIKACINPEAK, AMIKACINTROU, AMIKACIN in the last 72 hours.   Microbiology: Recent Results (from the past 720 hour(s))  Blood culture (routine x 2)     Status: None (Preliminary result)   Collection Time: 09/13/16  8:58 AM  Result Value Ref Range Status   Specimen Description BLOOD RIGHT ANTECUBITAL  Final   Special Requests BOTTLES DRAWN AEROBIC AND ANAEROBIC 6CCEACH  Final   Culture NO GROWTH 4 DAYS  Final   Report Status PENDING  Incomplete  Blood culture (routine x 2)     Status: None (Preliminary result)   Collection Time: 09/13/16  9:09 AM  Result Value Ref Range Status   Specimen Description BLOOD LEFT ANTECUBITAL  Final   Special Requests BOTTLES DRAWN AEROBIC AND ANAEROBIC 10 CC EACH  Final   Culture NO GROWTH 4 DAYS  Final   Report Status PENDING  Incomplete  Culture, blood (Routine X 2) w Reflex to ID Panel     Status: None (Preliminary result)   Collection Time: 09/17/16  8:28 PM  Result Value Ref Range Status   Specimen Description BLOOD LEFT ARM  Final   Special Requests BOTTLES DRAWN AEROBIC AND ANAEROBIC [place]  Final   Culture PENDING  Incomplete   Report Status PENDING  Incomplete  Culture, blood (Routine X 2) w Reflex to ID Panel      Status: None (Preliminary result)   Collection Time: 09/17/16  8:49 PM  Result Value Ref Range Status   Specimen Description BLOOD LEFT FOREARM  Final   Special Requests BOTTLES DRAWN AEROBIC AND ANAEROBIC [facility]  Final   Culture PENDING  Incomplete   Report Status PENDING  Incomplete   Medical History: Past Medical History:  Diagnosis Date  . CVA (cerebral vascular accident) ([place]) 2015  . Heart murmur   . Hypertension    Antibiotics Given (last 72 hours)    Date/Time Action Medication Dose Rate   09/18/16 0223 Given   vancomycin (VANCOCIN) IVPB 1000 mg/200 mL premix 1,000 mg 200 mL/hr   09/18/16 0958 Given   vancomycin (VANCOCIN) IVPB 1000 mg/200 mL premix 1,000 mg 200 mL/hr     Assessment: 51 yo female s/p recent hospital discharge 12/28; Pt was diagnosed and treated for psoriasis and scabies; now returns with worsening rash. Pt has elevated WBCs, lactic acid, and subjective fever/chills. Blood, urine cultures pending; skin biopsy pending. Vancomycin for possible early sepsis.  Goal of Therapy:  Vancomycin troughs 15-20 mcg/ml  Plan:  Vancomycin 1000mg  IV q12h Check trough at steady state Monitor labs, renal fxn, progress and c/s [place] ABX when improved / appropriate.    Hart Robinsons A, RPH 09/18/2016,11:19 AM

## 2016-09-18 NOTE — Progress Notes (Signed)
ANTIBIOTIC CONSULT NOTE-Preliminary  Pharmacy Consult for Vancomycin Indication: Sepsis  Allergies  Allergen Reactions  . Erythromycin Nausea And Vomiting and Rash    Patient Measurements: Height: 5\' 5"  (165.1 cm) Weight: 214 lb 9.6 oz (97.3 kg) IBW/kg (Calculated) : 57  Vital Signs: Temp: 98.6 F (37 C) (01/01 0117) Temp Source: Oral (01/01 0117) BP: 94/50 (01/01 0117) Pulse Rate: 84 (01/01 0117)  Labs:  Recent Labs  09/17/16 1815  WBC 19.4*  HGB 12.4  PLT 432*  CREATININE 0.84    Estimated Creatinine Clearance: 94.5 mL/min (by C-G formula based on SCr of 0.84 mg/dL).  No results for input(s): VANCOTROUGH, VANCOPEAK, VANCORANDOM, GENTTROUGH, GENTPEAK, GENTRANDOM, TOBRATROUGH, TOBRAPEAK, TOBRARND, AMIKACINPEAK, AMIKACINTROU, AMIKACIN in the last 72 hours.   Microbiology: Recent Results (from the past 720 hour(s))  Blood culture (routine x 2)     Status: None (Preliminary result)   Collection Time: 09/13/16  8:58 AM  Result Value Ref Range Status   Specimen Description BLOOD RIGHT ANTECUBITAL  Final   Special Requests BOTTLES DRAWN AEROBIC AND ANAEROBIC 6CCEACH  Final   Culture NO GROWTH 4 DAYS  Final   Report Status PENDING  Incomplete  Blood culture (routine x 2)     Status: None (Preliminary result)   Collection Time: 09/13/16  9:09 AM  Result Value Ref Range Status   Specimen Description BLOOD LEFT ANTECUBITAL  Final   Special Requests BOTTLES DRAWN AEROBIC AND ANAEROBIC 10 CC EACH  Final   Culture NO GROWTH 4 DAYS  Final   Report Status PENDING  Incomplete  Culture, blood (Routine X 2) w Reflex to ID Panel     Status: None (Preliminary result)   Collection Time: 09/17/16  8:28 PM  Result Value Ref Range Status   Specimen Description BLOOD LEFT ARM  Final   Special Requests BOTTLES DRAWN AEROBIC AND ANAEROBIC [place]  Final   Culture PENDING  Incomplete   Report Status PENDING  Incomplete  Culture, blood (Routine X 2) w Reflex to ID Panel     Status: None  (Preliminary result)   Collection Time: 09/17/16  8:49 PM  Result Value Ref Range Status   Specimen Description BLOOD LEFT FOREARM  Final   Special Requests BOTTLES DRAWN AEROBIC AND ANAEROBIC [facility]  Final   Culture PENDING  Incomplete   Report Status PENDING  Incomplete    Medical History: Past Medical History:  Diagnosis Date  . CVA (cerebral vascular accident) ([place]) 2015  . Heart murmur   . Hypertension     Medications:   Assessment: 51 yo female s/p recent hospital discharge 12/28; Pt was diagnosed and treated for psoriasis and scabies; now returns with worsening rash. Pt has elevated WBCs, lactic acid, and subjective fever/chills. Blood, urine cultures pending; skin biopsy pending. Vancomycin for possible early sepsis.  Goal of Therapy:  Vancomycin troughs 15-20 mcg/ml  Plan:  Preliminary review of pertinent patient information completed.  Protocol will be initiated with a one-time dose of Vancomycin 1000 mg IV.  Forestine Na clinical pharmacist will complete review during morning rounds to assess patient and finalize treatment regimen.  Norberto Sorenson, [facility] 09/18/2016,1:43 AM

## 2016-09-18 NOTE — Progress Notes (Signed)
PROGRESS NOTE                                                                                                                                                                                                             Patient Demographics:    Meghan Simmons, is a 51 y.o. female, DOB - [birth year], [id number]  Admit date - 09/17/2016   Admitting Physician [name], MD  Outpatient Primary MD for the patient is [name], NP  LOS - 1  Chief Complaint  Patient presents with  . Rash       Brief Narrative - this  is a 51 y.o. female with medical history significant of HTN and depression with recent treatment for persistent and worsening rash.  Patient started with a rash in November. [facility] and was treated for bed bugs then scabies without any benefit, her rash continued unabated.  Her rash is now confluent all over her body Her face is somewhat spared, it is itchy, dry scaly and exfoliative.   Subjective:    Meghan Simmons today has, No headache, No chest pain, No abdominal pain - No Nausea, No new weakness tingling or numbness, No Cough - SOB. Itching better.   Assessment  & Plan :     1.Generalized dry scaly, itchy, exfoliative rash with signs of dehydration and hypotension. Could have had some secondary infection. 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 Continue supportive care for itching, will also apply high potency steroid cream to right arm only to look at defect.  2. Depression. On Celexa continue.  3. Hypertension. Blood pressure soft. Hold ACE inhibitor and hydrate.    Family Communication  :  None  Code  Status :  Full  Diet : Diet regular Room service appropriate? Yes; Fluid consistency: Thin   Disposition Plan  :  Stay inpt  Consults  :  None  Procedures  :  Skin Biopsy in the ER  DVT Prophylaxis  :  Lovenox   Lab Results  Component Value Date   PLT 338 09/18/2016    Inpatient Medications  Scheduled Meds: .  bacitracin   Topical BID  . citalopram  10 mg Oral Daily  . enoxaparin (LOVENOX) injection  40 mg Subcutaneous Q24H  . methylPREDNISolone (SOLU-MEDROL) injection  60 mg Intravenous Q12H  . nystatin   Topical TID  . vancomycin  1,000 mg Intravenous Q12H   Continuous Infusions: . sodium chloride 100 mL/hr at 09/18/16 0958   PRN Meds:.acetaminophen **OR** acetaminophen, aspirin, hydrOXYzine, morphine injection, ondansetron **OR** ondansetron (ZOFRAN) IV  Antibiotics  :    Anti-infectives    Start     Dose/Rate Route Frequency Ordered Stop   09/18/16 1000  vancomycin (VANCOCIN) IVPB 1000 mg/200 mL premix     1,000 mg 200 mL/hr over 60 Minutes Intravenous Every 12 hours 09/18/16 0740     09/18/16 0145  vancomycin (VANCOCIN) IVPB 1000 mg/200 mL premix     1,000 mg 200 mL/hr over 60 Minutes Intravenous  Once 09/18/16 0141 09/18/16 0323         Objective:   Vitals:   09/17/16 2000 09/17/16 2029 09/17/16 2239 09/18/16 0117  BP: (!) 94/54  (!) 92/53 (!) 94/50  Pulse: 104  100 84  Resp:   22 20  Temp:  98 F (36.7 C)  98.6 F (37 C)  TempSrc:  Oral  Oral  SpO2: 96%  98% 96%  Weight:    97.3 kg (214 lb 9.6 oz)  Height:    5\' 5"  (1.651 m)    Wt Readings from Last 3 Encounters:  09/18/16 97.3 kg (214 lb 9.6 oz)  09/13/16 91.8 kg (202 lb 4.8 oz)  09/12/16 90.7 kg (200 lb)     Intake/Output Summary (Last 24 hours) at 09/18/16 1057 Last data filed at 09/18/16 0900  Gross per 24 hour  Intake              440 ml  Output                0 ml  Net              440 ml     Physical Exam  Awake Alert, Oriented X 3, No new F.N deficits, Normal  affect [place].AT,PERRAL Supple Neck,No JVD, No cervical lymphadenopathy appriciated.  Symmetrical Chest wall movement, Good air movement bilaterally, CTAB RRR,No Gallops,Rubs or new Murmurs, No Parasternal Heave +ve B.Sounds, Abd Soft, No tenderness, No organomegaly appriciated, No rebound - guarding or rigidity. No Cyanosis, Clubbing or edema,  Generalized dry scaly exfoliative rash all over except face      Data Review:    CBC  Recent Labs Lab 09/13/16 0858 09/14/16 0503 09/17/16 1815 09/18/16 0631  WBC 18.7* 14.1* 19.4* 15.7*  HGB 13.6 11.1* 12.4 10.9*  HCT 40.7 34.7* 37.5 32.3*  PLT 424* 370 432* 338  MCV 90.8 91.3 90.8 90.7  MCH 30.4 29.2 30.0 30.6  MCHC 33.4 32.0 33.1 33.7  RDW 12.7 12.6 13.0 12.9  LYMPHSABS 1.0  --  2.2  --   MONOABS 0.9  --  0.8  --   EOSABS 0.1  --  0.2  --   BASOSABS 0.0  --  0.0  --     Chemistries   Recent Labs Lab 09/13/16 0858 09/13/16 1135 09/14/16 0503 09/17/16 1815 09/18/16 0631  NA 132* 132* 133* 132* 134*  K 4.1 4.2 4.3 3.5 4.1  CL 96* 98* 102 100* 104  CO2 23 24 25 24 24   GLUCOSE 109* 103* 120* 123* 170*  BUN 20 18 16 9 11   CREATININE  0.85 0.79 0.73 0.84 0.66  CALCIUM 8.8* 8.5* 7.9* 7.8* 7.5*  AST  --   --   --  23  --   ALT  --   --   --  13*  --   ALKPHOS  --   --   --  68  --   BILITOT  --   --   --  0.5  --    ------------------------------------------------------------------------------------------------------------------ No results for input(s): CHOL, HDL, LDLCALC, TRIG, CHOLHDL, LDLDIRECT in the last 72 hours.  No results found for: HGBA1C ------------------------------------------------------------------------------------------------------------------ No results for input(s): TSH, T4TOTAL, T3FREE, THYROIDAB in the last 72 hours.  Invalid input(s): FREET3 ------------------------------------------------------------------------------------------------------------------ No results for input(s): VITAMINB12,  FOLATE, FERRITIN, TIBC, IRON, RETICCTPCT in the last 72 hours.  Coagulation profile  Recent Labs Lab 09/17/16 2049  INR 1.13    No results for input(s): DDIMER in the last 72 hours.  Cardiac Enzymes No results for input(s): CKMB, TROPONINI, MYOGLOBIN in the last 168 hours.  Invalid input(s): CK ------------------------------------------------------------------------------------------------------------------ No results found for: BNP  Micro Results Recent Results (from the past 240 hour(s))  Blood culture (routine x 2)     Status: None (Preliminary result)   Collection Time: 09/13/16  8:58 AM  Result Value Ref Range Status   Specimen Description BLOOD RIGHT ANTECUBITAL  Final   Special Requests BOTTLES DRAWN AEROBIC AND ANAEROBIC 6CCEACH  Final   Culture NO GROWTH 4 DAYS  Final   Report Status PENDING  Incomplete  Blood culture (routine x 2)     Status: None (Preliminary result)   Collection Time: 09/13/16  9:09 AM  Result Value Ref Range Status   Specimen Description BLOOD LEFT ANTECUBITAL  Final   Special Requests BOTTLES DRAWN AEROBIC AND ANAEROBIC 10 CC EACH  Final   Culture NO GROWTH 4 DAYS  Final   Report Status PENDING  Incomplete  Culture, blood (Routine X 2) w Reflex to ID Panel     Status: None (Preliminary result)   Collection Time: 09/17/16  8:28 PM  Result Value Ref Range Status   Specimen Description BLOOD LEFT ARM  Final   Special Requests BOTTLES DRAWN AEROBIC AND ANAEROBIC [place]  Final   Culture PENDING  Incomplete   Report Status PENDING  Incomplete  Culture, blood (Routine X 2) w Reflex to ID Panel     Status: None (Preliminary result)   Collection Time: 09/17/16  8:49 PM  Result Value Ref Range Status   Specimen Description BLOOD LEFT FOREARM  Final   Special Requests BOTTLES DRAWN AEROBIC AND ANAEROBIC [facility]  Final   Culture PENDING  Incomplete   Report Status PENDING  Incomplete    Radiology Reports Dg Chest Port 1 View  Result Date:  09/13/2016 CLINICAL DATA:  Dyspnea. EXAM: PORTABLE CHEST 1 VIEW COMPARISON:  Chest CT dated 02/16/2015 FINDINGS: Chronic prominence of the main pulmonary arteries. Overall heart size and pulmonary vascularity are normal. No infiltrates or effusions. No bone abnormality. IMPRESSION: No acute abnormality. Chronic prominence of the main pulmonary arteries. Electronically Signed   By: Lorriane Shire M.D.   On: 09/13/2016 09:44    Time Spent in minutes  30   Varsha Knock K M.D on 09/18/2016 at 10:57 AM  Between 7am to 7pm - Pager - [phone]  After 7pm go to [email or web address] - password [facility]  [facility] -  Office  [phone]

## 2016-09-18 NOTE — Progress Notes (Signed)
Critical lab results received from lab, lactic acid 2.2; notified Dr.Singh of results.

## 2016-09-19 LAB — CBC
HEMATOCRIT: 28.8 % — AB (ref 36.0–46.0)
HEMOGLOBIN: 9.5 g/dL — AB (ref 12.0–15.0)
MCH: 30 pg (ref 26.0–34.0)
MCHC: 33 g/dL (ref 30.0–36.0)
MCV: 90.9 fL (ref 78.0–100.0)
Platelets: 365 10*3/uL (ref 150–400)
RBC: 3.17 MIL/uL — ABNORMAL LOW (ref 3.87–5.11)
RDW: 13 % (ref 11.5–15.5)
WBC: 16.3 10*3/uL — AB (ref 4.0–10.5)

## 2016-09-19 LAB — BASIC METABOLIC PANEL
ANION GAP: 4 — AB (ref 5–15)
BUN: 13 mg/dL (ref 6–20)
CO2: 25 mmol/L (ref 22–32)
Calcium: 8 mg/dL — ABNORMAL LOW (ref 8.9–10.3)
Chloride: 108 mmol/L (ref 101–111)
Creatinine, Ser: 0.63 mg/dL (ref 0.44–1.00)
GFR calc non Af Amer: >60 mL/min (ref >60)
Glucose, Bld: 144 mg/dL — ABNORMAL HIGH (ref 65–99)
Potassium: 3.9 mmol/L (ref 3.5–5.1)
SODIUM: 137 mmol/L (ref 135–145)

## 2016-09-19 LAB — CULTURE, BLOOD (ROUTINE X 2)
Culture: NO GROWTH
Culture: NO GROWTH

## 2016-09-19 LAB — URINE CULTURE

## 2016-09-19 LAB — MRSA PCR SCREENING: MRSA BY PCR: NEGATIVE

## 2016-09-19 MED ORDER — POTASSIUM CHLORIDE IN NACL 20-0.9 MEQ/L-% IV SOLN
INTRAVENOUS | Status: DC
  Administered 2016-09-19 – 2016-09-21 (×4): via INTRAVENOUS
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
## 2016-09-19 NOTE — Progress Notes (Signed)
PROGRESS NOTE                                                                                                                                                                                                             Patient Demographics:    Meghan Simmons, is a 51 y.o. female, DOB - 09-Dec-1965, [id number]  Admit date - 09/17/2016   Admitting Physician [name], MD  Outpatient Primary MD for the patient is [name], NP  LOS - 2  Chief Complaint  Patient presents with  . Rash       Brief Narrative - this  is a 51 y.o. female with medical history significant of HTN and depression with recent treatment for persistent and worsening rash.  Patient started with a rash in November. [facility] and was treated for bed bugs then scabies without any benefit, her rash continued unabated.  Her rash is now confluent all over her body Her face is somewhat spared, it is itchy, dry scaly and exfoliative.   Subjective:    Meghan Simmons today has, No headache, No chest pain, No abdominal pain - No Nausea, No new weakness tingling or numbness, No Cough - SOB. Itching better.   Assessment  & Plan :     1.Generalized dry scaly, itchy, exfoliative rash with signs of dehydration and hypotension. Could have had some secondary infection. 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   Continue supportive care for itching, will also apply high potency steroid cream to right arm only to look at steroid effect, R.arm rash is improving.  2. Depression. On Celexa continue.  3. Hypertension. Blood pressure soft. Hold ACE inhibitor and  hydrate.    Family Communication  :  None  Code Status :  Full  Diet : Diet regular Room service appropriate? Yes; Fluid consistency: Thin   Disposition Plan  :  Stay inpt  Consults  :  None  Procedures  :  Skin Biopsy in the ER  DVT Prophylaxis  :  Lovenox   Lab Results  Component Value Date   PLT 365 09/19/2016  Inpatient Medications  Scheduled Meds: . bacitracin   Topical BID  . citalopram  10 mg Oral Daily  . clobetasol ointment   Topical QID  . enoxaparin (LOVENOX) injection  40 mg Subcutaneous Q24H  . methylPREDNISolone (SOLU-MEDROL) injection  60 mg Intravenous Q12H  . nystatin   Topical TID  . vancomycin  1,000 mg Intravenous Q12H   Continuous Infusions: . 0.9 % NaCl with KCl 20 mEq / L     PRN Meds:.acetaminophen **OR** acetaminophen, aspirin, hydrOXYzine, morphine injection, ondansetron **OR** ondansetron (ZOFRAN) IV  Antibiotics  :    Anti-infectives    Start     Dose/Rate Route Frequency Ordered Stop   09/18/16 1000  vancomycin (VANCOCIN) IVPB 1000 mg/200 mL premix     1,000 mg 200 mL/hr over 60 Minutes Intravenous Every 12 hours 09/18/16 0740     09/18/16 0145  vancomycin (VANCOCIN) IVPB 1000 mg/200 mL premix     1,000 mg 200 mL/hr over 60 Minutes Intravenous  Once 09/18/16 0141 09/18/16 0323         Objective:   Vitals:   09/18/16 0117 09/18/16 1504 09/18/16 2119 09/19/16 0500  BP: (!) 94/50 (!) 105/54 102/70 (!) 94/54  Pulse: 84 100 89 69  Resp: 20 20 20 20   Temp: 98.6 F (37 C) 98.6 F (37 C) 97.9 F (36.6 C) 97.3 F (36.3 C)  TempSrc: Oral Oral Oral Oral  SpO2: 96% 96% 100% 100%  Weight: 97.3 kg (214 lb 9.6 oz)     Height: 5\' 5"  (1.651 m)       Wt Readings from Last 3 Encounters:  09/18/16 97.3 kg (214 lb 9.6 oz)  09/13/16 91.8 kg (202 lb 4.8 oz)  09/12/16 90.7 kg (200 lb)     Intake/Output Summary (Last 24 hours) at 09/19/16 1001 Last data filed at 09/19/16 0700  Gross per 24 hour  Intake             2020 ml  Output                 0 ml  Net             2020 ml     Physical Exam  Awake Alert, Oriented X 3, No new F.N deficits, Normal affect [place].AT,PERRAL Supple Neck,No JVD, No cervical lymphadenopathy appriciated.  Symmetrical Chest wall movement, Good air movement bilaterally, CTAB RRR,No Gallops,Rubs or new Murmurs, No Parasternal Heave +ve B.Sounds, Abd Soft, No tenderness, No organomegaly appriciated, No rebound - guarding or rigidity. No Cyanosis, Clubbing or edema,  Generalized dry scaly exfoliative rash all over except face      Data Review:    CBC  Recent Labs Lab 09/13/16 0858 09/14/16 0503 09/17/16 1815 09/18/16 0631 09/19/16 0621  WBC 18.7* 14.1* 19.4* 15.7* 16.3*  HGB 13.6 11.1* 12.4 10.9* 9.5*  HCT 40.7 34.7* 37.5 32.3* 28.8*  PLT 424* 370 432* 338 365  MCV 90.8 91.3 90.8 90.7 90.9  MCH 30.4 29.2 30.0 30.6 30.0  MCHC 33.4 32.0 33.1 33.7 33.0  RDW 12.7 12.6 13.0 12.9 13.0  LYMPHSABS 1.0  --  2.2  --   --   MONOABS 0.9  --  0.8  --   --   EOSABS 0.1  --  0.2  --   --   BASOSABS 0.0  --  0.0  --   --     Chemistries   Recent Labs Lab 09/13/16 1135 09/14/16 0503 09/17/16 1815 09/18/16 0631 09/19/16 [id number]  NA 132* 133* 132* 134* 137  K 4.2 4.3 3.5 4.1 3.9  CL 98* 102 100* 104 108  CO2 24 25 24 24 25   GLUCOSE 103* 120* 123* 170* 144*  BUN 18 16 9 11 13   CREATININE 0.79 0.73 0.84 0.66 0.63  CALCIUM 8.5* 7.9* 7.8* 7.5* 8.0*  AST  --   --  23  --   --   ALT  --   --  13*  --   --   ALKPHOS  --   --  68  --   --   BILITOT  --   --  0.5  --   --    ------------------------------------------------------------------------------------------------------------------ No results for input(s): CHOL, HDL, LDLCALC, TRIG, CHOLHDL, LDLDIRECT in the last 72 hours.  No results found for: HGBA1C ------------------------------------------------------------------------------------------------------------------ No results for input(s): TSH, T4TOTAL, T3FREE, THYROIDAB in the  last 72 hours.  Invalid input(s): FREET3 ------------------------------------------------------------------------------------------------------------------ No results for input(s): VITAMINB12, FOLATE, FERRITIN, TIBC, IRON, RETICCTPCT in the last 72 hours.  Coagulation profile  Recent Labs Lab 09/17/16 2049  INR 1.13    No results for input(s): DDIMER in the last 72 hours.  Cardiac Enzymes No results for input(s): CKMB, TROPONINI, MYOGLOBIN in the last 168 hours.  Invalid input(s): CK ------------------------------------------------------------------------------------------------------------------ No results found for: BNP  Micro Results Recent Results (from the past 240 hour(s))  Blood culture (routine x 2)     Status: None   Collection Time: 09/13/16  8:58 AM  Result Value Ref Range Status   Specimen Description BLOOD RIGHT ANTECUBITAL  Final   Special Requests BOTTLES DRAWN AEROBIC AND ANAEROBIC 6CCEACH  Final   Culture NO GROWTH 6 DAYS  Final   Report Status 09/19/2016 FINAL  Final  Blood culture (routine x 2)     Status: None   Collection Time: 09/13/16  9:09 AM  Result Value Ref Range Status   Specimen Description BLOOD LEFT ANTECUBITAL  Final   Special Requests BOTTLES DRAWN AEROBIC AND ANAEROBIC 10 CC EACH  Final   Culture NO GROWTH 6 DAYS  Final   Report Status 09/19/2016 FINAL  Final  Culture, blood (Routine X 2) w Reflex to ID Panel     Status: None (Preliminary result)   Collection Time: 09/17/16  8:28 PM  Result Value Ref Range Status   Specimen Description BLOOD LEFT ARM  Final   Special Requests BOTTLES DRAWN AEROBIC AND ANAEROBIC [place]  Final   Culture NO GROWTH 2 DAYS  Final   Report Status PENDING  Incomplete  Culture, blood (Routine X 2) w Reflex to ID Panel     Status: None (Preliminary result)   Collection Time: 09/17/16  8:49 PM  Result Value Ref Range Status   Specimen Description BLOOD LEFT FOREARM  Final   Special Requests BOTTLES DRAWN AEROBIC  AND ANAEROBIC [place]  Final   Culture NO GROWTH 2 DAYS  Final   Report Status PENDING  Incomplete  MRSA PCR Screening     Status: None   Collection Time: 09/19/16  2:46 AM  Result Value Ref Range Status   MRSA by PCR NEGATIVE NEGATIVE Final    Comment:        The GeneXpert MRSA Assay (FDA approved for NASAL specimens only), is one component of a comprehensive MRSA colonization surveillance program. It is not intended to diagnose MRSA infection nor to guide or monitor treatment for MRSA infections.     Radiology Reports Dg Chest Port 1 View  Result Date: 09/13/2016  CLINICAL DATA:  Dyspnea. EXAM: PORTABLE CHEST 1 VIEW COMPARISON:  Chest CT dated 02/16/2015 FINDINGS: Chronic prominence of the main pulmonary arteries. Overall heart size and pulmonary vascularity are normal. No infiltrates or effusions. No bone abnormality. IMPRESSION: No acute abnormality. Chronic prominence of the main pulmonary arteries. Electronically Signed   By: Lorriane Shire M.D.   On: 09/13/2016 09:44    Time Spent in minutes  30   Layth Cerezo K M.D on 09/19/2016 at 10:01 AM  Between 7am to 7pm - Pager - [phone]  After 7pm go to [email or web address] - password [facility]  [facility] -  Office  [phone]

## 2016-09-20 LAB — BASIC METABOLIC PANEL
ANION GAP: 6 (ref 5–15)
BUN: 15 mg/dL (ref 6–20)
CHLORIDE: 109 mmol/L (ref 101–111)
CO2: 24 mmol/L (ref 22–32)
Calcium: 8.3 mg/dL — ABNORMAL LOW (ref 8.9–10.3)
Creatinine, Ser: 0.81 mg/dL (ref 0.44–1.00)
GFR calc non Af Amer: >60 mL/min (ref >60)
Glucose, Bld: 130 mg/dL — ABNORMAL HIGH (ref 65–99)
Potassium: 4.2 mmol/L (ref 3.5–5.1)
Sodium: 139 mmol/L (ref 135–145)

## 2016-09-20 LAB — CBC
HEMATOCRIT: 30.5 % — AB (ref 36.0–46.0)
HEMOGLOBIN: 10 g/dL — AB (ref 12.0–15.0)
MCH: 30.3 pg (ref 26.0–34.0)
MCHC: 32.8 g/dL (ref 30.0–36.0)
MCV: 92.4 fL (ref 78.0–100.0)
Platelets: 423 10*3/uL — ABNORMAL HIGH (ref 150–400)
RBC: 3.3 MIL/uL — ABNORMAL LOW (ref 3.87–5.11)
RDW: 13.4 % (ref 11.5–15.5)
WBC: 18.9 10*3/uL — AB (ref 4.0–10.5)

## 2016-09-20 NOTE — Progress Notes (Signed)
PROGRESS NOTE                                                                                                                                                                                                             Patient Demographics:    Meghan Simmons, is a 51 y.o. female, DOB - 01-Feb-1966, [id number]  Admit date - 09/17/2016   Admitting Physician [name], MD  Outpatient Primary MD for the patient is [name], NP  LOS - 3  Chief Complaint  Patient presents with  . Rash       Brief Narrative - this  is a 51 y.o. female with medical history significant of HTN and depression with recent treatment for persistent and worsening rash.  Patient started with a rash in November. [facility] and was treated for bed bugs then scabies without any benefit, her rash continued unabated.  Her rash is now confluent all over her body Her face is somewhat spared, it is itchy, dry scaly and exfoliative.   Subjective:    Meghan Simmons today has, No headache, No chest pain, No abdominal pain - No Nausea, No new weakness tingling or numbness, No Cough - SOB. Itching better.   Assessment  & Plan :     1.Generalized dry scaly, itchy, exfoliative rash with signs of dehydration and hypotension. Could have had some secondary infection. 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   right arm is improving.  Skin biopsy results are pending.  Will discuss with dermatology in am for further recommendations.   2. Depression. On Celexa continue.  3. Hypertension. Well controlled.     Family Communication  :  None  Code Status :  Full  Diet :  Diet regular Room service appropriate? Yes; Fluid consistency: Thin   Disposition Plan  :  Stay inpt  Consults  :  None  Procedures  :  Skin Biopsy in the ER  DVT Prophylaxis  :  Lovenox   Lab Results  Component Value Date   PLT 423 (H) 09/20/2016    Inpatient Medications  Scheduled Meds: .  bacitracin   Topical BID  . citalopram  10 mg Oral Daily  . clobetasol ointment   Topical QID  . enoxaparin (LOVENOX) injection  40 mg Subcutaneous Q24H  . methylPREDNISolone (SOLU-MEDROL) injection  60 mg Intravenous Q12H  . nystatin   Topical TID  . vancomycin  1,000 mg Intravenous Q12H   Continuous Infusions: . 0.9 % NaCl with KCl 20 mEq / L 75 mL/hr at 09/20/16 0127   PRN Meds:.acetaminophen **OR** acetaminophen, aspirin, hydrOXYzine, morphine injection, ondansetron **OR** ondansetron (ZOFRAN) IV  Antibiotics  :    Anti-infectives    Start     Dose/Rate Route Frequency Ordered Stop   09/18/16 1000  vancomycin (VANCOCIN) IVPB 1000 mg/200 mL premix     1,000 mg 200 mL/hr over 60 Minutes Intravenous Every 12 hours 09/18/16 0740     09/18/16 0145  vancomycin (VANCOCIN) IVPB 1000 mg/200 mL premix     1,000 mg 200 mL/hr over 60 Minutes Intravenous  Once 09/18/16 0141 09/18/16 0323         Objective:   Vitals:   09/19/16 1415 09/19/16 2125 09/20/16 0537 09/20/16 1600  BP: 131/77 124/78 134/84 131/84  Pulse: 80 64 67   Resp: 20 20 20    Temp: 97.6 F (36.4 C) 97.9 F (36.6 C) 97.5 F (36.4 C) 98.5 F (36.9 C)  TempSrc: Oral Oral Oral Oral  SpO2: 99% 100% 99% 99%  Weight:      Height:        Wt Readings from Last 3 Encounters:  09/18/16 97.3 kg (214 lb 9.6 oz)  09/13/16 91.8 kg (202 lb 4.8 oz)  09/12/16 90.7 kg (200 lb)     Intake/Output Summary (Last 24 hours) at 09/20/16 1719 Last data filed at 09/20/16 1400  Gross per 24 hour  Intake          1438.75 ml  Output                0 ml  Net          1438.75 ml     Physical Exam  Awake Alert, Oriented X 3, No  new F.N deficits, Normal affect [place].AT,PERRAL Supple Neck,No JVD, No cervical lymphadenopathy appriciated.  Symmetrical Chest wall movement, Good air movement bilaterally, CTAB RRR,No Gallops,Rubs or new Murmurs, No Parasternal Heave +ve B.Sounds, Abd Soft, No tenderness, No organomegaly appriciated, No rebound - guarding or rigidity. No Cyanosis, Clubbing or edema,  Generalized dry scaly exfoliative rash all over except face      Data Review:    CBC  Recent Labs Lab 09/14/16 0503 09/17/16 1815 09/18/16 0631 09/19/16 0621 09/20/16 0512  WBC 14.1* 19.4* 15.7* 16.3* 18.9*  HGB 11.1* 12.4 10.9* 9.5* 10.0*  HCT 34.7* 37.5 32.3* 28.8* 30.5*  PLT 370 432* 338 365 423*  MCV 91.3 90.8 90.7 90.9 92.4  MCH 29.2 30.0 30.6 30.0 30.3  MCHC 32.0 33.1 33.7 33.0 32.8  RDW 12.6 13.0 12.9 13.0 13.4  LYMPHSABS  --  2.2  --   --   --   MONOABS  --  0.8  --   --   --   EOSABS  --  0.2  --   --   --   BASOSABS  --  0.0  --   --   --     Chemistries   Recent Labs Lab 09/14/16 0503 09/17/16 1815 09/18/16 0631 09/19/16 0621 09/20/16 0512  NA 133* 132* 134* 137 139  K 4.3 3.5 4.1  3.9 4.2  CL 102 100* 104 108 109  CO2 25 24 24 25 24   GLUCOSE 120* 123* 170* 144* 130*  BUN 16 9 11 13 15   CREATININE 0.73 0.84 0.66 0.63 0.81  CALCIUM 7.9* 7.8* 7.5* 8.0* 8.3*  AST  --  23  --   --   --   ALT  --  13*  --   --   --   ALKPHOS  --  68  --   --   --   BILITOT  --  0.5  --   --   --    ------------------------------------------------------------------------------------------------------------------ No results for input(s): CHOL, HDL, LDLCALC, TRIG, CHOLHDL, LDLDIRECT in the last 72 hours.  No results found for: HGBA1C ------------------------------------------------------------------------------------------------------------------ No results for input(s): TSH, T4TOTAL, T3FREE, THYROIDAB in the last 72 hours.  Invalid input(s):  FREET3 ------------------------------------------------------------------------------------------------------------------ No results for input(s): VITAMINB12, FOLATE, FERRITIN, TIBC, IRON, RETICCTPCT in the last 72 hours.  Coagulation profile  Recent Labs Lab 09/17/16 2049  INR 1.13    No results for input(s): DDIMER in the last 72 hours.  Cardiac Enzymes No results for input(s): CKMB, TROPONINI, MYOGLOBIN in the last 168 hours.  Invalid input(s): CK ------------------------------------------------------------------------------------------------------------------ No results found for: BNP  Micro Results Recent Results (from the past 240 hour(s))  Blood culture (routine x 2)     Status: None   Collection Time: 09/13/16  8:58 AM  Result Value Ref Range Status   Specimen Description BLOOD RIGHT ANTECUBITAL  Final   Special Requests BOTTLES DRAWN AEROBIC AND ANAEROBIC 6CCEACH  Final   Culture NO GROWTH 6 DAYS  Final   Report Status 09/19/2016 FINAL  Final  Blood culture (routine x 2)     Status: None   Collection Time: 09/13/16  9:09 AM  Result Value Ref Range Status   Specimen Description BLOOD LEFT ANTECUBITAL  Final   Special Requests BOTTLES DRAWN AEROBIC AND ANAEROBIC 10 CC EACH  Final   Culture NO GROWTH 6 DAYS  Final   Report Status 09/19/2016 FINAL  Final  Culture, blood (Routine X 2) w Reflex to ID Panel     Status: None (Preliminary result)   Collection Time: 09/17/16  8:28 PM  Result Value Ref Range Status   Specimen Description BLOOD LEFT ARM  Final   Special Requests BOTTLES DRAWN AEROBIC AND ANAEROBIC [place]  Final   Culture NO GROWTH 3 DAYS  Final   Report Status PENDING  Incomplete  Culture, blood (Routine X 2) w Reflex to ID Panel     Status: None (Preliminary result)   Collection Time: 09/17/16  8:49 PM  Result Value Ref Range Status   Specimen Description BLOOD LEFT FOREARM  Final   Special Requests BOTTLES DRAWN AEROBIC AND ANAEROBIC [place]  Final   Culture  NO GROWTH 3 DAYS  Final   Report Status PENDING  Incomplete  Culture, Urine     Status: Abnormal   Collection Time: 09/17/16 10:19 PM  Result Value Ref Range Status   Specimen Description URINE, CLEAN CATCH  Final   Special Requests NONE  Final   Culture MULTIPLE SPECIES PRESENT, SUGGEST RECOLLECTION (A)  Final   Report Status 09/19/2016 FINAL  Final  MRSA PCR Screening     Status: None   Collection Time: 09/19/16  2:46 AM  Result Value Ref Range Status   MRSA by PCR NEGATIVE NEGATIVE Final    Comment:        The GeneXpert MRSA Assay (FDA approved  for NASAL specimens only), is one component of a comprehensive MRSA colonization surveillance program. It is not intended to diagnose MRSA infection nor to guide or monitor treatment for MRSA infections.     Radiology Reports Dg Chest Port 1 View  Result Date: 09/13/2016 CLINICAL DATA:  Dyspnea. EXAM: PORTABLE CHEST 1 VIEW COMPARISON:  Chest CT dated 02/16/2015 FINDINGS: Chronic prominence of the main pulmonary arteries. Overall heart size and pulmonary vascularity are normal. No infiltrates or effusions. No bone abnormality. IMPRESSION: No acute abnormality. Chronic prominence of the main pulmonary arteries. Electronically Signed   By: Lorriane Shire M.D.   On: 09/13/2016 09:44    Time Spent in minutes  47   Shawne Eskelson M.D on 09/20/2016 at 5:19 PM  Between 7am to 7pm - Pager - [phone]  After 7pm go to [email or web address] - password [facility]  [facility] -  Office  [phone]

## 2016-09-20 NOTE — Progress Notes (Signed)
Report given to oncoming nurse, nothing needed at this time.

## 2016-09-20 NOTE — Progress Notes (Signed)
Present with patient for emotional and spiritual support. Meghan Simmons shared about her current home/living situation. Will continue to offer support.

## 2016-09-21 LAB — VANCOMYCIN, TROUGH: Vancomycin Tr: 9 ug/mL — ABNORMAL LOW (ref 15–20)

## 2016-09-21 MED ORDER — VANCOMYCIN HCL 10 G IV SOLR
1500.0000 mg | Freq: Two times a day (BID) | INTRAVENOUS | Status: DC
  Administered 2016-09-21 – 2016-09-22 (×3): 1500 mg via INTRAVENOUS
  Filled 2016-09-21 (×9): qty 1500

## 2016-09-21 NOTE — Progress Notes (Signed)
PROGRESS NOTE                                                                                                                                                                                                             Patient Demographics:    Meghan Simmons, is a 51 y.o. female, DOB - 1965-11-18, [id number]  Admit date - 09/17/2016   Admitting Physician [name], MD  Outpatient Primary MD for the patient is [name], NP  LOS - 4  Chief Complaint  Patient presents with  . Rash       Brief Narrative - this  is a 51 y.o. female with medical history significant of HTN and depression with recent treatment for persistent and worsening rash.  Patient started with a rash in November. She went [facility] and was treated for bed bugs then scabies without any benefit, her rash continued unabated.  Her rash is now confluent all over her body Her face is somewhat spared, it is itchy, dry scaly and exfoliative.   Subjective:    Meghan Simmons the rash is better, itching is better. But reports worsening pedal edema.    Assessment  & Plan :     1.Generalized dry scaly, itchy, exfoliative rash with signs of dehydration and hypotension. Suspect super infection on top of rash.  Skin biopsy shows hypersensitivity reaction. Stop IV fluids as her pedal edema is getting worse.  Resume IV antibiotics for another 24 hours. Will start tapering steroids in am.  Apply clobestol over the rash.    2. Depression. On Celexa continue.  3. Hypertension. Well controlled. No new changes in meds.   4. Normocytic anemia: stable around 10.   5. Leukocytosis: suspect from steroids.     Family Communication  :  None  Code Status :  Full  Diet : Diet regular Room service appropriate? Yes; Fluid consistency: Thin   Disposition Plan  :  Stay inpt  Consults  :  None  Procedures  :  Skin Biopsy in the ER  DVT  Prophylaxis  :  Lovenox   Lab Results  Component Value Date   PLT 423 (H) 09/20/2016    Inpatient Medications  Scheduled Meds: . bacitracin   Topical BID  . citalopram  10 mg Oral Daily  . clobetasol ointment   Topical QID  .  enoxaparin (LOVENOX) injection  40 mg Subcutaneous Q24H  . methylPREDNISolone (SOLU-MEDROL) injection  60 mg Intravenous Q12H  . nystatin   Topical TID  . vancomycin  1,500 mg Intravenous Q12H   Continuous Infusions:  PRN Meds:.acetaminophen **OR** acetaminophen, hydrOXYzine, morphine injection, ondansetron **OR** ondansetron (ZOFRAN) IV  Antibiotics  :    Anti-infectives    Start     Dose/Rate Route Frequency Ordered Stop   09/21/16 1030  vancomycin (VANCOCIN) 1,500 mg in sodium chloride 0.9 % 500 mL IVPB     1,500 mg 250 mL/hr over 120 Minutes Intravenous Every 12 hours 09/21/16 1018     09/18/16 1000  vancomycin (VANCOCIN) IVPB 1000 mg/200 mL premix  Status:  Discontinued     1,000 mg 200 mL/hr over 60 Minutes Intravenous Every 12 hours 09/18/16 0740 09/21/16 1018   09/18/16 0145  vancomycin (VANCOCIN) IVPB 1000 mg/200 mL premix     1,000 mg 200 mL/hr over 60 Minutes Intravenous  Once 09/18/16 0141 09/18/16 0323         Objective:   Vitals:   09/20/16 1600 09/21/16 0000 09/21/16 0559 09/21/16 1335  BP: 131/84 126/72 135/87 (!) 140/91  Pulse:  68 65 70  Resp:  20 20 19   Temp: 98.5 F (36.9 C) 98.7 F (37.1 C) 97.5 F (36.4 C) 98 F (36.7 C)  TempSrc: Oral Oral Oral Oral  SpO2: 99% 98% 97% 98%  Weight:      Height:        Wt Readings from Last 3 Encounters:  09/18/16 97.3 kg (214 lb 9.6 oz)  09/13/16 91.8 kg (202 lb 4.8 oz)  09/12/16 90.7 kg (200 lb)     Intake/Output Summary (Last 24 hours) at 09/21/16 1449 Last data filed at 09/21/16 1219  Gross per 24 hour  Intake              800 ml  Output                0 ml  Net              800 ml     Physical Exam  Awake Alert, Oriented X 3, No new F.N deficits, Normal  affect Meghan Simmons.AT,PERRAL Supple Neck,No JVD, No cervical lymphadenopathy appriciated.  Symmetrical Chest wall movement, Good air movement bilaterally, CTAB RRR,No Gallops,Rubs or new Murmurs, No Parasternal Heave +ve B.Sounds, Abd Soft, No tenderness, No organomegaly appriciated, No rebound - guarding or rigidity. No Cyanosis, Clubbing or edema,  Generalized dry scaly exfoliative rash all over except face      Data Review:    CBC  Recent Labs Lab 09/17/16 1815 09/18/16 0631 09/19/16 0621 09/20/16 0512  WBC 19.4* 15.7* 16.3* 18.9*  HGB 12.4 10.9* 9.5* 10.0*  HCT 37.5 32.3* 28.8* 30.5*  PLT 432* 338 365 423*  MCV 90.8 90.7 90.9 92.4  MCH 30.0 30.6 30.0 30.3  MCHC 33.1 33.7 33.0 32.8  RDW 13.0 12.9 13.0 13.4  LYMPHSABS 2.2  --   --   --   MONOABS 0.8  --   --   --   EOSABS 0.2  --   --   --   BASOSABS 0.0  --   --   --     Chemistries   Recent Labs Lab 09/17/16 1815 09/18/16 0631 09/19/16 0621 09/20/16 0512  NA 132* 134* 137 139  K 3.5 4.1 3.9 4.2  CL 100* 104 108 109  CO2 24 24 25 24   GLUCOSE 123*  170* 144* 130*  BUN 9 11 13 15   CREATININE 0.84 0.66 0.63 0.81  CALCIUM 7.8* 7.5* 8.0* 8.3*  AST 23  --   --   --   ALT 13*  --   --   --   ALKPHOS 68  --   --   --   BILITOT 0.5  --   --   --    ------------------------------------------------------------------------------------------------------------------ No results for input(s): CHOL, HDL, LDLCALC, TRIG, CHOLHDL, LDLDIRECT in the last 72 hours.  No results found for: HGBA1C ------------------------------------------------------------------------------------------------------------------ No results for input(s): TSH, T4TOTAL, T3FREE, THYROIDAB in the last 72 hours.  Invalid input(s): FREET3 ------------------------------------------------------------------------------------------------------------------ No results for input(s): VITAMINB12, FOLATE, FERRITIN, TIBC, IRON, RETICCTPCT in the last 72  hours.  Coagulation profile  Recent Labs Lab 09/17/16 2049  INR 1.13    No results for input(s): DDIMER in the last 72 hours.  Cardiac Enzymes No results for input(s): CKMB, TROPONINI, MYOGLOBIN in the last 168 hours.  Invalid input(s): CK ------------------------------------------------------------------------------------------------------------------ No results found for: BNP  Micro Results Recent Results (from the past 240 hour(s))  Blood culture (routine x 2)     Status: None   Collection Time: 09/13/16  8:58 AM  Result Value Ref Range Status   Specimen Description BLOOD RIGHT ANTECUBITAL  Final   Special Requests BOTTLES DRAWN AEROBIC AND ANAEROBIC 6CCEACH  Final   Culture NO GROWTH 6 DAYS  Final   Report Status 09/19/2016 FINAL  Final  Blood culture (routine x 2)     Status: None   Collection Time: 09/13/16  9:09 AM  Result Value Ref Range Status   Specimen Description BLOOD LEFT ANTECUBITAL  Final   Special Requests BOTTLES DRAWN AEROBIC AND ANAEROBIC 10 CC EACH  Final   Culture NO GROWTH 6 DAYS  Final   Report Status 09/19/2016 FINAL  Final  Culture, blood (Routine X 2) w Reflex to ID Panel     Status: None (Preliminary result)   Collection Time: 09/17/16  8:28 PM  Result Value Ref Range Status   Specimen Description BLOOD LEFT ARM  Final   Special Requests BOTTLES DRAWN AEROBIC AND ANAEROBIC [place]  Final   Culture NO GROWTH 4 DAYS  Final   Report Status PENDING  Incomplete  Culture, blood (Routine X 2) w Reflex to ID Panel     Status: None (Preliminary result)   Collection Time: 09/17/16  8:49 PM  Result Value Ref Range Status   Specimen Description BLOOD LEFT FOREARM  Final   Special Requests BOTTLES DRAWN AEROBIC AND ANAEROBIC [place]  Final   Culture NO GROWTH 4 DAYS  Final   Report Status PENDING  Incomplete  Culture, Urine     Status: Abnormal   Collection Time: 09/17/16 10:19 PM  Result Value Ref Range Status   Specimen Description URINE, CLEAN CATCH   Final   Special Requests NONE  Final   Culture MULTIPLE SPECIES PRESENT, SUGGEST RECOLLECTION (A)  Final   Report Status 09/19/2016 FINAL  Final  Culture, fungus without smear     Status: None (Preliminary result)   Collection Time: 09/18/16  1:17 AM  Result Value Ref Range Status   Specimen Description SCRAPING SKIN  Final   Special Requests Normal  Final   Culture   Final    NO FUNGUS ISOLATED AFTER 1 DAY Performed at [facility]    Report Status PENDING  Incomplete  Aerobic Culture (superficial specimen)     Status: None (Preliminary result)  Collection Time: 09/18/16  1:17 AM  Result Value Ref Range Status   Specimen Description SCRAPING SKIN  Final   Special Requests Normal  Final   Gram Stain   Final    NO WBC SEEN RARE SQUAMOUS EPITHELIAL CELLS PRESENT RARE GRAM POSITIVE COCCI    Culture   Final    TOO YOUNG TO READ Performed at [facility]    Report Status PENDING  Incomplete  MRSA PCR Screening     Status: None   Collection Time: 09/19/16  2:46 AM  Result Value Ref Range Status   MRSA by PCR NEGATIVE NEGATIVE Final    Comment:        The GeneXpert MRSA Assay (FDA approved for NASAL specimens only), is one component of a comprehensive MRSA colonization surveillance program. It is not intended to diagnose MRSA infection nor to guide or monitor treatment for MRSA infections.     Radiology Reports Dg Chest Port 1 View  Result Date: 09/13/2016 CLINICAL DATA:  Dyspnea. EXAM: PORTABLE CHEST 1 VIEW COMPARISON:  Chest CT dated 02/16/2015 FINDINGS: Chronic prominence of the main pulmonary arteries. Overall heart size and pulmonary vascularity are normal. No infiltrates or effusions. No bone abnormality. IMPRESSION: No acute abnormality. Chronic prominence of the main pulmonary arteries. Electronically Signed   By: Lorriane Shire M.D.   On: 09/13/2016 09:44    Time Spent in minutes  30 minutes.    Kishia Shackett M.D on 09/21/2016 at 2:49  PM  Between 7am to 7pm - Pager - [phone]  After 7pm go to [email or web address] - password [email or web address]  [facility] -  Office  [phone]

## 2016-09-21 NOTE — Progress Notes (Signed)
ANTIBIOTIC CONSULT NOTE  Pharmacy Consult for Vancomycin Indication: Sepsis  Allergies  Allergen Reactions  . Erythromycin Nausea And Vomiting and Rash   Patient Measurements: Height: 5\' 5"  (165.1 cm) Weight: 214 lb 9.6 oz (97.3 kg) IBW/kg (Calculated) : 57  Vital Signs: Temp: 97.5 F (36.4 C) (01/04 0559) Temp Source: Oral (01/04 0559) BP: 135/87 (01/04 0559) Pulse Rate: 65 (01/04 0559)  Labs:  Recent Labs  09/19/16 0621 09/20/16 0512  WBC 16.3* 18.9*  HGB 9.5* 10.0*  PLT 365 423*  CREATININE 0.63 0.81   Estimated Creatinine Clearance: 98 mL/min (by C-G formula based on SCr of 0.81 mg/dL).   Recent Labs  09/21/16 0851  [place] 9*     Microbiology: Recent Results (from the past 720 hour(s))  Blood culture (routine x 2)     Status: None   Collection Time: 09/13/16  8:58 AM  Result Value Ref Range Status   Specimen Description BLOOD RIGHT ANTECUBITAL  Final   Special Requests BOTTLES DRAWN AEROBIC AND ANAEROBIC 6CCEACH  Final   Culture NO GROWTH 6 DAYS  Final   Report Status 09/19/2016 FINAL  Final  Blood culture (routine x 2)     Status: None   Collection Time: 09/13/16  9:09 AM  Result Value Ref Range Status   Specimen Description BLOOD LEFT ANTECUBITAL  Final   Special Requests BOTTLES DRAWN AEROBIC AND ANAEROBIC 10 CC EACH  Final   Culture NO GROWTH 6 DAYS  Final   Report Status 09/19/2016 FINAL  Final  Culture, blood (Routine X 2) w Reflex to ID Panel     Status: None (Preliminary result)   Collection Time: 09/17/16  8:28 PM  Result Value Ref Range Status   Specimen Description BLOOD LEFT ARM  Final   Special Requests BOTTLES DRAWN AEROBIC AND ANAEROBIC [place]  Final   Culture NO GROWTH 4 DAYS  Final   Report Status PENDING  Incomplete  Culture, blood (Routine X 2) w Reflex to ID Panel     Status: None (Preliminary result)   Collection Time: 09/17/16  8:49 PM  Result Value Ref Range Status   Specimen Description BLOOD LEFT FOREARM  Final   Special Requests BOTTLES DRAWN AEROBIC AND ANAEROBIC [place]  Final   Culture NO GROWTH 4 DAYS  Final   Report Status PENDING  Incomplete  Culture, Urine     Status: Abnormal   Collection Time: 09/17/16 10:19 PM  Result Value Ref Range Status   Specimen Description URINE, CLEAN CATCH  Final   Special Requests NONE  Final   Culture MULTIPLE SPECIES PRESENT, SUGGEST RECOLLECTION (A)  Final   Report Status 09/19/2016 FINAL  Final  Aerobic Culture (superficial specimen)     Status: None (Preliminary result)   Collection Time: 09/18/16  1:17 AM  Result Value Ref Range Status   Specimen Description SCRAPING SKIN  Final   Special Requests Normal  Final   Gram Stain   Final    NO WBC SEEN RARE SQUAMOUS EPITHELIAL CELLS PRESENT RARE GRAM POSITIVE COCCI    Culture   Final    TOO YOUNG TO READ Performed at [facility]    Report Status PENDING  Incomplete  MRSA PCR Screening     Status: None   Collection Time: 09/19/16  2:46 AM  Result Value Ref Range Status   MRSA by PCR NEGATIVE NEGATIVE Final    Comment:        The GeneXpert MRSA Assay (FDA approved for NASAL  specimens only), is one component of a comprehensive MRSA colonization surveillance program. It is not intended to diagnose MRSA infection nor to guide or monitor treatment for MRSA infections.    Medical History: Past Medical History:  Diagnosis Date  . CVA (cerebral vascular accident) ([place]) 2015  . Heart murmur   . Hypertension    Antibiotics Given (last 72 hours)    Date/Time Action Medication Dose Rate   09/18/16 2105 Given   vancomycin (VANCOCIN) IVPB 1000 mg/200 mL premix 1,000 mg 200 mL/hr   09/19/16 1131 Given   vancomycin (VANCOCIN) IVPB 1000 mg/200 mL premix 1,000 mg 200 mL/hr   09/19/16 2137 Given   vancomycin (VANCOCIN) IVPB 1000 mg/200 mL premix 1,000 mg 200 mL/hr   09/20/16 1051 Given   vancomycin (VANCOCIN) IVPB 1000 mg/200 mL premix 1,000 mg 200 mL/hr   09/20/16 2217 Given   vancomycin  (VANCOCIN) IVPB 1000 mg/200 mL premix 1,000 mg 200 mL/hr     Assessment: 51 yo female s/p recent hospital discharge 12/28; Pt was diagnosed and treated for psoriasis and scabies; now returns with worsening rash. Pt has elevated WBCs, lactic acid, and subjective fever/chills on admission. 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 Skin biopsy pending. Vancomycin trough 9mg /dl is below goal, adjust dose.   Microbiology results:   12/31 BCx: ngtd  12/31UCx:  multiple species, recollect if warranted  1/2MRSA PCR: negative  Goal of Therapy:  Vancomycin troughs 15-20 mcg/ml  Plan:  Increase Vancomycin 1500mg  IV q12h F/U troughs at steady state Monitor labs, renal fxn, progress and c/s Narrow ABX when improved / appropriate.    Isac Sarna, BS [place], BCPS Clinical Pharmacist Pager [phone]  09/21/2016,10:06 AM

## 2016-09-22 LAB — CULTURE, BLOOD (ROUTINE X 2)
CULTURE: NO GROWTH
CULTURE: NO GROWTH

## 2016-09-22 LAB — LACTIC ACID, PLASMA: Lactic Acid, Venous: 2.1 mmol/L (ref 0.5–1.9)

## 2016-09-22 MED ORDER — PREDNISONE 20 MG PO TABS
40.0000 mg | ORAL_TABLET | Freq: Every day | ORAL | Status: DC
  Administered 2016-09-23: 40 mg via ORAL
  Filled 2016-09-22: qty 2

## 2016-09-22 MED ORDER — CLOBETASOL PROPIONATE 0.05 % EX OINT
TOPICAL_OINTMENT | Freq: Two times a day (BID) | CUTANEOUS | Status: DC
  Administered 2016-09-23 – 2016-09-26 (×5): via TOPICAL
  Filled 2016-09-22 (×5): qty 15

## 2016-09-22 MED ORDER — FUROSEMIDE 10 MG/ML IJ SOLN
40.0000 mg | Freq: Once | INTRAMUSCULAR | Status: AC
  Administered 2016-09-22: 40 mg via INTRAVENOUS
  Filled 2016-09-22: qty 4

## 2016-09-22 NOTE — Progress Notes (Signed)
CRITICAL VALUE ALERT  Critical value received:  Lactic Acid 2.1  Date of notification:  09/22/2016  Time of notification:  0737  Critical value read back:Yes.    Nurse who received alert:  Radene Gunning  MD notified (1st page):  Dr Karleen Hampshire  Time of first page:  [phone]  MD notified (2nd page):  Time of second page:  Responding MD:  Awaiting response  Time MD responded:  Awaiting response

## 2016-09-22 NOTE — Progress Notes (Signed)
PROGRESS NOTE                                                                                                                                                                                                             Patient Demographics:    Meghan Simmons, is a 51 y.o. female, DOB - 26-Nov-1965, [id number]  Admit date - 09/17/2016   Admitting Physician [name], MD  Outpatient Primary MD for the patient is [name], NP  LOS - 5  Chief Complaint  Patient presents with  . Rash       Brief Narrative - this  is a 51 y.o. female with medical history significant of HTN and depression with recent treatment for persistent and worsening rash.  Patient started with a rash in November. [facility] and was treated for bed bugs then scabies without any benefit, her rash continued unabated.  Her rash is now confluent all over her body Her face is somewhat spared, it is itchy, dry scaly and exfoliative.   Subjective:    Dolphine Skalicky the rash is better, itching is better. But reports worsening pedal edema.    Assessment  & Plan :     1.Generalized dry scaly, itchy, exfoliative rash with signs of dehydration and hypotension. Suspect super infection on top of rash.  Skin biopsy shows dermal  hypersensitivity reaction. Stopped IV fluids as her pedal edema is getting worse.  One dose of IV lasix, prednisone taper starting from tomorrow.  Apply clobestol over the rash, improving.  Pt reports she might have developed this rash after starting a medication given by the health department. She does not remember the name of the medication at this time.    2. Depression. On Celexa continue.  3. Hypertension. Well controlled. No new changes in meds.   4. Normocytic anemia: stable around 10.   5. Leukocytosis: suspect from steroids. Repeat labs in am.     Family Communication  :  None  Code  Status :  Full  Diet : Diet regular Room service appropriate? Yes; Fluid consistency: Thin   Disposition Plan  :  Stay inpt  Consults  :  None  Procedures  :  Skin Biopsy in the ER  DVT Prophylaxis  :  Lovenox   Lab Results  Component Value Date  PLT 423 (H) 09/20/2016    Inpatient Medications  Scheduled Meds: . bacitracin   Topical BID  . citalopram  10 mg Oral Daily  . clobetasol ointment   Topical BID  . enoxaparin (LOVENOX) injection  40 mg Subcutaneous Q24H  . furosemide  40 mg Intravenous Once  . nystatin   Topical TID  . [START ON 09/23/2016] predniSONE  40 mg Oral QAC breakfast   Continuous Infusions:  PRN Meds:.acetaminophen **OR** acetaminophen, hydrOXYzine, morphine injection, ondansetron **OR** ondansetron (ZOFRAN) IV  Antibiotics  :    Anti-infectives    Start     Dose/Rate Route Frequency Ordered Stop   09/21/16 1030  vancomycin (VANCOCIN) 1,500 mg in sodium chloride 0.9 % 500 mL IVPB  Status:  Discontinued     1,500 mg 250 mL/hr over 120 Minutes Intravenous Every 12 hours 09/21/16 1018 09/22/16 1646   09/18/16 1000  vancomycin (VANCOCIN) IVPB 1000 mg/200 mL premix  Status:  Discontinued     1,000 mg 200 mL/hr over 60 Minutes Intravenous Every 12 hours 09/18/16 0740 09/21/16 1018   09/18/16 0145  vancomycin (VANCOCIN) IVPB 1000 mg/200 mL premix     1,000 mg 200 mL/hr over 60 Minutes Intravenous  Once 09/18/16 0141 09/18/16 0323         Objective:   Vitals:   09/21/16 2111 09/21/16 2236 09/22/16 0659 09/22/16 1606  BP:  (!) 142/91 137/84 (!) 153/98  Pulse:  70 64 64  Resp:  18 18 20   Temp:  97.8 F (36.6 C) 97.5 F (36.4 C) 97.8 F (36.6 C)  TempSrc:  Oral Oral Oral  SpO2: 97% 97% 98% 99%  Weight:      Height:        Wt Readings from Last 3 Encounters:  09/18/16 97.3 kg (214 lb 9.6 oz)  09/13/16 91.8 kg (202 lb 4.8 oz)  09/12/16 90.7 kg (200 lb)     Intake/Output Summary (Last 24 hours) at 09/22/16 1647 Last data filed at 09/22/16  1500  Gross per 24 hour  Intake              480 ml  Output                0 ml  Net              480 ml     Physical Exam  Awake Alert, Oriented X 3, No new F.N deficits, Normal affect [place].AT,PERRAL Supple Neck,No JVD, No cervical lymphadenopathy appriciated.  Symmetrical Chest wall movement, Good air movement bilaterally, CTAB RRR,No Gallops,Rubs or new Murmurs, No Parasternal Heave +ve B.Sounds, Abd Soft, No tenderness, No organomegaly appriciated, No rebound - guarding or rigidity. No Cyanosis, Clubbing or edema,  Generalized dry scaly exfoliative rash all over except face, improving.       Data Review:    CBC  Recent Labs Lab 09/17/16 1815 09/18/16 0631 09/19/16 0621 09/20/16 0512  WBC 19.4* 15.7* 16.3* 18.9*  HGB 12.4 10.9* 9.5* 10.0*  HCT 37.5 32.3* 28.8* 30.5*  PLT 432* 338 365 423*  MCV 90.8 90.7 90.9 92.4  MCH 30.0 30.6 30.0 30.3  MCHC 33.1 33.7 33.0 32.8  RDW 13.0 12.9 13.0 13.4  LYMPHSABS 2.2  --   --   --   MONOABS 0.8  --   --   --   EOSABS 0.2  --   --   --   BASOSABS 0.0  --   --   --  Chemistries   Recent Labs Lab 09/17/16 1815 09/18/16 0631 09/19/16 0621 09/20/16 0512  NA 132* 134* 137 139  K 3.5 4.1 3.9 4.2  CL 100* 104 108 109  CO2 24 24 25 24   GLUCOSE 123* 170* 144* 130*  BUN 9 11 13 15   CREATININE 0.84 0.66 0.63 0.81  CALCIUM 7.8* 7.5* 8.0* 8.3*  AST 23  --   --   --   ALT 13*  --   --   --   ALKPHOS 68  --   --   --   BILITOT 0.5  --   --   --    ------------------------------------------------------------------------------------------------------------------ No results for input(s): CHOL, HDL, LDLCALC, TRIG, CHOLHDL, LDLDIRECT in the last 72 hours.  No results found for: HGBA1C ------------------------------------------------------------------------------------------------------------------ No results for input(s): TSH, T4TOTAL, T3FREE, THYROIDAB in the last 72 hours.  Invalid input(s):  FREET3 ------------------------------------------------------------------------------------------------------------------ No results for input(s): VITAMINB12, FOLATE, FERRITIN, TIBC, IRON, RETICCTPCT in the last 72 hours.  Coagulation profile  Recent Labs Lab 09/17/16 2049  INR 1.13    No results for input(s): DDIMER in the last 72 hours.  Cardiac Enzymes No results for input(s): CKMB, TROPONINI, MYOGLOBIN in the last 168 hours.  Invalid input(s): CK ------------------------------------------------------------------------------------------------------------------ No results found for: BNP  Micro Results Recent Results (from the past 240 hour(s))  Blood culture (routine x 2)     Status: None   Collection Time: 09/13/16  8:58 AM  Result Value Ref Range Status   Specimen Description BLOOD RIGHT ANTECUBITAL  Final   Special Requests BOTTLES DRAWN AEROBIC AND ANAEROBIC 6CCEACH  Final   Culture NO GROWTH 6 DAYS  Final   Report Status 09/19/2016 FINAL  Final  Blood culture (routine x 2)     Status: None   Collection Time: 09/13/16  9:09 AM  Result Value Ref Range Status   Specimen Description BLOOD LEFT ANTECUBITAL  Final   Special Requests BOTTLES DRAWN AEROBIC AND ANAEROBIC 10 CC EACH  Final   Culture NO GROWTH 6 DAYS  Final   Report Status 09/19/2016 FINAL  Final  Culture, blood (Routine X 2) w Reflex to ID Panel     Status: None   Collection Time: 09/17/16  8:28 PM  Result Value Ref Range Status   Specimen Description BLOOD LEFT ARM  Final   Special Requests BOTTLES DRAWN AEROBIC AND ANAEROBIC [place]  Final   Culture NO GROWTH 5 DAYS  Final   Report Status 09/22/2016 FINAL  Final  Culture, blood (Routine X 2) w Reflex to ID Panel     Status: None   Collection Time: 09/17/16  8:49 PM  Result Value Ref Range Status   Specimen Description BLOOD LEFT FOREARM  Final   Special Requests BOTTLES DRAWN AEROBIC AND ANAEROBIC [place]  Final   Culture NO GROWTH 5 DAYS  Final   Report  Status 09/22/2016 FINAL  Final  Culture, Urine     Status: Abnormal   Collection Time: 09/17/16 10:19 PM  Result Value Ref Range Status   Specimen Description URINE, CLEAN CATCH  Final   Special Requests NONE  Final   Culture MULTIPLE SPECIES PRESENT, SUGGEST RECOLLECTION (A)  Final   Report Status 09/19/2016 FINAL  Final  Culture, fungus without smear     Status: None (Preliminary result)   Collection Time: 09/18/16  1:17 AM  Result Value Ref Range Status   Specimen Description SCRAPING SKIN  Final   Special Requests Normal  Final   Culture  Final    NO FUNGUS ISOLATED AFTER 2 DAYS Performed at [facility]    Report Status PENDING  Incomplete  Aerobic Culture (superficial specimen)     Status: None (Preliminary result)   Collection Time: 09/18/16  1:17 AM  Result Value Ref Range Status   Specimen Description SCRAPING SKIN  Final   Special Requests Normal  Final   Gram Stain   Final    NO WBC SEEN RARE SQUAMOUS EPITHELIAL CELLS PRESENT RARE GRAM POSITIVE COCCI    Culture   Final    CULTURE REINCUBATED FOR BETTER GROWTH Performed at [facility]    Report Status PENDING  Incomplete  MRSA PCR Screening     Status: None   Collection Time: 09/19/16  2:46 AM  Result Value Ref Range Status   MRSA by PCR NEGATIVE NEGATIVE Final    Comment:        The GeneXpert MRSA Assay (FDA approved for NASAL specimens only), is one component of a comprehensive MRSA colonization surveillance program. It is not intended to diagnose MRSA infection nor to guide or monitor treatment for MRSA infections.     Radiology Reports Dg Chest Port 1 View  Result Date: 09/13/2016 CLINICAL DATA:  Dyspnea. EXAM: PORTABLE CHEST 1 VIEW COMPARISON:  Chest CT dated 02/16/2015 FINDINGS: Chronic prominence of the main pulmonary arteries. Overall heart size and pulmonary vascularity are normal. No infiltrates or effusions. No bone abnormality. IMPRESSION: No acute abnormality. Chronic  prominence of the main pulmonary arteries. Electronically Signed   By: Lorriane Shire M.D.   On: 09/13/2016 09:44    Time Spent in minutes  30 minutes.    Loghan Kurtzman M.D on 09/22/2016 at 4:47 PM  Between 7am to 7pm - Pager - [phone]  After 7pm go to [email or web address] - password [facility]  [facility] -  Office  [phone]

## 2016-09-23 LAB — BASIC METABOLIC PANEL
Anion gap: 7 (ref 5–15)
BUN: 20 mg/dL (ref 6–20)
CALCIUM: 8.4 mg/dL — AB (ref 8.9–10.3)
CHLORIDE: 106 mmol/L (ref 101–111)
CO2: 26 mmol/L (ref 22–32)
CREATININE: 0.83 mg/dL (ref 0.44–1.00)
Glucose, Bld: 90 mg/dL (ref 65–99)
Potassium: 4 mmol/L (ref 3.5–5.1)
SODIUM: 139 mmol/L (ref 135–145)

## 2016-09-23 LAB — CBC
HCT: 35 % — ABNORMAL LOW (ref 36.0–46.0)
Hemoglobin: 11.4 g/dL — ABNORMAL LOW (ref 12.0–15.0)
MCH: 30.6 pg (ref 26.0–34.0)
MCHC: 32.6 g/dL (ref 30.0–36.0)
MCV: 93.8 fL (ref 78.0–100.0)
Platelets: 414 10*3/uL — ABNORMAL HIGH (ref 150–400)
RBC: 3.73 MIL/uL — ABNORMAL LOW (ref 3.87–5.11)
RDW: 13.8 % (ref 11.5–15.5)
WBC: 19.9 10*3/uL — AB (ref 4.0–10.5)

## 2016-09-23 NOTE — Progress Notes (Signed)
PROGRESS NOTE                                                                                                                                                                                                             Patient Demographics:    Meghan Simmons, is a 51 y.o. female, DOB - 02-17-1966, [id number]  Admit date - 09/17/2016   Admitting Physician [name], MD  Outpatient Primary MD for the patient is [name], NP  LOS - 6  Chief Complaint  Patient presents with  . Rash       Brief Narrative - this  is a 51 y.o. female with medical history significant of HTN and depression with recent treatment for persistent and worsening rash.  Patient started with a rash in November. [facility] and was treated for bed bugs then scabies without any benefit, her rash continued unabated.  Her rash is now confluent all over her body Her face is somewhat spared, it is itchy, dry scaly and exfoliative.   Subjective:    Meghan Simmons the rash is better, itching is better. But reports worsening pedal edema.  Stopped iV fluids. Pedal edema is getting better.   Assessment  & Plan :     1.Generalized dry scaly, itchy, exfoliative rash with signs of dehydration and hypotension. Suspect super infection on top of rash. Completed 5 days of IV vancomycin.  Skin biopsy shows dermal  hypersensitivity reaction. Stopped IV fluids as her pedal edema is getting worse.  One dose of IV lasix given on 1/5, followed by another dose of lasix in am.  prednisone taper over the period of next 4 to 6 weeks.  Apply clobestol over the rash, improving.  Pt reports she might have developed this rash after starting a medication given by the health department. She does not remember the name of the medication at this time.  Unclear if its due to the medication from scabies.    2. Depression. On Celexa continue.  3.  Hypertension. Well controlled. No new changes in meds.   4. Normocytic anemia: stable around 10.   5. Leukocytosis: suspect from steroids. Repeat labs in am show persistent leukocytosis. .     Family Communication  :  None  Code Status :  Full  Diet : Diet regular Room service appropriate?  Yes; Fluid consistency: Thin   Disposition Plan  :  Stay inpt  Consults  :  None  Procedures  :  Skin Biopsy in the ER  DVT Prophylaxis  :  Lovenox   Lab Results  Component Value Date   PLT 414 (H) 09/23/2016    Inpatient Medications  Scheduled Meds: . bacitracin   Topical BID  . citalopram  10 mg Oral Daily  . clobetasol ointment   Topical BID  . enoxaparin (LOVENOX) injection  40 mg Subcutaneous Q24H  . nystatin   Topical TID  . predniSONE  40 mg Oral QAC breakfast   Continuous Infusions:  PRN Meds:.acetaminophen **OR** acetaminophen, hydrOXYzine, morphine injection, ondansetron **OR** ondansetron (ZOFRAN) IV  Antibiotics  :    Anti-infectives    Start     Dose/Rate Route Frequency Ordered Stop   09/21/16 1030  vancomycin (VANCOCIN) 1,500 mg in sodium chloride 0.9 % 500 mL IVPB  Status:  Discontinued     1,500 mg 250 mL/hr over 120 Minutes Intravenous Every 12 hours 09/21/16 1018 09/22/16 1646   09/18/16 1000  vancomycin (VANCOCIN) IVPB 1000 mg/200 mL premix  Status:  Discontinued     1,000 mg 200 mL/hr over 60 Minutes Intravenous Every 12 hours 09/18/16 0740 09/21/16 1018   09/18/16 0145  vancomycin (VANCOCIN) IVPB 1000 mg/200 mL premix     1,000 mg 200 mL/hr over 60 Minutes Intravenous  Once 09/18/16 0141 09/18/16 0323         Objective:   Vitals:   09/22/16 2006 09/22/16 2123 09/23/16 0622 09/23/16 1504  BP:  132/77 (!) 138/91 138/89  Pulse:  66 (!) 59 69  Resp:  20 20 20   Temp:  98.1 F (36.7 C) 98.2 F (36.8 C) 98.2 F (36.8 C)  TempSrc:  Oral Oral Oral  SpO2: 100% 99% 99% 99%  Weight:      Height:        Wt Readings from Last 3 Encounters:    09/18/16 97.3 kg (214 lb 9.6 oz)  09/13/16 91.8 kg (202 lb 4.8 oz)  09/12/16 90.7 kg (200 lb)     Intake/Output Summary (Last 24 hours) at 09/23/16 1914 Last data filed at 09/23/16 1740  Gross per 24 hour  Intake              720 ml  Output                0 ml  Net              720 ml     Physical Exam  Awake Alert, Oriented X 3, No new F.N deficits, Normal affect [place].AT,PERRAL Supple Neck,No JVD, No cervical lymphadenopathy appriciated.  Symmetrical Chest wall movement, Good air movement bilaterally, CTAB RRR,No Gallops,Rubs or new Murmurs, No Parasternal Heave +ve B.Sounds, Abd Soft, No tenderness, No organomegaly appriciated, No rebound - guarding or rigidity. No Cyanosis, Clubbing or edema,  Generalized dry scaly exfoliative rash all over except face, improving.       Data Review:    CBC  Recent Labs Lab 09/17/16 1815 09/18/16 0631 09/19/16 0621 09/20/16 0512 09/23/16 0710  WBC 19.4* 15.7* 16.3* 18.9* 19.9*  HGB 12.4 10.9* 9.5* 10.0* 11.4*  HCT 37.5 32.3* 28.8* 30.5* 35.0*  PLT 432* 338 365 423* 414*  MCV 90.8 90.7 90.9 92.4 93.8  MCH 30.0 30.6 30.0 30.3 30.6  MCHC 33.1 33.7 33.0 32.8 32.6  RDW 13.0 12.9 13.0 13.4 13.8  LYMPHSABS 2.2  --   --   --   --  MONOABS 0.8  --   --   --   --   EOSABS 0.2  --   --   --   --   BASOSABS 0.0  --   --   --   --     Chemistries   Recent Labs Lab 09/17/16 1815 09/18/16 0631 09/19/16 0621 09/20/16 0512 09/23/16 0710  NA 132* 134* 137 139 139  K 3.5 4.1 3.9 4.2 4.0  CL 100* 104 108 109 106  CO2 24 24 25 24 26   GLUCOSE 123* 170* 144* 130* 90  BUN 9 11 13 15 20   CREATININE 0.84 0.66 0.63 0.81 0.83  CALCIUM 7.8* 7.5* 8.0* 8.3* 8.4*  AST 23  --   --   --   --   ALT 13*  --   --   --   --   ALKPHOS 68  --   --   --   --   BILITOT 0.5  --   --   --   --    ------------------------------------------------------------------------------------------------------------------ No results for input(s): CHOL, HDL,  LDLCALC, TRIG, CHOLHDL, LDLDIRECT in the last 72 hours.  No results found for: HGBA1C ------------------------------------------------------------------------------------------------------------------ No results for input(s): TSH, T4TOTAL, T3FREE, THYROIDAB in the last 72 hours.  Invalid input(s): FREET3 ------------------------------------------------------------------------------------------------------------------ No results for input(s): VITAMINB12, FOLATE, FERRITIN, TIBC, IRON, RETICCTPCT in the last 72 hours.  Coagulation profile  Recent Labs Lab 09/17/16 2049  INR 1.13    No results for input(s): DDIMER in the last 72 hours.  Cardiac Enzymes No results for input(s): CKMB, TROPONINI, MYOGLOBIN in the last 168 hours.  Invalid input(s): CK ------------------------------------------------------------------------------------------------------------------ No results found for: BNP  Micro Results Recent Results (from the past 240 hour(s))  Culture, blood (Routine X 2) w Reflex to ID Panel     Status: None   Collection Time: 09/17/16  8:28 PM  Result Value Ref Range Status   Specimen Description BLOOD LEFT ARM  Final   Special Requests BOTTLES DRAWN AEROBIC AND ANAEROBIC [place]  Final   Culture NO GROWTH 5 DAYS  Final   Report Status 09/22/2016 FINAL  Final  Culture, blood (Routine X 2) w Reflex to ID Panel     Status: None   Collection Time: 09/17/16  8:49 PM  Result Value Ref Range Status   Specimen Description BLOOD LEFT FOREARM  Final   Special Requests BOTTLES DRAWN AEROBIC AND ANAEROBIC [place]  Final   Culture NO GROWTH 5 DAYS  Final   Report Status 09/22/2016 FINAL  Final  Culture, Urine     Status: Abnormal   Collection Time: 09/17/16 10:19 PM  Result Value Ref Range Status   Specimen Description URINE, CLEAN CATCH  Final   Special Requests NONE  Final   Culture MULTIPLE SPECIES PRESENT, SUGGEST RECOLLECTION (A)  Final   Report Status 09/19/2016 FINAL  Final    Culture, fungus without smear     Status: None (Preliminary result)   Collection Time: 09/18/16  1:17 AM  Result Value Ref Range Status   Specimen Description SCRAPING SKIN  Final   Special Requests Normal  Final   Culture   Final    NO FUNGUS ISOLATED AFTER 3 DAYS Performed at [facility]    Report Status PENDING  Incomplete  Aerobic Culture (superficial specimen)     Status: None (Preliminary result)   Collection Time: 09/18/16  1:17 AM  Result Value Ref Range Status   Specimen Description SCRAPING SKIN  Final  Special Requests Normal  Final   Gram Stain   Final    NO WBC SEEN RARE SQUAMOUS EPITHELIAL CELLS PRESENT RARE GRAM POSITIVE COCCI    Culture   Final    RARE STAPHYLOCOCCUS AUREUS SUSCEPTIBILITIES TO FOLLOW Performed at [facility]    Report Status PENDING  Incomplete  MRSA PCR Screening     Status: None   Collection Time: 09/19/16  2:46 AM  Result Value Ref Range Status   MRSA by PCR NEGATIVE NEGATIVE Final    Comment:        The GeneXpert MRSA Assay (FDA approved for NASAL specimens only), is one component of a comprehensive MRSA colonization surveillance program. It is not intended to diagnose MRSA infection nor to guide or monitor treatment for MRSA infections.     Radiology Reports Dg Chest Port 1 View  Result Date: 09/13/2016 CLINICAL DATA:  Dyspnea. EXAM: PORTABLE CHEST 1 VIEW COMPARISON:  Chest CT dated 02/16/2015 FINDINGS: Chronic prominence of the main pulmonary arteries. Overall heart size and pulmonary vascularity are normal. No infiltrates or effusions. No bone abnormality. IMPRESSION: No acute abnormality. Chronic prominence of the main pulmonary arteries. Electronically Signed   By: Lorriane Shire M.D.   On: 09/13/2016 09:44    Time Spent in minutes  30 minutes.    Maclovia Uher M.D on 09/23/2016 at 7:14 PM  Between 7am to 7pm - Pager - [phone]  After 7pm go to [email or web address] - password [facility]  [facility] -   Office  [phone]

## 2016-09-24 LAB — CREATININE, SERUM
Creatinine, Ser: 0.75 mg/dL (ref 0.44–1.00)
GFR calc non Af Amer: >60 mL/min (ref >60)

## 2016-09-24 LAB — AEROBIC CULTURE W GRAM STAIN (SUPERFICIAL SPECIMEN)
Gram Stain: NONE SEEN
Special Requests: NORMAL

## 2016-09-24 LAB — AEROBIC CULTURE  (SUPERFICIAL SPECIMEN)

## 2016-09-24 MED ORDER — FUROSEMIDE 10 MG/ML IJ SOLN
40.0000 mg | Freq: Once | INTRAMUSCULAR | Status: AC
Start: 2016-09-24 — End: 2016-09-24
  Administered 2016-09-24: 40 mg via INTRAVENOUS
  Filled 2016-09-24: qty 4

## 2016-09-24 MED ORDER — CLOBETASOL PROPIONATE 0.05 % EX OINT: TOPICAL_OINTMENT | Freq: Two times a day (BID) | CUTANEOUS | 0 refills | Status: DC

## 2016-09-24 MED ORDER — VANCOMYCIN HCL 10 G IV SOLR
1500.0000 mg | Freq: Two times a day (BID) | INTRAVENOUS | Status: DC
  Administered 2016-09-24 – 2016-09-26 (×4): 1500 mg via INTRAVENOUS
  Filled 2016-09-24 (×7): qty 1500

## 2016-09-24 MED ORDER — BACITRACIN ZINC 500 UNIT/GM EX OINT: TOPICAL_OINTMENT | Freq: Two times a day (BID) | CUTANEOUS | 0 refills | Status: DC

## 2016-09-24 MED ORDER — PREDNISONE 20 MG PO TABS: ORAL_TABLET | ORAL | 0 refills | Status: DC

## 2016-09-24 MED ORDER — METHYLPREDNISOLONE SODIUM SUCC 125 MG IJ SOLR
60.0000 mg | Freq: Two times a day (BID) | INTRAMUSCULAR | Status: DC
  Administered 2016-09-24: 60 mg via INTRAVENOUS
  Administered 2016-09-24: 125 mg via INTRAVENOUS
  Administered 2016-09-25 – 2016-09-26 (×3): 60 mg via INTRAVENOUS
  Filled 2016-09-24 (×5): qty 2

## 2016-09-24 NOTE — Progress Notes (Signed)
PROGRESS NOTE                                                                                                                                                                                                             Patient Demographics:    Meghan Simmons, is a 51 y.o. female, DOB - November 13, 1965, [id number]  Admit date - 09/17/2016   Admitting Physician Meghan Bongo, MD  Outpatient Primary MD for the patient is Meghan Ganja, NP  LOS - 7  Chief Complaint  Patient presents with  . Rash       Brief Narrative - this  is a 51 y.o. female with medical history significant of HTN and depression with recent treatment for persistent and worsening rash.  Patient started with a rash in November. [facility] and [facility] treated for bed bugs then scabies without any benefit, her rash continued unabated.  Her rash is now confluent all over her body Her face is somewhat spared, it is itchy, dry scaly and exfoliative.   Subjective:    Meghan Simmons the rash is better, itching is better. But reports worsening pedal edema.  Stopped iV fluids. Pedal edema is getting better.   Assessment  & Plan :     1.Generalized dry scaly, itchy, exfoliative rash with signs of dehydration and hypotension. Suspect super infection on top of rash. She completed 5 days of IV vancomycin, stopped it for one day, but culture on 1/7 showed MRSA infection, vancomycin restarted.  Skin biopsy shows dermal  hypersensitivity reaction. As she was improving , we have transitioned to po prednisone,  Stopped IV fluids as her pedal edema is getting worse.  Lasix prn for pedal edema   today she developed new erythematous rash on the chin and upper lip , its itchy, I had to put her back on IV solumedrol .  Monitor.    2. Depression. On Celexa continue.  3. Hypertension. Well controlled. No new changes in meds.   4. Normocytic anemia:  stable around 10.   5. Leukocytosis: suspect from steroids. Repeat labs in am show persistent leukocytosis. .     Family Communication  :  None  Code Status :  Full  Diet : Diet regular Room service appropriate? Yes; Fluid consistency: Thin   Disposition Plan  :  Stay inpt  Consults  :  None  Procedures  :  Skin Biopsy in the ER  DVT Prophylaxis  :  Lovenox   Lab Results  Component Value Date   PLT 414 (H) 09/23/2016    Inpatient Medications  Scheduled Meds: . bacitracin   Topical BID  . citalopram  10 mg Oral Daily  . clobetasol ointment   Topical BID  . enoxaparin (LOVENOX) injection  40 mg Subcutaneous Q24H  . methylPREDNISolone (SOLU-MEDROL) injection  60 mg Intravenous Q12H  . nystatin   Topical TID   Continuous Infusions:  PRN Meds:.acetaminophen **OR** acetaminophen, hydrOXYzine, morphine injection, ondansetron **OR** ondansetron (ZOFRAN) IV  Antibiotics  :    Anti-infectives    Start     Dose/Rate Route Frequency Ordered Stop   09/21/16 1030  vancomycin (VANCOCIN) 1,500 mg in sodium chloride 0.9 % 500 mL IVPB  Status:  Discontinued     1,500 mg 250 mL/hr over 120 Minutes Intravenous Every 12 hours 09/21/16 1018 09/22/16 1646   09/18/16 1000  vancomycin (VANCOCIN) IVPB 1000 mg/200 mL premix  Status:  Discontinued     1,000 mg 200 mL/hr over 60 Minutes Intravenous Every 12 hours 09/18/16 0740 09/21/16 1018   09/18/16 0145  vancomycin (VANCOCIN) IVPB 1000 mg/200 mL premix     1,000 mg 200 mL/hr over 60 Minutes Intravenous  Once 09/18/16 0141 09/18/16 0323         Objective:   Vitals:   09/23/16 0622 09/23/16 1504 09/23/16 2211 09/24/16 0609  BP: (!) 138/91 138/89 133/74 (!) 148/91  Pulse: (!) 59 69 77 65  Resp: 20 20 20 20   Temp: 98.2 F (36.8 C) 98.2 F (36.8 C) 98 F (36.7 C) 98 F (36.7 C)  TempSrc: Oral Oral Oral Oral  SpO2: 99% 99% 99% 97%  Weight:      Height:        Wt Readings from Last 3 Encounters:  09/18/16 97.3 kg (214 lb 9.6  oz)  09/13/16 91.8 kg (202 lb 4.8 oz)  09/12/16 90.7 kg (200 lb)     Intake/Output Summary (Last 24 hours) at 09/24/16 1406 Last data filed at 09/24/16 0916  Gross per 24 hour  Intake              720 ml  Output                0 ml  Net              720 ml     Physical Exam  Awake Alert, Oriented X 3, Symmetrical Chest wall movement, Good air movement bilaterally, CTAB RRR,No Gallops,Rubs or new Murmurs, No Parasternal Heave +ve B.Sounds, Abd Soft, No tenderness, No organomegaly appriciated, No rebound - guarding or rigidity. No Cyanosis, Clubbing . Bilateral pedal edema.  Generalized dry scaly exfoliative rash improving, but new erythematous rash over the chin.       Data Review:    CBC  Recent Labs Lab 09/17/16 1815 09/18/16 0631 09/19/16 0621 09/20/16 0512 09/23/16 0710  WBC 19.4* 15.7* 16.3* 18.9* 19.9*  HGB 12.4 10.9* 9.5* 10.0* 11.4*  HCT 37.5 32.3* 28.8* 30.5* 35.0*  PLT 432* 338 365 423* 414*  MCV 90.8 90.7 90.9 92.4 93.8  MCH 30.0 30.6 30.0 30.3 30.6  MCHC 33.1 33.7 33.0 32.8 32.6  RDW 13.0 12.9 13.0 13.4 13.8  LYMPHSABS 2.2  --   --   --   --   MONOABS 0.8  --   --   --   --  EOSABS 0.2  --   --   --   --   BASOSABS 0.0  --   --   --   --     Chemistries   Recent Labs Lab 09/17/16 1815 09/18/16 0631 09/19/16 0621 09/20/16 0512 09/23/16 0710 09/24/16 0659  NA 132* 134* 137 139 139  --   K 3.5 4.1 3.9 4.2 4.0  --   CL 100* 104 108 109 106  --   CO2 24 24 25 24 26   --   GLUCOSE 123* 170* 144* 130* 90  --   BUN 9 11 13 15 20   --   CREATININE 0.84 0.66 0.63 0.81 0.83 0.75  CALCIUM 7.8* 7.5* 8.0* 8.3* 8.4*  --   AST 23  --   --   --   --   --   ALT 13*  --   --   --   --   --   ALKPHOS 68  --   --   --   --   --   BILITOT 0.5  --   --   --   --   --    ------------------------------------------------------------------------------------------------------------------ No results for input(s): CHOL, HDL, LDLCALC, TRIG, CHOLHDL, LDLDIRECT  in the last 72 hours.  No results found for: HGBA1C ------------------------------------------------------------------------------------------------------------------ No results for input(s): TSH, T4TOTAL, T3FREE, THYROIDAB in the last 72 hours.  Invalid input(s): FREET3 ------------------------------------------------------------------------------------------------------------------ No results for input(s): VITAMINB12, FOLATE, FERRITIN, TIBC, IRON, RETICCTPCT in the last 72 hours.  Coagulation profile  Recent Labs Lab 09/17/16 2049  INR 1.13    No results for input(s): DDIMER in the last 72 hours.  Cardiac Enzymes No results for input(s): CKMB, TROPONINI, MYOGLOBIN in the last 168 hours.  Invalid input(s): CK ------------------------------------------------------------------------------------------------------------------ No results found for: BNP  Micro Results Recent Results (from the past 240 hour(s))  Culture, blood (Routine X 2) w Reflex to ID Panel     Status: None   Collection Time: 09/17/16  8:28 PM  Result Value Ref Range Status   Specimen Description BLOOD LEFT ARM  Final   Special Requests BOTTLES DRAWN AEROBIC AND ANAEROBIC [place]  Final   Culture NO GROWTH 5 DAYS  Final   Report Status 09/22/2016 FINAL  Final  Culture, blood (Routine X 2) w Reflex to ID Panel     Status: None   Collection Time: 09/17/16  8:49 PM  Result Value Ref Range Status   Specimen Description BLOOD LEFT FOREARM  Final   Special Requests BOTTLES DRAWN AEROBIC AND ANAEROBIC [place]  Final   Culture NO GROWTH 5 DAYS  Final   Report Status 09/22/2016 FINAL  Final  Culture, Urine     Status: Abnormal   Collection Time: 09/17/16 10:19 PM  Result Value Ref Range Status   Specimen Description URINE, CLEAN CATCH  Final   Special Requests NONE  Final   Culture MULTIPLE SPECIES PRESENT, SUGGEST RECOLLECTION (A)  Final   Report Status 09/19/2016 FINAL  Final  Culture, fungus without smear      Status: None (Preliminary result)   Collection Time: 09/18/16  1:17 AM  Result Value Ref Range Status   Specimen Description SCRAPING SKIN  Final   Special Requests Normal  Final   Culture   Final    NO FUNGUS ISOLATED AFTER 3 DAYS Performed at [facility]    Report Status PENDING  Incomplete  Aerobic Culture (superficial specimen)     Status: None   Collection Time:  09/18/16  1:17 AM  Result Value Ref Range Status   Specimen Description SCRAPING SKIN  Final   Special Requests Normal  Final   Gram Stain   Final    NO WBC SEEN RARE SQUAMOUS EPITHELIAL CELLS PRESENT RARE GRAM POSITIVE COCCI Performed at [facility]    Culture RARE METHICILLIN RESISTANT STAPHYLOCOCCUS AUREUS  Final   Report Status 09/24/2016 FINAL  Final   Organism ID, Bacteria METHICILLIN RESISTANT STAPHYLOCOCCUS AUREUS  Final      Susceptibility   Methicillin resistant staphylococcus aureus - MIC*    CIPROFLOXACIN <=0.5 SENSITIVE Sensitive     ERYTHROMYCIN 0.5 SENSITIVE Sensitive     GENTAMICIN <=0.5 SENSITIVE Sensitive     OXACILLIN 0.5 RESISTANT Resistant     TETRACYCLINE <=1 SENSITIVE Sensitive     VANCOMYCIN 1 SENSITIVE Sensitive     TRIMETH/SULFA <=10 SENSITIVE Sensitive     CLINDAMYCIN <=0.25 SENSITIVE Sensitive     RIFAMPIN <=0.5 SENSITIVE Sensitive     Inducible Clindamycin NEGATIVE Sensitive     * RARE METHICILLIN RESISTANT STAPHYLOCOCCUS AUREUS  MRSA PCR Screening     Status: None   Collection Time: 09/19/16  2:46 AM  Result Value Ref Range Status   MRSA by PCR NEGATIVE NEGATIVE Final    Comment:        The GeneXpert MRSA Assay (FDA approved for NASAL specimens only), is one component of a comprehensive MRSA colonization surveillance program. It is not intended to diagnose MRSA infection nor to guide or monitor treatment for MRSA infections.     Radiology Reports Dg Chest Port 1 View  Result Date: 09/13/2016 CLINICAL DATA:  Dyspnea. EXAM: PORTABLE CHEST 1 VIEW  COMPARISON:  Chest CT dated 02/16/2015 FINDINGS: Chronic prominence of the main pulmonary arteries. Overall heart size and pulmonary vascularity are normal. No infiltrates or effusions. No bone abnormality. IMPRESSION: No acute abnormality. Chronic prominence of the main pulmonary arteries. Electronically Signed   By: Lorriane Shire M.D.   On: 09/13/2016 09:44    Time Spent in minutes  30 minutes.    Shayona Hibbitts M.D on 09/24/2016 at 2:06 PM  Between 7am to 7pm - Pager - [phone]  After 7pm go to [email or web address] - password [facility]  [facility] -  Office  [phone]

## 2016-09-24 NOTE — Progress Notes (Signed)
ANTIBIOTIC CONSULT NOTE  Pharmacy Consult for Vancomycin Indication: cellulitis  Allergies  Allergen Reactions  . Erythromycin Nausea And Vomiting and Rash   Patient Measurements: Height: 5\' 5"  (165.1 cm) Weight: 214 lb 9.6 oz (97.3 kg) IBW/kg (Calculated) : 57  Vital Signs: Temp: 98 F (36.7 C) (01/07 0609) Temp Source: Oral (01/07 0609) BP: 148/91 (01/07 0609) Pulse Rate: 65 (01/07 0609)  Labs:  Recent Labs  09/23/16 0710 09/24/16 0659  WBC 19.9*  --   HGB 11.4*  --   PLT 414*  --   CREATININE 0.83 0.75   Estimated Creatinine Clearance: 99.2 mL/min (by C-G formula based on SCr of 0.75 mg/dL).  No results for input(s): VANCOTROUGH, VANCOPEAK, VANCORANDOM, GENTTROUGH, GENTPEAK, GENTRANDOM, TOBRATROUGH, TOBRAPEAK, TOBRARND, AMIKACINPEAK, AMIKACINTROU, AMIKACIN in the last 72 hours.   Microbiology: Recent Results (from the past 720 hour(s))  Blood culture (routine x 2)     Status: None   Collection Time: 09/13/16  8:58 AM  Result Value Ref Range Status   Specimen Description BLOOD RIGHT ANTECUBITAL  Final   Special Requests BOTTLES DRAWN AEROBIC AND ANAEROBIC 6CCEACH  Final   Culture NO GROWTH 6 DAYS  Final   Report Status 09/19/2016 FINAL  Final  Blood culture (routine x 2)     Status: None   Collection Time: 09/13/16  9:09 AM  Result Value Ref Range Status   Specimen Description BLOOD LEFT ANTECUBITAL  Final   Special Requests BOTTLES DRAWN AEROBIC AND ANAEROBIC 10 CC EACH  Final   Culture NO GROWTH 6 DAYS  Final   Report Status 09/19/2016 FINAL  Final  Culture, blood (Routine X 2) w Reflex to ID Panel     Status: None   Collection Time: 09/17/16  8:28 PM  Result Value Ref Range Status   Specimen Description BLOOD LEFT ARM  Final   Special Requests BOTTLES DRAWN AEROBIC AND ANAEROBIC [place]  Final   Culture NO GROWTH 5 DAYS  Final   Report Status 09/22/2016 FINAL  Final  Culture, blood (Routine X 2) w Reflex to ID Panel     Status: None   Collection Time:  09/17/16  8:49 PM  Result Value Ref Range Status   Specimen Description BLOOD LEFT FOREARM  Final   Special Requests BOTTLES DRAWN AEROBIC AND ANAEROBIC [place]  Final   Culture NO GROWTH 5 DAYS  Final   Report Status 09/22/2016 FINAL  Final  Culture, Urine     Status: Abnormal   Collection Time: 09/17/16 10:19 PM  Result Value Ref Range Status   Specimen Description URINE, CLEAN CATCH  Final   Special Requests NONE  Final   Culture MULTIPLE SPECIES PRESENT, SUGGEST RECOLLECTION (A)  Final   Report Status 09/19/2016 FINAL  Final  Culture, fungus without smear     Status: None (Preliminary result)   Collection Time: 09/18/16  1:17 AM  Result Value Ref Range Status   Specimen Description SCRAPING SKIN  Final   Special Requests Normal  Final   Culture   Final    NO FUNGUS ISOLATED AFTER 3 DAYS Performed at [facility]    Report Status PENDING  Incomplete  Aerobic Culture (superficial specimen)     Status: None   Collection Time: 09/18/16  1:17 AM  Result Value Ref Range Status   Specimen Description SCRAPING SKIN  Final   Special Requests Normal  Final   Gram Stain   Final    NO WBC SEEN RARE SQUAMOUS EPITHELIAL CELLS  PRESENT RARE GRAM POSITIVE COCCI Performed at [facility]    Culture RARE METHICILLIN RESISTANT STAPHYLOCOCCUS AUREUS  Final   Report Status 09/24/2016 FINAL  Final   Organism ID, Bacteria METHICILLIN RESISTANT STAPHYLOCOCCUS AUREUS  Final      Susceptibility   Methicillin resistant staphylococcus aureus - MIC*    CIPROFLOXACIN <=0.5 SENSITIVE Sensitive     ERYTHROMYCIN 0.5 SENSITIVE Sensitive     GENTAMICIN <=0.5 SENSITIVE Sensitive     OXACILLIN 0.5 RESISTANT Resistant     TETRACYCLINE <=1 SENSITIVE Sensitive     VANCOMYCIN 1 SENSITIVE Sensitive     TRIMETH/SULFA <=10 SENSITIVE Sensitive     CLINDAMYCIN <=0.25 SENSITIVE Sensitive     RIFAMPIN <=0.5 SENSITIVE Sensitive     Inducible Clindamycin NEGATIVE Sensitive     * RARE METHICILLIN  RESISTANT STAPHYLOCOCCUS AUREUS  MRSA PCR Screening     Status: None   Collection Time: 09/19/16  2:46 AM  Result Value Ref Range Status   MRSA by PCR NEGATIVE NEGATIVE Final    Comment:        The GeneXpert MRSA Assay (FDA approved for NASAL specimens only), is one component of a comprehensive MRSA colonization surveillance program. It is not intended to diagnose MRSA infection nor to guide or monitor treatment for MRSA infections.    Medical History: Past Medical History:  Diagnosis Date  . CVA (cerebral vascular accident) ([place]) 2015  . Heart murmur   . Hypertension    Antibiotics Given (last 72 hours)    Date/Time Action Medication Dose Rate   09/21/16 2217 Given   vancomycin (VANCOCIN) 1,500 mg in sodium chloride 0.9 % 500 mL IVPB 1,500 mg 250 mL/hr     Assessment: 51 yo female to restart vancomycin as skin scraping culture was positive for MRSA.  Goal of Therapy:  Vancomycin troughs 10-15 mcg/ml  Plan:  Resume Vancomycin 1500mg  IV q12h F/U troughs at steady state Monitor labs, renal fxn, progress and c/s  Thanks for allowing pharmacy to be a part of this patient's care.  Excell Seltzer, PharmD Clinical Pharmacist  09/24/2016,2:43 PM

## 2016-09-25 LAB — CBC
HCT: 33.1 % — ABNORMAL LOW (ref 36.0–46.0)
HEMOGLOBIN: 10.9 g/dL — AB (ref 12.0–15.0)
MCH: 30.7 pg (ref 26.0–34.0)
MCHC: 32.9 g/dL (ref 30.0–36.0)
MCV: 93.2 fL (ref 78.0–100.0)
Platelets: 363 10*3/uL (ref 150–400)
RBC: 3.55 MIL/uL — AB (ref 3.87–5.11)
RDW: 14.2 % (ref 11.5–15.5)
WBC: 18 10*3/uL — ABNORMAL HIGH (ref 4.0–10.5)

## 2016-09-25 LAB — LACTIC ACID, PLASMA: LACTIC ACID, VENOUS: 2.8 mmol/L — AB (ref 0.5–1.9)

## 2016-09-25 NOTE — Progress Notes (Addendum)
CRITICAL VALUE ALERT  Critical value received: Lactic 2.8  Date of notification: 09/25/16  Time of notification:  1200  Critical value read back:Yeas  Nurse who received alert: Janan Ridge, RN   MD notified (1st page):  Dr. Karleen Hampshire  Time of first page:  1202  Responding MD: Dr. Karleen Hampshire  Time MD responded: 1209

## 2016-09-25 NOTE — Progress Notes (Signed)
PROGRESS NOTE                                                                                                                                                                                                             Patient Demographics:    Meghan Simmons, is a 51 y.o. female, DOB - Apr 18, 1966, [id number]  Admit date - 09/17/2016   Admitting Physician [name], MD  Outpatient Primary MD for the patient is [name], NP  LOS - 8  Chief Complaint  Patient presents with  . Rash       Brief Narrative - this  is a 51 y.o. female with medical history significant of HTN and depression with recent treatment for persistent and worsening rash.  Patient started with a rash in November. [facility] went [facility] and was treated for bed bugs then scabies without any benefit, her rash continued unabated.  Her rash is now confluent all over her body Her face is somewhat spared, it is itchy, dry scaly and exfoliative.   Subjective:    Meghan Simmons the rash is better, itching is better. But reports worsening pedal edema.  Stopped iV fluids. Pedal edema is getting better.   Assessment  & Plan :     1.Generalized dry scaly, itchy, exfoliative rash with signs of dehydration and hypotension. Suspect super infection on top of rash. She completed 5 days of IV vancomycin, stopped it for one day, but culture on 1/7 showed MRSA infection, vancomycin restarted.  Skin biopsy shows dermal  hypersensitivity reaction. As she was improving , we have transitioned to po prednisone,  Stopped IV fluids as her pedal edema is getting worse.  Lasix prn for pedal edema  On 1/7, she developed new erythematous rash on the chin and upper lip , its itchy, I had to put her back on IV solumedrol . Continue with IV solumedrol for another 24 hours and we will start tapering the steroids to prednisone in am.  Monitor.    2. Depression.  On Celexa continue.  3. Hypertension. Well controlled. No new changes in meds.   4. Normocytic anemia: stable around 10.   5. Leukocytosis: suspect from steroids. Repeat labs in am show persistent leukocytosis. .     Family Communication  :  None  Code Status :  Full  Diet :  Diet regular Room service appropriate? Yes; Fluid consistency: Thin   Disposition Plan  :  Stay inpt  Consults  :  None  Procedures  :  Skin Biopsy in the ER  DVT Prophylaxis  :  Lovenox   Lab Results  Component Value Date   PLT 363 09/25/2016    Inpatient Medications  Scheduled Meds: . bacitracin   Topical BID  . citalopram  10 mg Oral Daily  . clobetasol ointment   Topical BID  . enoxaparin (LOVENOX) injection  40 mg Subcutaneous Q24H  . methylPREDNISolone (SOLU-MEDROL) injection  60 mg Intravenous Q12H  . nystatin   Topical TID  . vancomycin  1,500 mg Intravenous Q12H   Continuous Infusions:  PRN Meds:.acetaminophen **OR** acetaminophen, hydrOXYzine, morphine injection, ondansetron **OR** ondansetron (ZOFRAN) IV  Antibiotics  :    Anti-infectives    Start     Dose/Rate Route Frequency Ordered Stop   09/24/16 1445  vancomycin (VANCOCIN) 1,500 mg in sodium chloride 0.9 % 500 mL IVPB     1,500 mg 250 mL/hr over 120 Minutes Intravenous Every 12 hours 09/24/16 1434     09/21/16 1030  vancomycin (VANCOCIN) 1,500 mg in sodium chloride 0.9 % 500 mL IVPB  Status:  Discontinued     1,500 mg 250 mL/hr over 120 Minutes Intravenous Every 12 hours 09/21/16 1018 09/22/16 1646   09/18/16 1000  vancomycin (VANCOCIN) IVPB 1000 mg/200 mL premix  Status:  Discontinued     1,000 mg 200 mL/hr over 60 Minutes Intravenous Every 12 hours 09/18/16 0740 09/21/16 1018   09/18/16 0145  vancomycin (VANCOCIN) IVPB 1000 mg/200 mL premix     1,000 mg 200 mL/hr over 60 Minutes Intravenous  Once 09/18/16 0141 09/18/16 0323         Objective:   Vitals:   09/24/16 0609 09/24/16 1506 09/25/16 0640 09/25/16 1533    BP: (!) 148/91 133/83 130/75 (!) 142/89  Pulse: 65 68 63 69  Resp: 20 20 15 16   Temp: 98 F (36.7 C) 98 F (36.7 C) 97.5 F (36.4 C) 98.6 F (37 C)  TempSrc: Oral Oral Oral   SpO2: 97% 97% 100% 96%  Weight:      Height:        Wt Readings from Last 3 Encounters:  09/18/16 97.3 kg (214 lb 9.6 oz)  09/13/16 91.8 kg (202 lb 4.8 oz)  09/12/16 90.7 kg (200 lb)     Intake/Output Summary (Last 24 hours) at 09/25/16 1915 Last data filed at 09/25/16 1700  Gross per 24 hour  Intake             1960 ml  Output                0 ml  Net             1960 ml     Physical Exam  Awake Alert, Oriented X 3, Symmetrical Chest wall movement, Good air movement bilaterally, CTAB RRR,No Gallops,Rubs or new Murmurs, No Parasternal Heave +ve B.Sounds, Abd Soft, No tenderness, No organomegaly appriciated, No rebound - guarding or rigidity. No Cyanosis, Clubbing . Bilateral pedal edema.  Generalized dry scaly exfoliative rash improving, but new erythematous rash over the chin.       Data Review:    CBC  Recent Labs Lab 09/19/16 0621 09/20/16 0512 09/23/16 0710 09/25/16 1124  WBC 16.3* 18.9* 19.9* 18.0*  HGB 9.5* 10.0* 11.4* 10.9*  HCT 28.8* 30.5* 35.0* 33.1*  PLT 365 423*  414* 363  MCV 90.9 92.4 93.8 93.2  MCH 30.0 30.3 30.6 30.7  MCHC 33.0 32.8 32.6 32.9  RDW 13.0 13.4 13.8 14.2    Chemistries   Recent Labs Lab 09/19/16 0621 09/20/16 0512 09/23/16 0710 09/24/16 0659  NA 137 139 139  --   K 3.9 4.2 4.0  --   CL 108 109 106  --   CO2 25 24 26   --   GLUCOSE 144* 130* 90  --   BUN 13 15 20   --   CREATININE 0.63 0.81 0.83 0.75  CALCIUM 8.0* 8.3* 8.4*  --    ------------------------------------------------------------------------------------------------------------------ No results for input(s): CHOL, HDL, LDLCALC, TRIG, CHOLHDL, LDLDIRECT in the last 72 hours.  No results found for:  HGBA1C ------------------------------------------------------------------------------------------------------------------ No results for input(s): TSH, T4TOTAL, T3FREE, THYROIDAB in the last 72 hours.  Invalid input(s): FREET3 ------------------------------------------------------------------------------------------------------------------ No results for input(s): VITAMINB12, FOLATE, FERRITIN, TIBC, IRON, RETICCTPCT in the last 72 hours.  Coagulation profile No results for input(s): INR, PROTIME in the last 168 hours.  No results for input(s): DDIMER in the last 72 hours.  Cardiac Enzymes No results for input(s): CKMB, TROPONINI, MYOGLOBIN in the last 168 hours.  Invalid input(s): CK ------------------------------------------------------------------------------------------------------------------ No results found for: BNP  Micro Results Recent Results (from the past 240 hour(s))  Culture, blood (Routine X 2) w Reflex to ID Panel     Status: None   Collection Time: 09/17/16  8:28 PM  Result Value Ref Range Status   Specimen Description BLOOD LEFT ARM  Final   Special Requests BOTTLES DRAWN AEROBIC AND ANAEROBIC [place]  Final   Culture NO GROWTH 5 DAYS  Final   Report Status 09/22/2016 FINAL  Final  Culture, blood (Routine X 2) w Reflex to ID Panel     Status: None   Collection Time: 09/17/16  8:49 PM  Result Value Ref Range Status   Specimen Description BLOOD LEFT FOREARM  Final   Special Requests BOTTLES DRAWN AEROBIC AND ANAEROBIC [place]  Final   Culture NO GROWTH 5 DAYS  Final   Report Status 09/22/2016 FINAL  Final  Culture, Urine     Status: Abnormal   Collection Time: 09/17/16 10:19 PM  Result Value Ref Range Status   Specimen Description URINE, CLEAN CATCH  Final   Special Requests NONE  Final   Culture MULTIPLE SPECIES PRESENT, SUGGEST RECOLLECTION (A)  Final   Report Status 09/19/2016 FINAL  Final  Culture, fungus without smear     Status: None (Preliminary result)    Collection Time: 09/18/16  1:17 AM  Result Value Ref Range Status   Specimen Description SCRAPING SKIN  Final   Special Requests Normal  Final   Culture   Final    NO FUNGUS ISOLATED AFTER 3 DAYS Performed at [facility]    Report Status PENDING  Incomplete  Aerobic Culture (superficial specimen)     Status: None   Collection Time: 09/18/16  1:17 AM  Result Value Ref Range Status   Specimen Description SCRAPING SKIN  Final   Special Requests Normal  Final   Gram Stain   Final    NO WBC SEEN RARE SQUAMOUS EPITHELIAL CELLS PRESENT RARE GRAM POSITIVE COCCI Performed at [place]  Final   Report Status 09/24/2016 FINAL  Final   Organism ID, Bacteria METHICILLIN RESISTANT STAPHYLOCOCCUS AUREUS  Final      Susceptibility   Methicillin resistant staphylococcus aureus -  MIC*    CIPROFLOXACIN <=0.5 SENSITIVE Sensitive     ERYTHROMYCIN 0.5 SENSITIVE Sensitive     GENTAMICIN <=0.5 SENSITIVE Sensitive     OXACILLIN 0.5 RESISTANT Resistant     TETRACYCLINE <=1 SENSITIVE Sensitive     VANCOMYCIN 1 SENSITIVE Sensitive     TRIMETH/SULFA <=10 SENSITIVE Sensitive     CLINDAMYCIN <=0.25 SENSITIVE Sensitive     RIFAMPIN <=0.5 SENSITIVE Sensitive     Inducible Clindamycin NEGATIVE Sensitive     * RARE METHICILLIN RESISTANT STAPHYLOCOCCUS AUREUS  MRSA PCR Screening     Status: None   Collection Time: 09/19/16  2:46 AM  Result Value Ref Range Status   MRSA by PCR NEGATIVE NEGATIVE Final    Comment:        The GeneXpert MRSA Assay (FDA approved for NASAL specimens only), is one component of a comprehensive MRSA colonization surveillance program. It is not intended to diagnose MRSA infection nor to guide or monitor treatment for MRSA infections.     Radiology Reports Dg Chest Port 1 View  Result Date: 09/13/2016 CLINICAL DATA:  Dyspnea. EXAM: PORTABLE CHEST 1 VIEW COMPARISON:  Chest CT dated 02/16/2015  FINDINGS: Chronic prominence of the main pulmonary arteries. Overall heart size and pulmonary vascularity are normal. No infiltrates or effusions. No bone abnormality. IMPRESSION: No acute abnormality. Chronic prominence of the main pulmonary arteries. Electronically Signed   By: Lorriane Shire M.D.   On: 09/13/2016 09:44    Time Spent in minutes  30 minutes.    Barnaby Rippeon M.D on 09/25/2016 at 7:15 PM  Between 7am to 7pm - Pager - [phone]  After 7pm go to [email or web address] - password [facility]  [facility] -  Office  [phone]

## 2016-09-26 MED ORDER — CLOBETASOL PROPIONATE 0.05 % EX OINT: TOPICAL_OINTMENT | Freq: Two times a day (BID) | CUTANEOUS | 0 refills | Status: DC

## 2016-09-26 MED ORDER — PREDNISONE 20 MG PO TABS: ORAL_TABLET | ORAL | 0 refills | Status: DC

## 2016-09-26 MED ORDER — DOXYCYCLINE HYCLATE 50 MG PO CAPS: 100.0000 mg | ORAL_CAPSULE | Freq: Two times a day (BID) | ORAL | 0 refills | Status: DC

## 2016-09-26 MED ORDER — DOXYCYCLINE HYCLATE 100 MG IV SOLR: 100.0000 mg | Freq: Two times a day (BID) | INTRAVENOUS | 0 refills | Status: DC

## 2016-09-26 MED ORDER — MENTHOL 3 MG MT LOZG
1.0000 | LOZENGE | OROMUCOSAL | Status: DC | PRN
  Filled 2016-09-26: qty 9

## 2016-09-26 NOTE — Progress Notes (Signed)
Patient discharged home.  IV removed - WNL.  Reviewed DC instructions and medications.   Emphasized importance of completing doses of abx and steroids.  Verbalizes understanding.  Instructed to follow up with PCP and dermatology in 1 week.  No questions at this time.  Assisted off unit in NAD.

## 2016-09-26 NOTE — Discharge Summary (Signed)
Physician Discharge Summary  Meghan Simmons [id number] DOB: Jan 30, 1966 DOA: 09/17/2016  PCP: [name], NP  Admit date: 09/17/2016 Discharge date: 09/26/2016  Admitted From: Home Disposition:  Home.   Recommendations for Outpatient Follow-up:  1. Follow up with PCP in 1-2 weeks 2. Please obtain BMP/CBC in one week 3. Please follow up with dermatology as recommended.      Discharge Condition:stable.  CODE STATUS: full code.  Diet recommendation: regular diet.   Brief/Interim Summary: This  is a 51 y.o.femalewith medical history significant of HTN and depression with recent treatment for persistent and worsening rash. Patient started with a rash in November. [facility] and was treated for bed bugs then scabies without any benefit, her rash worsened after the medications were given at health department. She doesn't know which medication made the rash worse.   Discharge Diagnoses:  Principal Problem:   Sepsis ([place]) Active Problems:   Tobacco use disorder   Rash   Malnutrition ([place])  1.Generalized dry scaly, itchy, exfoliative rash with signs of dehydration and hypotension. Suspect super infection on top of rash. She completed 7 days of IV vancomycin, for MRSA Infection in the skin, followed by doxycycline to complete the course.  Skin biopsy shows dermal  hypersensitivity reaction. As she was improving , we have transitioned to po prednisone,  over the next 4 weeks.  Recommend outpatient follow up with dermatology in one week.   2. Depression. On Celexa continue.  3. Hypertension. Well controlled. No new changes in meds.   4. Normocytic anemia: stable around 10.   5. Leukocytosis: suspect from steroids. Repeat labs in am show persistent leukocytosis. Marland Kitchen     Discharge Instructions  Discharge Instructions    Diet - low sodium heart healthy    Complete by:  As directed    Discharge instructions    Complete by:  As directed    Follow up  with PCP/ Health Department in one week. Please follow up with dermatology in one week.   Discharge instructions    Complete by:  As directed    Follow up with PCP in one week. Please follow up with dermatology before the steroids are completed.     Allergies as of 09/26/2016      Reactions   Erythromycin Nausea And Vomiting, Rash      Medication List    STOP taking these medications   cefdinir 300 MG capsule Commonly known as:  OMNICEF     TAKE these medications   aspirin 325 MG tablet Take 325 mg by mouth daily as needed for moderate pain.   bacitracin ointment Apply topically 2 (two) times daily.   betamethasone dipropionate 0.05 % cream Commonly known as:  DIPROLENE Apply 0.05 % topically 2 (two) times daily.   citalopram 20 MG tablet Commonly known as:  CELEXA Take 10 mg by mouth daily.   clobetasol ointment 0.05 % Commonly known as:  TEMOVATE Apply topically 2 (two) times daily.   doxycycline 100 mg in dextrose 5 % 250 mL Inject 100 mg into the vein every 12 (twelve) hours.   hydrOXYzine 25 MG tablet Commonly known as:  ATARAX/VISTARIL Take 1 tablet (25 mg total) by mouth 3 (three) times daily as needed for itching.   lisinopril 20 MG tablet Commonly known as:  PRINIVIL,ZESTRIL Take 20 mg by mouth daily.   predniSONE 20 MG tablet Commonly known as:  DELTASONE Prednisone 40 mg daily for 5 days followed by Prednisone 30 mg  daily for 5 days followed by  Prednisone 20 mg daily for 5 days followed by  Prednisone 10 mg daily for 5 days followed by  Prednisone 10 mg daily every other day for 10 days.      Follow-up Information    [name], NP. Schedule an appointment as soon as possible for a visit in 1 week(s).   Specialty:  Nurse Practitioner Contact information: [address] [place] [address] [phone]        Wende Neighbors, MD Follow up.   Specialty:  Internal Medicine Why:  dermatology follow up.  Contact information: [place] [address] [phone]          Allergies  Allergen Reactions  . Erythromycin Nausea And Vomiting and Rash     Consultations:  dermatology   Procedures/Studies: Dg Chest Port 1 View  Result Date: 09/13/2016 CLINICAL DATA:  Dyspnea. EXAM: PORTABLE CHEST 1 VIEW COMPARISON:  Chest CT dated 02/16/2015 FINDINGS: Chronic prominence of the main pulmonary arteries. Overall heart size and pulmonary vascularity are normal. No infiltrates or effusions. No bone abnormality. IMPRESSION: No acute abnormality. Chronic prominence of the main pulmonary arteries. Electronically Signed   By: Lorriane Shire M.D.   On: 09/13/2016 09:44       Subjective: No new complaints. Wants to go home.   Discharge Exam: Vitals:   09/25/16 2137 09/26/16 0534  BP: (!) 149/91 138/83  Pulse: 66 80  Resp: 18 18  Temp: 98.4 F (36.9 C) 98.2 F (36.8 C)   Vitals:   09/25/16 0640 09/25/16 1533 09/25/16 2137 09/26/16 0534  BP: 130/75 (!) 142/89 (!) 149/91 138/83  Pulse: 63 69 66 80  Resp: 15 16 18 18   Temp: 97.5 F (36.4 C) 98.6 F (37 C) 98.4 F (36.9 C) 98.2 F (36.8 C)  TempSrc: Oral  Oral Oral  SpO2: 100% 96% 98% 96%  Weight:      Height:        General: Pt is alert, awake, not in acute distress Cardiovascular: RRR, S1/S2 +, no rubs, no gallops Respiratory: CTA bilaterally, no wheezing, no rhonchi Abdominal: Soft, NT, ND, bowel sounds + Extremities: pedal edema present.     The results of significant diagnostics from this hospitalization (including imaging, microbiology, ancillary and laboratory) are listed below for reference.     Microbiology: Recent Results (from the past 240 hour(s))  Culture, blood (Routine X 2) w Reflex to ID Panel     Status: None   Collection Time: 09/17/16  8:28 PM  Result Value Ref Range Status   Specimen Description BLOOD LEFT ARM  Final   Special Requests BOTTLES DRAWN AEROBIC AND ANAEROBIC [place]  Final   Culture NO GROWTH 5 DAYS   Final   Report Status 09/22/2016 FINAL  Final  Culture, blood (Routine X 2) w Reflex to ID Panel     Status: None   Collection Time: 09/17/16  8:49 PM  Result Value Ref Range Status   Specimen Description BLOOD LEFT FOREARM  Final   Special Requests BOTTLES DRAWN AEROBIC AND ANAEROBIC [place]  Final   Culture NO GROWTH 5 DAYS  Final   Report Status 09/22/2016 FINAL  Final  Culture, Urine     Status: Abnormal   Collection Time: 09/17/16 10:19 PM  Result Value Ref Range Status   Specimen Description URINE, CLEAN CATCH  Final   Special Requests NONE  Final   Culture MULTIPLE SPECIES PRESENT, SUGGEST RECOLLECTION (A)  Final   Report Status 09/19/2016 FINAL  Final  Culture, fungus without smear     Status: None   Collection Time: 09/18/16  1:17 AM  Result Value Ref Range Status   Specimen Description SCRAPING SKIN  Final   Special Requests Normal  Final   Culture   Final    NO FUNGUS ISOLATED AFTER 3 DAYS Performed at [facility]    Report Status 09/26/2016 FINAL  Final  Aerobic Culture (superficial specimen)     Status: None   Collection Time: 09/18/16  1:17 AM  Result Value Ref Range Status   Specimen Description SCRAPING SKIN  Final   Special Requests Normal  Final   Gram Stain   Final    NO WBC SEEN RARE SQUAMOUS EPITHELIAL CELLS PRESENT RARE GRAM POSITIVE COCCI Performed at [facility]    Culture RARE METHICILLIN RESISTANT STAPHYLOCOCCUS AUREUS  Final   Report Status 09/24/2016 FINAL  Final   Organism ID, Bacteria METHICILLIN RESISTANT STAPHYLOCOCCUS AUREUS  Final      Susceptibility   Methicillin resistant staphylococcus aureus - MIC*    CIPROFLOXACIN <=0.5 SENSITIVE Sensitive     ERYTHROMYCIN 0.5 SENSITIVE Sensitive     GENTAMICIN <=0.5 SENSITIVE Sensitive     OXACILLIN 0.5 RESISTANT Resistant     TETRACYCLINE <=1 SENSITIVE Sensitive     VANCOMYCIN 1 SENSITIVE Sensitive     TRIMETH/SULFA <=10 SENSITIVE Sensitive     CLINDAMYCIN <=0.25 SENSITIVE  Sensitive     RIFAMPIN <=0.5 SENSITIVE Sensitive     Inducible Clindamycin NEGATIVE Sensitive     * RARE METHICILLIN RESISTANT STAPHYLOCOCCUS AUREUS  MRSA PCR Screening     Status: None   Collection Time: 09/19/16  2:46 AM  Result Value Ref Range Status   MRSA by PCR NEGATIVE NEGATIVE Final    Comment:        The GeneXpert MRSA Assay (FDA approved for NASAL specimens only), is one component of a comprehensive MRSA colonization surveillance program. It is not intended to diagnose MRSA infection nor to guide or monitor treatment for MRSA infections.      Labs: BNP (last 3 results) No results for input(s): BNP in the last 8760 hours. Basic Metabolic Panel:  Recent Labs Lab 09/20/16 0512 09/23/16 0710 09/24/16 0659  NA 139 139  --   K 4.2 4.0  --   CL 109 106  --   CO2 24 26  --   GLUCOSE 130* 90  --   BUN 15 20  --   CREATININE 0.81 0.83 0.75  CALCIUM 8.3* 8.4*  --    Liver Function Tests: No results for input(s): AST, ALT, ALKPHOS, BILITOT, PROT, ALBUMIN in the last 168 hours. No results for input(s): LIPASE, AMYLASE in the last 168 hours. No results for input(s): AMMONIA in the last 168 hours. CBC:  Recent Labs Lab 09/20/16 0512 09/23/16 0710 09/25/16 1124  WBC 18.9* 19.9* 18.0*  HGB 10.0* 11.4* 10.9*  HCT 30.5* 35.0* 33.1*  MCV 92.4 93.8 93.2  PLT 423* 414* 363   Cardiac Enzymes: No results for input(s): CKTOTAL, CKMB, CKMBINDEX, TROPONINI in the last 168 hours. BNP: Invalid input(s): POCBNP CBG: No results for input(s): GLUCAP in the last 168 hours. D-Dimer No results for input(s): DDIMER in the last 72 hours. Hgb A1c No results for input(s): HGBA1C in the last 72 hours. Lipid Profile No results for input(s): CHOL, HDL, LDLCALC, TRIG, CHOLHDL, LDLDIRECT in the last 72 hours. Thyroid function studies No results  for input(s): TSH, T4TOTAL, T3FREE, THYROIDAB in the last 72 hours.  Invalid input(s): FREET3 Anemia work up No results for input(s):  VITAMINB12, FOLATE, FERRITIN, TIBC, IRON, RETICCTPCT in the last 72 hours. Urinalysis    Component Value Date/Time   COLORURINE YELLOW 07/24/2014 2155   APPEARANCEUR CLEAR 07/24/2014 2155   LABSPEC 1.010 07/24/2014 2155   PHURINE 6.5 07/24/2014 2155   GLUCOSEU NEGATIVE 07/24/2014 2155   HGBUR NEGATIVE 07/24/2014 2155   BILIRUBINUR NEGATIVE 07/24/2014 2155   KETONESUR NEGATIVE 07/24/2014 2155   PROTEINUR NEGATIVE 07/24/2014 2155   UROBILINOGEN 0.2 07/24/2014 2155   NITRITE NEGATIVE 07/24/2014 2155   LEUKOCYTESUR TRACE (A) 07/24/2014 2155   Sepsis Labs Invalid input(s): PROCALCITONIN,  WBC,  LACTICIDVEN Microbiology Recent Results (from the past 240 hour(s))  Culture, blood (Routine X 2) w Reflex to ID Panel     Status: None   Collection Time: 09/17/16  8:28 PM  Result Value Ref Range Status   Specimen Description BLOOD LEFT ARM  Final   Special Requests BOTTLES DRAWN AEROBIC AND ANAEROBIC [place]  Final   Culture NO GROWTH 5 DAYS  Final   Report Status 09/22/2016 FINAL  Final  Culture, blood (Routine X 2) w Reflex to ID Panel     Status: None   Collection Time: 09/17/16  8:49 PM  Result Value Ref Range Status   Specimen Description BLOOD LEFT FOREARM  Final   Special Requests BOTTLES DRAWN AEROBIC AND ANAEROBIC [place]  Final   Culture NO GROWTH 5 DAYS  Final   Report Status 09/22/2016 FINAL  Final  Culture, Urine     Status: Abnormal   Collection Time: 09/17/16 10:19 PM  Result Value Ref Range Status   Specimen Description URINE, CLEAN CATCH  Final   Special Requests NONE  Final   Culture MULTIPLE SPECIES PRESENT, SUGGEST RECOLLECTION (A)  Final   Report Status 09/19/2016 FINAL  Final  Culture, fungus without smear     Status: None   Collection Time: 09/18/16  1:17 AM  Result Value Ref Range Status   Specimen Description SCRAPING SKIN  Final   Special Requests Normal  Final   Culture   Final    NO FUNGUS ISOLATED AFTER 3 DAYS Performed at [facility]    Report  Status 09/26/2016 FINAL  Final  Aerobic Culture (superficial specimen)     Status: None   Collection Time: 09/18/16  1:17 AM  Result Value Ref Range Status   Specimen Description SCRAPING SKIN  Final   Special Requests Normal  Final   Gram Stain   Final    NO WBC SEEN RARE SQUAMOUS EPITHELIAL CELLS PRESENT RARE GRAM POSITIVE COCCI Performed at [place]  Final   Report Status 09/24/2016 FINAL  Final   Organism ID, Bacteria METHICILLIN RESISTANT STAPHYLOCOCCUS AUREUS  Final      Susceptibility   Methicillin resistant staphylococcus aureus - MIC*    CIPROFLOXACIN <=0.5 SENSITIVE Sensitive     ERYTHROMYCIN 0.5 SENSITIVE Sensitive     GENTAMICIN <=0.5 SENSITIVE Sensitive     OXACILLIN 0.5 RESISTANT Resistant     TETRACYCLINE <=1 SENSITIVE Sensitive     VANCOMYCIN 1 SENSITIVE Sensitive     TRIMETH/SULFA <=10 SENSITIVE Sensitive     CLINDAMYCIN <=0.25 SENSITIVE Sensitive     RIFAMPIN <=0.5 SENSITIVE Sensitive     Inducible Clindamycin NEGATIVE Sensitive     * RARE METHICILLIN RESISTANT STAPHYLOCOCCUS AUREUS  MRSA  PCR Screening     Status: None   Collection Time: 09/19/16  2:46 AM  Result Value Ref Range Status   MRSA by PCR NEGATIVE NEGATIVE Final    Comment:        The GeneXpert MRSA Assay (FDA approved for NASAL specimens only), is one component of a comprehensive MRSA colonization surveillance program. It is not intended to diagnose MRSA infection nor to guide or monitor treatment for MRSA infections.      Time coordinating discharge: Over 30 minutes  SIGNED:   Hosie Poisson, MD  [facility] 09/26/2016, 12:38 PM Pager   If 7PM-7AM, please contact night-coverage [email or web address] Password [email or web address]

## 2016-10-11 LAB — CULTURE, FUNGUS WITHOUT SMEAR: SPECIAL REQUESTS: NORMAL

## 2016-11-07 ENCOUNTER — Emergency Department (HOSPITAL_COMMUNITY)
Admission: EM | Admit: 2016-11-07 | Discharge: 2016-11-07 | Disposition: A | Discharge status: Home / Self Care | Payer: Self-pay | Attending: Emergency Medicine | Admitting: Emergency Medicine

## 2016-11-07 ENCOUNTER — Encounter (HOSPITAL_COMMUNITY): Payer: Self-pay | Admitting: Emergency Medicine

## 2016-11-07 DIAGNOSIS — Z4802 Encounter for removal of sutures: Secondary | ICD-10-CM | POA: Insufficient documentation

## 2016-11-07 DIAGNOSIS — F17210 Nicotine dependence, cigarettes, uncomplicated: Secondary | ICD-10-CM | POA: Insufficient documentation

## 2016-11-07 DIAGNOSIS — I160 Hypertensive urgency: Secondary | ICD-10-CM | POA: Insufficient documentation

## 2016-11-07 DIAGNOSIS — Z5189 Encounter for other specified aftercare: Secondary | ICD-10-CM

## 2016-11-07 DIAGNOSIS — Z79899 Other long term (current) drug therapy: Secondary | ICD-10-CM | POA: Insufficient documentation

## 2016-11-07 DIAGNOSIS — Z7982 Long term (current) use of aspirin: Secondary | ICD-10-CM | POA: Insufficient documentation

## 2016-11-07 MED ORDER — CEPHALEXIN 500 MG PO CAPS: 500.0000 mg | ORAL_CAPSULE | Freq: Four times a day (QID) | ORAL | 0 refills | Status: AC

## 2016-11-07 NOTE — ED Triage Notes (Signed)
Patient states had stitches placed wound on her back December 31, but has not returned to get stitch removed. States pain around area of stitch.

## 2016-11-07 NOTE — ED Notes (Signed)
Instructed pt to take all of antibiotics as prescribed. 

## 2016-11-07 NOTE — ED Provider Notes (Signed)
[facility] DEPT Provider Note   CSN: [id number] Arrival date & time: 11/07/16  [id number]     History   Chief Complaint Chief Complaint  Patient presents with  . Wound Check    HPI Meghan Simmons is a 51 y.o. female presents today for wound recheck. She reports having stitches placed on wound on her back December 31 and has not returned to get stitches removed. She reports associated pain around the area. She denies any fevers or chills, nausea, vomiting she reports not trying anything. She states nothing makes it better or worse.  The history is provided by the patient. No language interpreter was used.    Past Medical History:  Diagnosis Date  . CVA (cerebral vascular accident) ([place]) 2015  . Heart murmur   . Hypertension     Patient Active Problem List   Diagnosis Date Noted  . Sepsis ([place]) 09/17/2016  . Rash 09/17/2016  . Malnutrition ([place]) 09/17/2016  . Cellulitis 09/13/2016  . Scabies exposure 09/13/2016  . ASD (atrial septal defect) 07/25/2014  . Hypertensive urgency 07/24/2014  . Facial droop 07/24/2014  . Tobacco use disorder 07/24/2014    Past Surgical History:  Procedure Laterality Date  . CHOLECYSTECTOMY    . TEE WITHOUT CARDIOVERSION N/A 11/27/2014   Procedure: TRANSESOPHAGEAL ECHOCARDIOGRAM (TEE);  Surgeon: [name], MD;  Location: [facility] ENDOSCOPY;  Service: Cardiovascular;  Laterality: N/A;    OB History    Gravida Para Term Preterm AB Living   1 1 1     1    SAB TAB Ectopic Multiple Live Births                   Home Medications    Prior to Admission medications   Medication Sig Start Date End Date Taking? Authorizing Provider  aspirin 325 MG tablet Take 325 mg by mouth daily as needed for moderate pain.    Historical Provider, MD  bacitracin ointment Apply topically 2 (two) times daily. 09/24/16   Hosie Poisson, MD  betamethasone dipropionate (DIPROLENE) 0.05 % cream Apply 0.05 % topically 2 (two) times daily.    Historical Provider, MD    cephALEXin (KEFLEX) 500 MG capsule Take 1 capsule (500 mg total) by mouth 4 (four) times daily. 11/07/16 11/12/16  [place] Manuel Laurisa Sahakian, PA  citalopram (CELEXA) 20 MG tablet Take 10 mg by mouth daily.    Historical Provider, MD  clobetasol ointment (TEMOVATE) 0.05 % Apply topically 2 (two) times daily. 09/26/16   Hosie Poisson, MD  doxycycline (VIBRAMYCIN) 50 MG capsule Take 2 capsules (100 mg total) by mouth 2 (two) times daily. 09/26/16   Hosie Poisson, MD  hydrOXYzine (ATARAX/VISTARIL) 25 MG tablet Take 1 tablet (25 mg total) by mouth 3 (three) times daily as needed for itching. 09/14/16   Velvet Bathe, MD  lisinopril (PRINIVIL,ZESTRIL) 20 MG tablet Take 20 mg by mouth daily.    Historical Provider, MD  predniSONE (DELTASONE) 20 MG tablet Prednisone 40 mg daily for 5 days followed by Prednisone 30 mg daily for 5 days followed by  Prednisone 20 mg daily for 5 days followed by  Prednisone 10 mg daily for 5 days followed by  Prednisone 10 mg daily every other day for 10 days. 09/26/16   Hosie Poisson, MD    Family History Family History  Problem Relation Age of Onset  . Kidney failure Father 72    dies while at dialysis    Social History Social History  Substance Use Topics  .  Smoking status: Current Every Day Smoker    Packs/day: 1.00    Types: Cigarettes    Start date: 2008  . Smokeless tobacco: Never Used     Comment: declines patch  . Alcohol use No     Comment: none in 2+ years     Allergies   Erythromycin   Review of Systems Review of Systems  Constitutional: Negative for chills and fever.  Skin: Positive for wound.     Physical Exam Updated Vital Signs BP 107/65 (BP Location: Left Arm)   Pulse 77   Temp 98 F (36.7 C) (Oral)   Resp 16   Ht 5\' 4"  (1.626 m)   Wt 89.8 kg   SpO2 100%   BMI 33.99 kg/m   Physical Exam  Constitutional: She appears well-developed and well-nourished.  Well appearing  HENT:  Head: Normocephalic and atraumatic.  Nose: Nose normal.   Eyes: Conjunctivae and EOM are normal.  Neck: Normal range of motion.  Cardiovascular: Normal rate.   Pulmonary/Chest: Effort normal. No respiratory distress.  Normal work of breathing. No respiratory distress noted.   Abdominal: Soft.  Musculoskeletal: Normal range of motion.  Neurological: She is alert.  Skin: Skin is warm.  Midleft back with evidence of one suture left in place. There is minimal to mild surrounding erythema.  Psychiatric: She has a normal mood and affect. Her behavior is normal.  Nursing note and vitals reviewed.    ED Treatments / Results  Labs (all labs ordered are listed, but only abnormal results are displayed) Labs Reviewed - No data to display  EKG  EKG Interpretation None       Radiology No results found.  Procedures Procedures (including critical care time)  Medications Ordered in ED Medications - No data to display   Initial Impression / Assessment and Plan / ED Course  I have reviewed the triage vital signs and the nursing notes.  Pertinent labs & imaging results that were available during my care of the patient were reviewed by me and considered in my medical decision making (see chart for details).    Staple removal   Pt to ER for staple/suture removal and wound check as above. Procedure tolerated well. Vitals normal, minimal to mild surrounding erythema. Considering the length of suture placement in the normal to mild surrounding erythema will start her on Keflex 4 times a day for 5 days. Patient encouraged to follow up with primary care provider in 3-5 days as needed. Reasons to immediately return to emergency department discussed.    Final Clinical Impressions(s) / ED Diagnoses   Final diagnoses:  Visit for wound check  Visit for suture removal    New Prescriptions New Prescriptions   CEPHALEXIN (KEFLEX) 500 MG CAPSULE    Take 1 capsule (500 mg total) by mouth 4 (four) times daily.     [place],  [place] 11/07/16 [place], MD 11/09/16 [phone]

## 2016-11-07 NOTE — Discharge Instructions (Signed)
Please take Keflex 4 times a day for 5 days. Use warm compresses to the area. Follow-up with your primary care provider in 3-5 days.  Get help right away if: You develop severe swelling around the injury site. Your pain suddenly increases and is severe. You develop painful lumps near the wound or on skin that is anywhere on your body. You have a red streak going away from your wound. The wound is on your hand or foot and you cannot properly move a finger or toe. The wound is on your hand or foot and you notice that your fingers or toes look pale or bluish.

## 2018-11-01 ENCOUNTER — Other Ambulatory Visit (HOSPITAL_COMMUNITY): Payer: Self-pay | Admitting: *Deleted

## 2018-11-01 DIAGNOSIS — R229 Localized swelling, mass and lump, unspecified: Principal | ICD-10-CM

## 2018-11-01 DIAGNOSIS — IMO0002 Reserved for concepts with insufficient information to code with codable children: Secondary | ICD-10-CM

## 2018-11-07 ENCOUNTER — Ambulatory Visit (HOSPITAL_COMMUNITY): Payer: PRIVATE HEALTH INSURANCE

## 2018-11-07 ENCOUNTER — Ambulatory Visit (HOSPITAL_COMMUNITY)
Admission: RE | Admit: 2018-11-07 | Discharge: 2018-11-07 | Disposition: A | Discharge status: Home / Self Care | Payer: PRIVATE HEALTH INSURANCE | Source: Ambulatory Visit | Attending: *Deleted | Admitting: *Deleted

## 2018-11-07 DIAGNOSIS — IMO0002 Reserved for concepts with insufficient information to code with codable children: Secondary | ICD-10-CM

## 2018-11-07 DIAGNOSIS — R229 Localized swelling, mass and lump, unspecified: Secondary | ICD-10-CM | POA: Insufficient documentation

## 2019-02-24 ENCOUNTER — Other Ambulatory Visit: Payer: Self-pay

## 2019-02-24 ENCOUNTER — Emergency Department (HOSPITAL_COMMUNITY)
Admission: EM | Admit: 2019-02-24 | Discharge: 2019-02-24 | Disposition: A | Discharge status: Home / Self Care | Payer: Self-pay | Attending: Emergency Medicine | Admitting: Emergency Medicine

## 2019-02-24 ENCOUNTER — Encounter (HOSPITAL_COMMUNITY): Payer: Self-pay | Admitting: Emergency Medicine

## 2019-02-24 DIAGNOSIS — R0789 Other chest pain: Secondary | ICD-10-CM | POA: Insufficient documentation

## 2019-02-24 DIAGNOSIS — Z5321 Procedure and treatment not carried out due to patient leaving prior to being seen by health care provider: Secondary | ICD-10-CM | POA: Insufficient documentation
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
## 2019-04-22 ENCOUNTER — Other Ambulatory Visit (HOSPITAL_COMMUNITY): Payer: Self-pay | Admitting: *Deleted

## 2019-04-22 DIAGNOSIS — IMO0002 Reserved for concepts with insufficient information to code with codable children: Secondary | ICD-10-CM

## 2019-05-13 ENCOUNTER — Ambulatory Visit (HOSPITAL_COMMUNITY): Admission: RE | Admit: 2019-05-13 | Payer: Self-pay | Source: Ambulatory Visit

## 2019-05-20 ENCOUNTER — Ambulatory Visit (HOSPITAL_COMMUNITY)
Admission: RE | Admit: 2019-05-20 | Discharge: 2019-05-20 | Disposition: A | Discharge status: Home / Self Care | Payer: PRIVATE HEALTH INSURANCE | Source: Ambulatory Visit | Attending: *Deleted | Admitting: *Deleted

## 2019-05-20 ENCOUNTER — Other Ambulatory Visit: Payer: Self-pay

## 2019-05-20 DIAGNOSIS — R229 Localized swelling, mass and lump, unspecified: Secondary | ICD-10-CM | POA: Insufficient documentation

## 2019-05-20 DIAGNOSIS — IMO0002 Reserved for concepts with insufficient information to code with codable children: Secondary | ICD-10-CM

## 2019-12-05 ENCOUNTER — Other Ambulatory Visit (HOSPITAL_COMMUNITY): Payer: Self-pay | Admitting: *Deleted

## 2019-12-05 DIAGNOSIS — N6310 Unspecified lump in the right breast, unspecified quadrant: Secondary | ICD-10-CM

## 2019-12-30 ENCOUNTER — Other Ambulatory Visit (HOSPITAL_COMMUNITY): Payer: Self-pay

## 2019-12-30 ENCOUNTER — Ambulatory Visit (HOSPITAL_COMMUNITY)
Admission: RE | Admit: 2019-12-30 | Discharge: 2019-12-30 | Disposition: A | Discharge status: Home / Self Care | Payer: PRIVATE HEALTH INSURANCE | Source: Ambulatory Visit | Attending: *Deleted | Admitting: *Deleted

## 2019-12-30 ENCOUNTER — Other Ambulatory Visit (HOSPITAL_COMMUNITY): Payer: Self-pay | Admitting: *Deleted

## 2019-12-30 ENCOUNTER — Other Ambulatory Visit: Payer: Self-pay

## 2019-12-30 DIAGNOSIS — N6310 Unspecified lump in the right breast, unspecified quadrant: Secondary | ICD-10-CM | POA: Diagnosis not present

## 2020-03-09 ENCOUNTER — Other Ambulatory Visit: Payer: Self-pay

## 2020-03-09 ENCOUNTER — Emergency Department (HOSPITAL_COMMUNITY): Payer: Self-pay

## 2020-03-09 ENCOUNTER — Encounter (HOSPITAL_COMMUNITY): Payer: Self-pay | Admitting: *Deleted

## 2020-03-09 ENCOUNTER — Emergency Department (HOSPITAL_COMMUNITY)
Admission: EM | Admit: 2020-03-09 | Discharge: 2020-03-09 | Disposition: A | Discharge status: Home / Self Care | Payer: Self-pay | Attending: Emergency Medicine | Admitting: Emergency Medicine

## 2020-03-09 DIAGNOSIS — I10 Essential (primary) hypertension: Secondary | ICD-10-CM | POA: Insufficient documentation

## 2020-03-09 DIAGNOSIS — Z881 Allergy status to other antibiotic agents status: Secondary | ICD-10-CM | POA: Insufficient documentation

## 2020-03-09 DIAGNOSIS — R220 Localized swelling, mass and lump, head: Secondary | ICD-10-CM

## 2020-03-09 DIAGNOSIS — F17210 Nicotine dependence, cigarettes, uncomplicated: Secondary | ICD-10-CM | POA: Insufficient documentation

## 2020-03-09 DIAGNOSIS — L03213 Periorbital cellulitis: Secondary | ICD-10-CM | POA: Insufficient documentation

## 2020-03-09 DIAGNOSIS — Z7982 Long term (current) use of aspirin: Secondary | ICD-10-CM | POA: Insufficient documentation

## 2020-03-09 DIAGNOSIS — Z8673 Personal history of transient ischemic attack (TIA), and cerebral infarction without residual deficits: Secondary | ICD-10-CM | POA: Insufficient documentation

## 2020-03-09 DIAGNOSIS — Z79899 Other long term (current) drug therapy: Secondary | ICD-10-CM | POA: Insufficient documentation

## 2020-03-09 DIAGNOSIS — K047 Periapical abscess without sinus: Secondary | ICD-10-CM | POA: Insufficient documentation

## 2020-03-09 LAB — COMPREHENSIVE METABOLIC PANEL
ALT: 10 U/L (ref 0–44)
AST: 13 U/L — ABNORMAL LOW (ref 15–41)
Albumin: 3.9 g/dL (ref 3.5–5.0)
Alkaline Phosphatase: 89 U/L (ref 38–126)
Anion gap: 11 (ref 5–15)
BUN: 8 mg/dL (ref 6–20)
CO2: 24 mmol/L (ref 22–32)
Calcium: 8.9 mg/dL (ref 8.9–10.3)
Chloride: 109 mmol/L (ref 98–111)
Creatinine, Ser: 0.95 mg/dL (ref 0.44–1.00)
GFR calc Af Amer: >60 mL/min (ref >60)
GFR calc non Af Amer: >60 mL/min (ref >60)
Glucose, Bld: 84 mg/dL (ref 70–99)
Potassium: 4.1 mmol/L (ref 3.5–5.1)
Sodium: 144 mmol/L (ref 135–145)
Total Bilirubin: 0.4 mg/dL (ref 0.3–1.2)
Total Protein: 7.2 g/dL (ref 6.5–8.1)

## 2020-03-09 LAB — CBC
HCT: 43 % (ref 36.0–46.0)
Hemoglobin: 13.8 g/dL (ref 12.0–15.0)
MCH: 29.4 pg (ref 26.0–34.0)
MCHC: 32.1 g/dL (ref 30.0–36.0)
MCV: 91.5 fL (ref 80.0–100.0)
Platelets: 243 10*3/uL (ref 150–400)
RBC: 4.7 MIL/uL (ref 3.87–5.11)
RDW: 13.7 % (ref 11.5–15.5)
WBC: 10.2 10*3/uL (ref 4.0–10.5)
nRBC: 0 % (ref 0.0–0.2)

## 2020-03-09 MED ORDER — AMOXICILLIN-POT CLAVULANATE 875-125 MG PO TABS
1.0000 | ORAL_TABLET | Freq: Two times a day (BID) | ORAL | 0 refills | Status: AC
Start: 2020-03-09 — End: 2020-03-16

## 2020-03-09 MED ORDER — IOHEXOL 300 MG/ML  SOLN
75.0000 mL | Freq: Once | INTRAMUSCULAR | Status: AC | PRN
  Administered 2020-03-09: 75 mL via INTRAVENOUS

## 2020-03-09 MED ORDER — SULFAMETHOXAZOLE-TRIMETHOPRIM 800-160 MG PO TABS
1.0000 | ORAL_TABLET | Freq: Two times a day (BID) | ORAL | 0 refills | Status: AC
Start: 2020-03-09 — End: 2020-03-16

## 2020-03-09 MED ORDER — ACETAMINOPHEN 325 MG PO TABS
650.0000 mg | ORAL_TABLET | Freq: Once | ORAL | Status: AC
  Administered 2020-03-09: 14:00:00 650 mg via ORAL
  Filled 2020-03-09: qty 2

## 2020-03-09 NOTE — ED Notes (Signed)
Patient transported to CT 

## 2020-03-09 NOTE — Care Management (Signed)
[facility] ED CM contacted by [facility] CSW concerning patient needing medication assistance. [place] program and guidelines were discussed with patient as per CSW. ED CM entered patient into [place] for [facility] medication assistance program

## 2020-03-09 NOTE — Clinical Social Work Note (Signed)
Transition of Care [facility]) - Emergency Department Mini Assessment  Patient Details  Name: Meghan Simmons MRN: [id number] Date of Birth: August 31, 1966  Transition of [facility]) CM/SW Contact:    Sherie Don, LCSW Phone Number: 03/09/2020, 5:11 PM  Clinical Narrative: Patient is a 54 year old female who presented to the ED for facial swelling. TOC received consult for medication assistance. CSW provided patient with [facility] voucher. Patient also agreeable to referral to financial counselor to determine Medicaid eligibility. RNCM assisted with [employer] voucher. TOC signing off.  ED Mini Assessment: What brought you to the Emergency Department? : Facial swelling Barriers to Discharge: ED Barriers Resolved Barrier interventions: [place] voucher; referral to financial counselor Means of departure: Car Interventions which prevented an admission or readmission: Medication Review, Other (must enter comment) (Referral to financial counselor)  Patient Contact and Communications Key Contact 1: [name] (Development worker, community)  Admission diagnosis:  facial swelling Patient Active Problem List   Diagnosis Date Noted  . Sepsis ([place]) 09/17/2016  . Rash 09/17/2016  . Malnutrition ([place]) 09/17/2016  . Cellulitis 09/13/2016  . Scabies exposure 09/13/2016  . ASD (atrial septal defect) 07/25/2014  . Hypertensive urgency 07/24/2014  . Facial droop 07/24/2014  . Tobacco use disorder 07/24/2014   PCP:  Raiford Simmonds., PA-C Pharmacy:   [place], [place] - [address] [place] [id number] [place] [address] Phone: [phone] Fax: [phone]

## 2020-03-09 NOTE — Discharge Instructions (Addendum)
You are seen today for dental abscess.  I want you to take the antibiotics as prescribed.  Make sure you eat plenty of food and drink plenty of water when taking these antibiotics.  If you start having any allergic reactions to these antibiotics please come back to the emergency department.  If you start having any new or worsening concerning symptoms please come back to the emergency department.  I want you to follow-up with your primary care in the next couple of days, this is extremely important.  It is imperative for you to go to a dentist, most of your teeth need to come out.  Use the attached guides.  If you have any worsening vision any to come back to the emergency department.  If you still have to have eye pain or blurry vision in the next 2 days I want you to see an ophthalmologist, as we discussed.

## 2020-03-09 NOTE — ED Provider Notes (Signed)
[facility] Provider Note   CSN: [id number] Arrival date & time: 03/09/20  1239     History Chief Complaint  Patient presents with  . Facial Swelling    Meghan Simmons is a 54 y.o. female with pertinent past medical history of CVA, heart murmur, hypertension that presents the emergency department today for left facial swelling. States that this started yesterday morning and has progressed while waking up. States that she took a bite out of peppermint yesterday and felt that one of her tooth broke. States that she started having pain and swelling on that side. Has progressed up into her eye. States that this morning she noticed starting having blurry vision in her left eye along with painful movements of her left eye. States that she was in normal health before this. Denies any fevers, chills, URI-like symptoms. Denies cough, shortness of breath, chest pain, drooling, trismus, sore throat.  Denies any headache, trouble opening her mouth, chewing, denies dysphagia, and aphasia, trouble breathing, angioedema, rashes, insect bites.  Denies any eye discharge, eye itching, eye redness, photophobia, floaters, vision loss.  HPI     Past Medical History:  Diagnosis Date  . CVA (cerebral vascular accident) ([place]) 2015  . Heart murmur   . Hypertension     Patient Active Problem List   Diagnosis Date Noted  . Sepsis ([place]) 09/17/2016  . Rash 09/17/2016  . Malnutrition ([place]) 09/17/2016  . Cellulitis 09/13/2016  . Scabies exposure 09/13/2016  . ASD (atrial septal defect) 07/25/2014  . Hypertensive urgency 07/24/2014  . Facial droop 07/24/2014  . Tobacco use disorder 07/24/2014    Past Surgical History:  Procedure Laterality Date  . CHOLECYSTECTOMY    . TEE WITHOUT CARDIOVERSION N/A 11/27/2014   Procedure: TRANSESOPHAGEAL ECHOCARDIOGRAM (TEE);  Surgeon: [name], MD;  Location: [facility] ENDOSCOPY;  Service: Cardiovascular;  Laterality: N/A;     OB History    Gravida    1   Para  1   Term  1   Preterm      AB      Living  1     SAB      TAB      Ectopic      Multiple      Live Births              Family History  Problem Relation Age of Onset  . Kidney failure Father 22       dies while at dialysis    Social History   Tobacco Use  . Smoking status: Current Every Day Smoker    Packs/day: 1.00    Types: Cigarettes    Start date: 2008  . Smokeless tobacco: Never Used  . Tobacco comment: declines patch  Substance Use Topics  . Alcohol use: No    Alcohol/week: 0.0 standard drinks    Comment: none in 2+ years  . Drug use: Not Currently    Types: Marijuana    Home Medications Prior to Admission medications   Medication Sig Start Date End Date Taking? Authorizing Provider  aspirin 325 MG tablet Take 325 mg by mouth daily as needed for moderate pain.    [provider]  bacitracin ointment Apply topically 2 (two) times daily. 09/24/16   Hosie Poisson, MD  betamethasone dipropionate (DIPROLENE) 0.05 % cream Apply 0.05 % topically 2 (two) times daily.    [provider]  citalopram (CELEXA) 20 MG tablet Take 10 mg by mouth daily.  [provider]  clobetasol ointment (TEMOVATE) 0.05 % Apply topically 2 (two) times daily. 09/26/16   Hosie Poisson, MD  doxycycline (VIBRAMYCIN) 50 MG capsule Take 2 capsules (100 mg total) by mouth 2 (two) times daily. 09/26/16   Hosie Poisson, MD  hydrOXYzine (ATARAX/VISTARIL) 25 MG tablet Take 1 tablet (25 mg total) by mouth 3 (three) times daily as needed for itching. 09/14/16   Velvet Bathe, MD  lisinopril (PRINIVIL,ZESTRIL) 20 MG tablet Take 20 mg by mouth daily.    [provider]  predniSONE (DELTASONE) 20 MG tablet Prednisone 40 mg daily for 5 days followed by Prednisone 30 mg daily for 5 days followed by  Prednisone 20 mg daily for 5 days followed by  Prednisone 10 mg daily for 5 days followed by  Prednisone 10 mg daily every other day for 10 days. 09/26/16    Hosie Poisson, MD    Allergies    Erythromycin  Review of Systems   Review of Systems  Constitutional: Negative for chills, diaphoresis, fatigue and fever.  HENT: Positive for dental problem, facial swelling and sinus pain. Negative for congestion, drooling, ear discharge, mouth sores, nosebleeds, postnasal drip, rhinorrhea, sinus pressure, sneezing, sore throat, tinnitus, trouble swallowing and voice change.   Eyes: Positive for pain. Negative for photophobia, discharge, redness, itching and visual disturbance.  Respiratory: Negative for cough, shortness of breath and wheezing.   Cardiovascular: Negative for chest pain, palpitations and leg swelling.  Gastrointestinal: Negative for abdominal distention, abdominal pain, diarrhea, nausea and vomiting.  Genitourinary: Negative for difficulty urinating.  Musculoskeletal: Negative for back pain, neck pain and neck stiffness.  Skin: Negative for pallor.  Neurological: Negative for dizziness, speech difficulty, weakness and headaches.  Psychiatric/Behavioral: Negative for confusion.    Physical Exam Updated Vital Signs BP (!) 166/109 (BP Location: Left Arm)   Pulse 73   Temp 98.3 F (36.8 C) (Oral)   Resp 14   Ht 5\' 4"  (1.626 m)   Wt 76.7 kg   SpO2 100%   BMI 29.01 kg/m   Physical Exam Constitutional:      General: She is not in acute distress.    Appearance: Normal appearance. She is not ill-appearing, toxic-appearing or diaphoretic.     Comments: Patient without acute respiratory stress.  Patient is sitting comfortably in bed, no tripoding, use of accessory muscles.  Patient is speaking to me in full sentences.  Handling secretions well.  HENT:     Head: Normocephalic and atraumatic. Left periorbital erythema present. No right periorbital erythema.     Jaw: There is normal jaw occlusion. Tenderness and swelling present. No trismus or malocclusion.     Comments: There is moderate facial swelling on patient's left side, swelling  extends into the orbit. Periorbital tenderness along with erythema and edema noticed. Tenderness of the maxillary sinus as well on the left side. Right side normal.    Nose: No congestion or rhinorrhea.     Right Sinus: No maxillary sinus tenderness or frontal sinus tenderness.     Left Sinus: No maxillary sinus tenderness or frontal sinus tenderness.     Mouth/Throat:     Mouth: Mucous membranes are moist. No oral lesions.     Dentition: Abnormal dentition. Dental caries present. No dental tenderness, dental abscesses or gum lesions.     Tongue: No lesions.     Palate: No mass and lesions.     Pharynx: Oropharynx is clear. Uvula midline. No pharyngeal swelling, oropharyngeal exudate, posterior oropharyngeal erythema or  uvula swelling.     Tonsils: No tonsillar exudate or tonsillar abscesses. 1+ on the right. 1+ on the left.      Comments: Patient without tonsillar enlargement or exudate.  No signs of peritonsillar abscess, palate without any tenderness or masses palpated.  No swelling under the tongue, uvula is midline without any inflammation.  There are multiple dental caries throughout her mouth, patient states that she chipped her tooth at the location of the image above. No tenderness to inside of mouth.Tooth is chipped, however has multiple dental caries throughout each tooth. No abscess noted. Eyes:     General: No visual field deficit.       Right eye: No discharge.        Left eye: No discharge.     Extraocular Movements: Extraocular movements intact.     Conjunctiva/sclera: Conjunctivae normal.     Pupils: Pupils are equal, round, and reactive to light.     Comments: Patient states that she has some pain when she moves her left eye, normal EOMs.  States that the pain is more superficial than inside her eye.  No nystagmus or proptosis.  Mild edema around eye.  Vision is unaffected with central and peripheral testing.  Cardiovascular:     Rate and Rhythm: Normal rate and regular  rhythm.     Pulses: Normal pulses.     Heart sounds: Normal heart sounds. No murmur heard.  No friction rub. No gallop.   Pulmonary:     Effort: Pulmonary effort is normal. No respiratory distress.     Breath sounds: Normal breath sounds. No stridor. No wheezing, rhonchi or rales.  Chest:     Chest wall: No tenderness.  Abdominal:     General: Abdomen is flat. Bowel sounds are normal. There is no distension.     Palpations: Abdomen is soft.     Tenderness: There is no abdominal tenderness. There is no right CVA tenderness or left CVA tenderness.  Musculoskeletal:        General: No swelling or tenderness. Normal range of motion.     Cervical back: Normal range of motion. No rigidity or tenderness.     Right lower leg: No edema.     Left lower leg: No edema.  Lymphadenopathy:     Cervical: No cervical adenopathy.  Skin:    General: Skin is warm and dry.     Capillary Refill: Capillary refill takes less than 2 seconds.     Findings: No erythema or rash.  Neurological:     General: No focal deficit present.     Mental Status: She is alert and oriented to person, place, and time.     Cranial Nerves: Cranial nerves are intact. No cranial nerve deficit or facial asymmetry.     Motor: Motor function is intact. No weakness.     Coordination: Coordination is intact.     Gait: Gait is intact. Gait normal.  Psychiatric:        Mood and Affect: Mood normal.     ED Results / Procedures / Treatments   Labs (all labs ordered are listed, but only abnormal results are displayed) Labs Reviewed  COMPREHENSIVE METABOLIC PANEL - Abnormal; Notable for the following components:      Result Value   AST 13 (*)    All other components within normal limits  CBC    EKG None  Radiology CT Maxillofacial W Contrast  Result Date: 03/09/2020 CLINICAL DATA:  Left facial swelling.  Rule out orbital cellulitis. EXAM: CT MAXILLOFACIAL WITH CONTRAST TECHNIQUE: Multidetector CT imaging of the  maxillofacial structures was performed with intravenous contrast. Multiplanar CT image reconstructions were also generated. CONTRAST:  44mL OMNIPAQUE IOHEXOL 300 MG/ML  SOLN COMPARISON:  None. FINDINGS: Osseous: Poor dentition with numerous caries and periapical lucencies. Periapical lucency around left upper canine with cortical erosion and subperiosteal abscess. Orbits: Normal orbit bilaterally. No orbital abscess. There is soft tissue swelling which is mild in the left face and left lower eyelid. Sinuses: Mild mucosal edema paranasal sinuses. No air-fluid level. Mastoid clear bilaterally. Soft tissues: Diffuse soft tissue swelling of the left face lateral to the mandible and anterior to the maxilla. 8 mm subperiosteal abscess in the soft tissues anterior to the left upper canine tooth which has periapical abscess. Limited intracranial: Negative IMPRESSION: 1. Poor dentition with numerous caries. Periapical lucency around left upper canine with cortical erosion and adjacent 8 mm subperiosteal abscess. There is cellulitis and edema throughout the soft tissues of the left face. Electronically Signed   By: Franchot Gallo M.D.   On: 03/09/2020 15:51    Procedures Procedures (including critical care time)  Medications Ordered in ED Medications  acetaminophen (TYLENOL) tablet 650 mg (650 mg Oral Given 03/09/20 1424)  iohexol (OMNIPAQUE) 300 MG/ML solution 75 mL (75 mLs Intravenous Contrast Given 03/09/20 1527)    ED Course  I have reviewed the triage vital signs and the nursing notes.  Pertinent labs & imaging results that were available during my care of the patient were reviewed by me and considered in my medical decision making (see chart for details).    MDM Rules/Calculators/A&P                         GWENDOLYNE WELFORD is a 54 y.o. female with pertinent past medical history of CVA, heart murmur, hypertension that presents the emergency department today for left facial swelling. Patient with  periorbital swelling, painful EOMs, blurry vision I am concerned about orbital cellulitis. Will obtain CT maxillofacial to rule out.   CBC and CMP stable.  Visual acuity normal.  CT shows 8 mm subperiosteal abscess and facial edema with cellulitis and edema through the soft tissues of the left face.  Patient states that she cannot afford antibiotics, TOC consult placed.  Patient is discharged with voucher for medications of Augmentin and Bactrim covering for preseptal cellulitis and abscess at this time.  Patient strongly encouraged to see a dentist and to have close follow-up.  I expressed the importance of this.  Expressed if she has any other vision changes she needs to come back to the emergency department or see an ophthalmologist.  Patient states that pain in her eyes is most likely on the outside versus the inside.  Patient states that she will follow-up.  Doubt need for further emergent work up at this time. I explained the diagnosis and have given explicit precautions to return to the ER including for any other new or worsening symptoms. The patient understands and accepts the medical plan as it's been dictated and I have answered their questions. Discharge instructions concerning home care and prescriptions have been given. The patient is STABLE and is discharged to home in good condition.  Did speak to patient about increased blood pressure anything to talk to primary care about this as well.  Patient agreeable.  I discussed this case with my attending physician who cosigned this note including patient's presenting symptoms, physical  exam, and planned diagnostics and interventions. Attending physician stated agreement with plan or made changes to plan which were implemented.   Attending physician assessed patient at bedside.  Final Clinical Impression(s) / ED Diagnoses Final diagnoses:  Facial swelling  Dental abscess  Preseptal cellulitis of left eye    Rx / DC Orders ED Discharge  Orders    None       Alfredia Client, PA-C 03/09/20 1708    Maudie Flakes, MD 03/10/20 2037

## 2020-03-09 NOTE — ED Notes (Signed)
EDP at bedside  

## 2020-03-09 NOTE — ED Triage Notes (Signed)
Pt with left facial swelling since yesterday morning after waking up.  Has tried ice to face to help with swelling.  Recent broken tooth on left side.

## 2020-05-06 ENCOUNTER — Ambulatory Visit: Payer: Self-pay | Attending: Internal Medicine

## 2020-05-06 DIAGNOSIS — Z23 Encounter for immunization: Secondary | ICD-10-CM

## 2020-05-06 NOTE — Progress Notes (Signed)
   Covid-19 Vaccination Clinic  Name:  Meghan Simmons    MRN: [id number] DOB: 1966/04/21  05/06/2020  [name] was observed post Covid-19 immunization for 15 minutes without incident. She was provided with Vaccine Information Sheet and instruction to access the V-Safe system.   [name] was instructed to call 911 with any severe reactions post vaccine: Marland Kitchen Difficulty breathing  . Swelling of face and throat  . A fast heartbeat  . A bad rash all over body  . Dizziness and weakness   Immunizations Administered    Name Date Dose VIS Date Route   Pfizer COVID-19 Vaccine 05/06/2020  9:11 AM 0.3 mL 11/12/2018 Intramuscular   Manufacturer: [place]   Lot: [id number]   [place]: [address]-3

## 2020-05-27 ENCOUNTER — Ambulatory Visit: Payer: Self-pay | Attending: Internal Medicine

## 2020-05-27 DIAGNOSIS — Z23 Encounter for immunization: Secondary | ICD-10-CM

## 2020-05-27 NOTE — Progress Notes (Signed)
   Covid-19 Vaccination Clinic  Name:  Meghan Simmons    MRN: [id number] DOB: 1966-04-26  05/27/2020  [name] was observed post Covid-19 immunization for 15 minutes without incident. She was provided with Vaccine Information Sheet and instruction to access the V-Safe system.   [name] was instructed to call 911 with any severe reactions post vaccine: Marland Kitchen Difficulty breathing  . Swelling of face and throat  . A fast heartbeat  . A bad rash all over body  . Dizziness and weakness   Immunizations Administered    Name Date Dose VIS Date Route   Pfizer COVID-19 Vaccine 05/27/2020  9:25 AM 0.3 mL 11/12/2018 Intramuscular   Manufacturer: [employer]   Lot: [id number]   [place]: [address]-2

## 2020-12-30 ENCOUNTER — Telehealth: Payer: Self-pay

## 2020-12-30 NOTE — Telephone Encounter (Signed)
Meghan Simmons called inquiring about [facility] program and enrollment.  States she will possibly drop by today to enroll. Reviewed Documents needed for enrollment.   [name] RN [name]

## 2021-01-07 ENCOUNTER — Telehealth: Payer: Self-pay

## 2021-01-07 NOTE — Telephone Encounter (Signed)
Called client who recently enrolled in [place] to assist in setting up her appointment as a new patient to establish primary medical care with [facility]. Appointment made for 01/25/21 at 0930. Will arrange transportation for client with [facility] transportation for that date. She states she will need pick up from [place] that day. Explained that transportation will call the evening before to confirm pick up time. * Discussed cancellation policy of the free clinic with client. Will text all appointment information and contact information to client with her permission.   Discussed with client information about starting Mental Health services. Client was previously treated at [facility], but has not been there "for a long time". Client reports she was being treated for anxiety and depression at that time and was prescribed medications.  Discussed intake process is on a walk in basis Monday-Friday. Will arrange Transportation for client to go for intake on 01/13/21 at 0830 to [facility] in [place]. Client reports her pickup location that day will be [address] [place].  Client understands that she will let [facility] know if her pick up locations change at minimum a business day before appointment as client reports she is currently staying between two locations.    Plan: Establish primary Medical Care at [facility] : 01/1021 at [place] at [facility] : Intake 01/13/21 at 0830 Follow up with client after her appointments for further needs.  [name] RN [name]

## 2021-01-13 ENCOUNTER — Telehealth: Payer: Self-pay

## 2021-01-13 NOTE — Telephone Encounter (Signed)
Client called needing transportation for 01/18/21 at [place] to [facility] in [place].  [facility] transportation called and transportation arranged.   [place]

## 2021-01-18 ENCOUNTER — Telehealth: Payer: Self-pay

## 2021-01-18 NOTE — Telephone Encounter (Signed)
Return call to client. She has completed intake at [facility] and has an appointment with Dr. Hoyle Barr on 01/24/21 at 9:20 Am and is requesting transportation. Client states she was given a Arboriculturist today at [facility].  Reviewed with client she is already enrolled and approved for [place] [facility] until 12/17/21 and discussed how that works. Instructed client to call [facility] and let them know she had applied and is approved. Dates of approval texted to client with suggestion to ask [facility] to put that in her chart. Suggested client check her mail also for her approval letter and take that with her to the appointment on 01/24/21. Client made aware that her appointment with [facility] the provider would be able to see that she is enrolled with [facility]. Explained in detail that not all medications are available from [facility] but her providers would be able to view this on the program's website or by calling [facility].  Transportation arranged for 01/24/21 to [place] with [facility] transportation and pickup location [place].  [name] RN [name]

## 2021-01-25 ENCOUNTER — Other Ambulatory Visit: Payer: Self-pay

## 2021-01-25 ENCOUNTER — Other Ambulatory Visit: Payer: Self-pay | Admitting: Physician Assistant

## 2021-01-25 ENCOUNTER — Ambulatory Visit: Payer: Self-pay | Admitting: Physician Assistant

## 2021-01-25 ENCOUNTER — Encounter: Payer: Self-pay | Admitting: Physician Assistant

## 2021-01-25 VITALS — BP 162/104 | HR 60 | Temp 97.2°F | Ht 63.0 in | Wt 183.0 lb

## 2021-01-25 DIAGNOSIS — Z1239 Encounter for other screening for malignant neoplasm of breast: Secondary | ICD-10-CM

## 2021-01-25 DIAGNOSIS — Z1211 Encounter for screening for malignant neoplasm of colon: Secondary | ICD-10-CM

## 2021-01-25 DIAGNOSIS — Q211 Atrial septal defect, unspecified: Secondary | ICD-10-CM

## 2021-01-25 DIAGNOSIS — F489 Nonpsychotic mental disorder, unspecified: Secondary | ICD-10-CM

## 2021-01-25 DIAGNOSIS — I10 Essential (primary) hypertension: Secondary | ICD-10-CM

## 2021-01-25 DIAGNOSIS — Z131 Encounter for screening for diabetes mellitus: Secondary | ICD-10-CM

## 2021-01-25 DIAGNOSIS — Z833 Family history of diabetes mellitus: Secondary | ICD-10-CM

## 2021-01-25 DIAGNOSIS — Z13220 Encounter for screening for lipoid disorders: Secondary | ICD-10-CM

## 2021-01-25 DIAGNOSIS — E669 Obesity, unspecified: Secondary | ICD-10-CM

## 2021-01-25 DIAGNOSIS — F17200 Nicotine dependence, unspecified, uncomplicated: Secondary | ICD-10-CM

## 2021-01-25 DIAGNOSIS — Z7689 Persons encountering health services in other specified circumstances: Secondary | ICD-10-CM

## 2021-01-25 MED ORDER — LOSARTAN POTASSIUM 50 MG PO TABS
50.0000 mg | ORAL_TABLET | Freq: Every day | ORAL | 0 refills | Status: DC
Start: 2021-01-25 — End: 2021-04-11

## 2021-01-25 NOTE — Patient Instructions (Signed)
Refer to cardiology Refer for Screening mammogram Recommend Smoking cessation Colon cancer screening test You are put on Dental list Record request for PAP sent to health dept Update labs/get blood drawn [facility] financial assistance application Prescription for losartan for blood pressure sent to [facility]

## 2021-01-25 NOTE — Progress Notes (Signed)
BP (!) 162/104   Pulse 60   Temp (!) 97.2 F (36.2 C)   Ht 5\' 3"  (1.6 m)   Wt 183 lb (83 kg)   SpO2 98%   BMI 32.42 kg/m    Subjective:    Patient ID: Meghan Simmons, female    DOB: 08-Jun-1966, 55 y.o.   MRN: [id number]  HPI: Meghan Simmons is a 55 y.o. female presenting on 01/25/2021 for No chief complaint on file.   HPI  Pt had a negative covid 19 screening questionnaire.    She went to [facility] yesterday.  She hasn't taken it yet because it was ordered through the mail.  She doesn't know what meds were prescribed but thinks it is citalopram.  Pt used to go to [facility] but doesn't know if she has been since covid  Pt reports she had CVA in 2015 and was treated at [facility].  Notes in Epic from 2015 show hypertensive urgecy facial drrop ASD but no dx of CVA.   MRI done at the time did not reveal CVA.  Pt says she hasn't seen cardiology for a while.  In epic it appears she hasn't seen cardiology since 2016 Pt had cardiac CT in 2016 which  showed - ASD, possible CAD, R heart dilitation, and evidence of pulmonary HTN.  ASD closure was recommended at that time but she declined.   Pt smokes 1ppd  She says she is trying to get disability because she forgets a lot.   Pt is some times having problems with breathing but not all the time, moslty with going up and down steps.  Pt got 1st 2 covid shots but has not been boosted.   Relevant past medical, surgical, family and social history reviewed and updated as indicated. Interim medical history since our last visit reviewed. Allergies and medications reviewed and updated.   Current Outpatient Medications:  .  citalopram (CELEXA) 20 MG tablet, Take 10 mg by mouth daily. (Patient not taking: Reported on 01/25/2021), Disp: , Rfl:    Review of Systems  Per HPI unless specifically indicated above     Objective:    BP (!) 162/104   Pulse 60   Temp (!) 97.2 F (36.2 C)   Ht 5\' 3"  (1.6 m)   Wt 183 lb (83 kg)   SpO2 98%   BMI 32.42  kg/m   Wt Readings from Last 3 Encounters:  01/25/21 183 lb (83 kg)  03/09/20 169 lb (76.7 kg)  02/24/19 198 lb (89.8 kg)    Physical Exam Vitals reviewed.  Constitutional:      General: She is not in acute distress.    Appearance: She is well-developed. She is not toxic-appearing.  HENT:     Head: Normocephalic and atraumatic.     Right Ear: Tympanic membrane normal.     Left Ear: Tympanic membrane normal.  Eyes:     Extraocular Movements: Extraocular movements intact.     Conjunctiva/sclera: Conjunctivae normal.     Pupils: Pupils are equal, round, and reactive to light.  Neck:     Thyroid: No thyromegaly.  Cardiovascular:     Rate and Rhythm: Normal rate and regular rhythm.     Pulses:          Radial pulses are 2+ on the right side and 2+ on the left side.     Heart sounds: Murmur heard.    Pulmonary:     Effort: Pulmonary effort is normal.  Breath sounds: Normal breath sounds.  Abdominal:     General: Bowel sounds are normal.     Palpations: Abdomen is soft. There is no mass.     Tenderness: There is no abdominal tenderness.  Musculoskeletal:     Cervical back: Neck supple.     Right lower leg: No edema.     Left lower leg: No edema.  Lymphadenopathy:     Cervical: No cervical adenopathy.  Skin:    General: Skin is warm and dry.  Neurological:     Mental Status: She is alert and oriented to person, place, and time.     Motor: No weakness or tremor.     Gait: Gait is intact. Gait normal.     Deep Tendon Reflexes:     Reflex Scores:      Patellar reflexes are 2+ on the right side and 2+ on the left side. Psychiatric:        Attention and Perception: Attention normal.        Speech: Speech normal.        Behavior: Behavior normal. Behavior is cooperative.              Assessment & Plan:    Encounter Diagnoses  Name Primary?  . Encounter to establish care Yes  . Primary hypertension   . ASD (atrial septal defect)   . Tobacco use disorder    . Obesity, unspecified classification, unspecified obesity type, unspecified whether serious comorbidity present   . Mental health problem   . Encounter for screening for malignant neoplasm of breast, unspecified screening modality   . Screening for colon cancer   . Screening cholesterol level   . Screening for diabetes mellitus   . Family history of diabetes mellitus      -pt is started on losartan for HTN -Refer to cardiology for management of ASD and associated issues -refer for Screening mammogram -encouraged Smoking cessation -pt was given FIT test for colon cancer screening  -pt is put on Dental list per request -Record request sent to [facility] for most recent PAP -will Update labs -pt is given application for [facility] financial assistance -pt to continue with [facility] for [place] issues -pt to follow up 1 month to recheck bp, review labs, etc.  She is to contact office sooner prn

## 2021-01-27 ENCOUNTER — Encounter: Payer: Self-pay | Admitting: Physician Assistant

## 2021-01-27 ENCOUNTER — Telehealth: Payer: Self-pay

## 2021-01-27 NOTE — Telephone Encounter (Signed)
Attempted to call client for follow up after [facility] appointment and review recommendations of provider. No answer and no voicemail set up. Did refer client to [facility] Forensic psychologist for [employer].   [name] RN [name]

## 2021-01-31 ENCOUNTER — Other Ambulatory Visit: Payer: Self-pay

## 2021-01-31 ENCOUNTER — Other Ambulatory Visit (HOSPITAL_COMMUNITY)
Admission: RE | Admit: 2021-01-31 | Discharge: 2021-01-31 | Disposition: A | Discharge status: Home / Self Care | Payer: Self-pay | Source: Ambulatory Visit | Attending: Physician Assistant | Admitting: Physician Assistant

## 2021-01-31 DIAGNOSIS — Z13220 Encounter for screening for lipoid disorders: Secondary | ICD-10-CM | POA: Insufficient documentation

## 2021-01-31 DIAGNOSIS — Z131 Encounter for screening for diabetes mellitus: Secondary | ICD-10-CM | POA: Insufficient documentation

## 2021-01-31 DIAGNOSIS — Z833 Family history of diabetes mellitus: Secondary | ICD-10-CM | POA: Insufficient documentation

## 2021-01-31 DIAGNOSIS — I10 Essential (primary) hypertension: Secondary | ICD-10-CM | POA: Insufficient documentation

## 2021-01-31 LAB — COMPREHENSIVE METABOLIC PANEL
ALT: 11 U/L (ref 0–44)
AST: 15 U/L (ref 15–41)
Albumin: 3.9 g/dL (ref 3.5–5.0)
Alkaline Phosphatase: 85 U/L (ref 38–126)
Anion gap: 6 (ref 5–15)
BUN: 8 mg/dL (ref 6–20)
CO2: 27 mmol/L (ref 22–32)
Calcium: 8.9 mg/dL (ref 8.9–10.3)
Chloride: 104 mmol/L (ref 98–111)
Creatinine, Ser: 0.91 mg/dL (ref 0.44–1.00)
GFR, Estimated: >60 mL/min (ref >60)
Glucose, Bld: 96 mg/dL (ref 70–99)
Potassium: 4 mmol/L (ref 3.5–5.1)
Sodium: 137 mmol/L (ref 135–145)
Total Bilirubin: 0.7 mg/dL (ref 0.3–1.2)
Total Protein: 6.9 g/dL (ref 6.5–8.1)

## 2021-01-31 LAB — HEMOGLOBIN A1C
Hgb A1c MFr Bld: 5.5 % (ref 4.8–5.6)
Mean Plasma Glucose: 111.15 mg/dL

## 2021-01-31 LAB — IFOBT (OCCULT BLOOD): IFOBT: NEGATIVE

## 2021-01-31 LAB — LIPID PANEL
Cholesterol: 225 mg/dL — ABNORMAL HIGH (ref 0–200)
HDL: 55 mg/dL (ref >40)
LDL Cholesterol: 148 mg/dL — ABNORMAL HIGH (ref 0–99)
Total CHOL/HDL Ratio: 4.1 RATIO
Triglycerides: 110 mg/dL (ref <150)
VLDL: 22 mg/dL (ref 0–40)

## 2021-02-09 ENCOUNTER — Ambulatory Visit (HOSPITAL_COMMUNITY)
Admission: RE | Admit: 2021-02-09 | Discharge: 2021-02-09 | Disposition: A | Discharge status: Home / Self Care | Payer: Self-pay | Source: Ambulatory Visit | Attending: Physician Assistant | Admitting: Physician Assistant

## 2021-02-09 ENCOUNTER — Other Ambulatory Visit: Payer: Self-pay

## 2021-02-09 DIAGNOSIS — Z1239 Encounter for other screening for malignant neoplasm of breast: Secondary | ICD-10-CM | POA: Insufficient documentation

## 2021-02-16 ENCOUNTER — Telehealth: Payer: Self-pay | Admitting: Physician Assistant

## 2021-02-16 NOTE — Telephone Encounter (Signed)
Patient called in stating she was irritable,having diarrhea,she is hot and then cold, she is losing stuff, and is having headaches. She was concerned that [facility] changing her blood pressure medication at her new patient appointment   was causing her symptoms. [facility] also increased her citalopram at her appointment with them. After talking with [place] patient was advised that her Losartan should not be causing her symptoms and patient was advised to call [facility] to discuss her symptoms with them.

## 2021-03-01 ENCOUNTER — Other Ambulatory Visit: Payer: Self-pay

## 2021-03-01 ENCOUNTER — Ambulatory Visit: Payer: Self-pay | Admitting: Physician Assistant

## 2021-03-01 ENCOUNTER — Encounter: Payer: Self-pay | Admitting: Physician Assistant

## 2021-03-01 VITALS — BP 141/98 | HR 65 | Temp 98.0°F | Wt 182.0 lb

## 2021-03-01 DIAGNOSIS — F17200 Nicotine dependence, unspecified, uncomplicated: Secondary | ICD-10-CM

## 2021-03-01 DIAGNOSIS — E669 Obesity, unspecified: Secondary | ICD-10-CM

## 2021-03-01 DIAGNOSIS — F489 Nonpsychotic mental disorder, unspecified: Secondary | ICD-10-CM

## 2021-03-01 DIAGNOSIS — Q211 Atrial septal defect, unspecified: Secondary | ICD-10-CM

## 2021-03-01 DIAGNOSIS — I10 Essential (primary) hypertension: Secondary | ICD-10-CM

## 2021-03-01 DIAGNOSIS — E785 Hyperlipidemia, unspecified: Secondary | ICD-10-CM

## 2021-03-01 MED ORDER — ATORVASTATIN CALCIUM 20 MG PO TABS: 20.0000 mg | ORAL_TABLET | Freq: Every day | ORAL | 0 refills | Status: DC

## 2021-03-01 NOTE — Patient Instructions (Signed)
High Cholesterol  High cholesterol is a condition in which the blood has high levels of a white, waxy substance similar to fat (cholesterol). The liver makes all the cholesterol that the body needs. The human body needs small amounts of cholesterol to help build cells. A person gets extra orexcess cholesterol from the food that he or she eats. The blood carries cholesterol from the liver to the rest of the body. If you have high cholesterol, deposits (plaques) may build up on the walls of your arteries. Arteries are the blood vessels that carry blood away from your heart. These plaques make the arteries narrowand stiff. Cholesterol plaques increase your risk for heart attack and stroke. Work withyour health care provider to keep your cholesterol levels in a healthy range. What increases the risk? The following factors may make you more likely to develop this condition: Eating foods that are high in animal fat (saturated fat) or cholesterol. Being overweight. Not getting enough exercise. A family history of high cholesterol (familial hypercholesterolemia). Use of tobacco products. Having diabetes. What are the signs or symptoms? There are no symptoms of this condition. How is this diagnosed? This condition may be diagnosed based on the results of a blood test. If you are older than 53 years of age, your health care provider may check your cholesterol levels every 4-6 years. You may be checked more often if you have high cholesterol or other risk factors for heart disease. The blood test for cholesterol measures: "Bad" cholesterol, or LDL cholesterol. This is the main type of cholesterol that causes heart disease. The desired level is less than 100 mg/dL. "Good" cholesterol, or HDL cholesterol. HDL helps protect against heart disease by cleaning the arteries and carrying the LDL to the liver for processing. The desired level for HDL is 60 mg/dL or higher. Triglycerides. These are fats that your  body can store or burn for energy. The desired level is less than 150 mg/dL. Total cholesterol. This measures the total amount of cholesterol in your blood and includes LDL, HDL, and triglycerides. The desired level is less than 200 mg/dL. How is this treated? This condition may be treated with: Diet changes. You may be asked to eat foods that have more fiber and less saturated fats or added sugar. Lifestyle changes. These may include regular exercise, maintaining a healthy weight, and quitting use of tobacco products. Medicines. These are given when diet and lifestyle changes have not worked. You may be prescribed a statin medicine to help lower your cholesterol levels. Follow these instructions at home: Eating and drinking  Eat a healthy, balanced diet. This diet includes: Daily servings of a variety of fresh, frozen, or canned fruits and vegetables. Daily servings of whole grain foods that are rich in fiber. Foods that are low in saturated fats and trans fats. These include poultry and fish without skin, lean cuts of meat, and low-fat dairy products. A variety of fish, especially oily fish that contain omega-3 fatty acids. Aim to eat fish at least 2 times a week. Avoid foods and drinks that have added sugar. Use healthy cooking methods, such as roasting, grilling, broiling, baking, poaching, steaming, and stir-frying. Do not fry your food except for stir-frying.  Lifestyle  Get regular exercise. Aim to exercise for a total of 150 minutes a week. Increase your activity level by doing activities such as gardening, walking, and taking the stairs. Do not use any products that contain nicotine or tobacco, such as cigarettes, e-cigarettes, and chewing tobacco.   If you need help quitting, ask your health care provider.  General instructions Take over-the-counter and prescription medicines only as told by your health care provider. Keep all follow-up visits as told by your health care provider.  This is important. Where to find more information American Heart Association: www.heart.org National Heart, Lung, and Blood Institute: www.nhlbi.nih.gov Contact a health care provider if: You have trouble achieving or maintaining a healthy diet or weight. You are starting an exercise program. You are unable to stop smoking. Get help right away if: You have chest pain. You have trouble breathing. You have any symptoms of a stroke. "BE FAST" is an easy way to remember the main warning signs of a stroke: B - Balance. Signs are dizziness, sudden trouble walking, or loss of balance. E - Eyes. Signs are trouble seeing or a sudden change in vision. F - Face. Signs are sudden weakness or numbness of the face, or the face or eyelid drooping on one side. A - Arms. Signs are weakness or numbness in an arm. This happens suddenly and usually on one side of the body. S - Speech. Signs are sudden trouble speaking, slurred speech, or trouble understanding what people say. T - Time. Time to call emergency services. Write down what time symptoms started. You have other signs of a stroke, such as: A sudden, severe headache with no known cause. Nausea or vomiting. Seizure. These symptoms may represent a serious problem that is an emergency. Do not wait to see if the symptoms will go away. Get medical help right away. Call your local emergency services (911 in the [place] Do not drive yourself to the hospital. Summary Cholesterol plaques increase your risk for heart attack and stroke. Work with your health care provider to keep your cholesterol levels in a healthy range. Eat a healthy, balanced diet, get regular exercise, and maintain a healthy weight. Do not use any products that contain nicotine or tobacco, such as cigarettes, e-cigarettes, and chewing tobacco. Get help right away if you have any symptoms of a stroke. This information is not intended to replace advice given to you by your health care  provider. Make sure you discuss any questions you have with your healthcare provider. Document Revised: 08/04/2019 Document Reviewed: 08/04/2019 Elsevier Patient Education  2022 Elsevier Inc.  

## 2021-03-01 NOTE — Progress Notes (Signed)
BP (!) 141/98   Pulse 65   Temp 98 F (36.7 C)   Wt 182 lb (82.6 kg)   SpO2 96%   BMI 32.24 kg/m    Subjective:    Patient ID: Meghan Simmons, female    DOB: 04-24-1966, 55 y.o.   MRN: [id number]  HPI: Meghan Simmons is a 55 y.o. female presenting on 03/01/2021 for Hypertension   HPI    Pt had a negative covid 19 screening questionnaire.    Pt is 76yoF who presents for follow up HTN and to review labs.   Pt has not been taking her losartan.   She was concerned about her meds causing her to be irritable and have headaches.  She was encouraged to contact her [place] provider as these are more likely due to citalopram but she has not spoke with them yet.      Relevant past medical, surgical, family and social history reviewed and updated as indicated. Interim medical history since our last visit reviewed. Allergies and medications reviewed and updated.    Current Outpatient Medications:    citalopram (CELEXA) 20 MG tablet, Take 10 mg by mouth daily. (Patient not taking: Reported on 03/01/2021), Disp: , Rfl:    losartan (COZAAR) 50 MG tablet, Take 1 tablet (50 mg total) by mouth daily. (Patient not taking: Reported on 03/01/2021), Disp: 90 tablet, Rfl: 0     Review of Systems  Per HPI unless specifically indicated above     Objective:    BP (!) 141/98   Pulse 65   Temp 98 F (36.7 C)   Wt 182 lb (82.6 kg)   SpO2 96%   BMI 32.24 kg/m   Wt Readings from Last 3 Encounters:  03/01/21 182 lb (82.6 kg)  01/25/21 183 lb (83 kg)  03/09/20 169 lb (76.7 kg)    Physical Exam Vitals reviewed.  Constitutional:      General: She is not in acute distress.    Appearance: She is well-developed. She is not ill-appearing.  HENT:     Head: Normocephalic and atraumatic.  Cardiovascular:     Rate and Rhythm: Normal rate and regular rhythm.  Pulmonary:     Effort: Pulmonary effort is normal.     Breath sounds: Normal breath sounds.  Abdominal:     General: Bowel sounds are  normal.     Palpations: Abdomen is soft. There is no mass.     Tenderness: There is no abdominal tenderness.  Musculoskeletal:     Cervical back: Neck supple.     Right lower leg: No edema.     Left lower leg: No edema.  Lymphadenopathy:     Cervical: No cervical adenopathy.  Skin:    General: Skin is warm and dry.  Neurological:     Mental Status: She is alert and oriented to person, place, and time.  Psychiatric:        Behavior: Behavior normal.    Results for orders placed or performed during the hospital encounter of 01/31/21  Hemoglobin A1c  Result Value Ref Range   Hgb A1c MFr Bld 5.5 4.8 - 5.6 %   Mean Plasma Glucose 111.15 mg/dL  Comprehensive metabolic panel  Result Value Ref Range   Sodium 137 135 - 145 mmol/L   Potassium 4.0 3.5 - 5.1 mmol/L   Chloride 104 98 - 111 mmol/L   CO2 27 22 - 32 mmol/L   Glucose, Bld 96 70 - 99 mg/dL   BUN  8 6 - 20 mg/dL   Creatinine, Ser 0.91 0.44 - 1.00 mg/dL   Calcium 8.9 8.9 - 10.3 mg/dL   Total Protein 6.9 6.5 - 8.1 g/dL   Albumin 3.9 3.5 - 5.0 g/dL   AST 15 15 - 41 U/L   ALT 11 0 - 44 U/L   Alkaline Phosphatase 85 38 - 126 U/L   Total Bilirubin 0.7 0.3 - 1.2 mg/dL   GFR, Estimated >60 >60 mL/min   Anion gap 6 5 - 15  Lipid panel  Result Value Ref Range   Cholesterol 225 (H) 0 - 200 mg/dL   Triglycerides 110 <150 mg/dL   HDL 55 >40 mg/dL   Total CHOL/HDL Ratio 4.1 RATIO   VLDL 22 0 - 40 mg/dL   LDL Cholesterol 148 (H) 0 - 99 mg/dL          Assessment & Plan:    Encounter Diagnoses  Name Primary?   Primary hypertension Yes   Hyperlipidemia, unspecified hyperlipidemia type    Mental health problem    Tobacco use disorder    ASD (atrial septal defect)    Obesity, unspecified classification, unspecified obesity type, unspecified whether serious comorbidity present       -reviewed labs with pt  -pt counseled to Restart losartan -will Add lipitor for cholesterol.  Counseled on lowfat diet and gave reading  information -pt encouraged to contact [facility] again about her medication -PAP, mammogram and colon cancer screening up-to-date -pt to see cardiologist in July as scheduled -encouraged smoking cessation -pt to follow up 3 months.  She is to contact office sooner prn

## 2021-04-04 ENCOUNTER — Other Ambulatory Visit: Payer: Self-pay

## 2021-04-04 ENCOUNTER — Encounter: Payer: Self-pay | Admitting: Internal Medicine

## 2021-04-04 ENCOUNTER — Ambulatory Visit (INDEPENDENT_AMBULATORY_CARE_PROVIDER_SITE_OTHER): Payer: Self-pay | Admitting: Internal Medicine

## 2021-04-04 VITALS — BP 126/84 | HR 64 | Wt 180.0 lb

## 2021-04-04 DIAGNOSIS — Q211 Atrial septal defect, unspecified: Secondary | ICD-10-CM

## 2021-04-04 NOTE — Patient Instructions (Signed)
Medication Instructions:  Your physician recommends that you continue on your current medications as directed. Please refer to the Current Medication list given to you today.  *If you need a refill on your cardiac medications before your next appointment, please call your pharmacy*   Lab Work: None If you have labs (blood work) drawn today and your tests are completely normal, you will receive your results only by: [place] (if you have MyChart) OR A paper copy in the mail If you have any lab test that is abnormal or we need to change your treatment, we will call you to review the results.   Testing/Procedures: None   Follow-Up: At [facility], you and your health needs are our priority.  As part of our continuing mission to provide you with exceptional heart care, we have created designated Provider Care Teams.  These Care Teams include your primary Cardiologist (physician) and Advanced Practice Providers (APPs -  Physician Assistants and Nurse Practitioners) who all work together to provide you with the care you need, when you need it.  We recommend signing up for the patient portal called "MyChart".  Sign up information is provided on this After Visit Summary.  MyChart is used to connect with patients for Virtual Visits (Telemedicine).  Patients are able to view lab/test results, encounter notes, upcoming appointments, etc.  Non-urgent messages can be sent to your provider as well.   To learn more about what you can do with MyChart, go to [email or web address].    Your next appointment:   F/U: PENDING   Other Instructions

## 2021-04-04 NOTE — Progress Notes (Signed)
Cardiology Office Note   Date:  04/04/2021   ID:  Meghan Simmons, DOB [birth year], MRN [id number]  PCP:  Soyla Dryer, PA-C  Cardiologist:   Dorris Carnes, MD   Pt presents for f/u of ASD    History of Present Illness: Meghan Simmons is a 55 y.o. female with a history of  HTN and a large secundum ASD  Echo in 2015 LVEF normal  RV dilated    ASD medium sized with L to R flow  PAP 46   TEE also done showing large ASD with RV dilation/dysfunction.   Suspicion for small VSD I saw her last in 2016 Cardiac CT done that showed CAD:  LAD mild; LCx small  Mod plaque, RCA with possible significant stenosis   Large ASD (27 x 29 mm) with sufficient rim on all edges except anterior.   Normal PV drainage.  No VSD noted  RV and PA enlargement noted  The pt was set up to see [name] to review studies, discuss options.   She declned this, even after discussion of risks for not evaluating/treating  The pt returns today for follow up   She says over the past few years she has had frequent skipping   Lasts about 5 min   Makes her cough    Some dizziness   No syncope  After  spells she says she will feel OK    The pt also notes that she  has had rare dizziness when heart is not racing   No synope    No PND     She notes rare chest pressure      Overall not too active    Washes dishes, sweeps/mops floor  Still smoking    1/2pp    Current Meds  Medication Sig   Acetaminophen 500 MG capsule Take by mouth every 6 (six) hours as needed for fever.   citalopram (CELEXA) 20 MG tablet Take 10 mg by mouth daily.   losartan (COZAAR) 50 MG tablet Take 1 tablet (50 mg total) by mouth daily.     Allergies:   Erythromycin   Past Medical History:  Diagnosis Date   Atrial septal defect    Heart murmur    Hypertension     Past Surgical History:  Procedure Laterality Date   CHOLECYSTECTOMY     TEE WITHOUT CARDIOVERSION N/A 11/27/2014   Procedure: TRANSESOPHAGEAL ECHOCARDIOGRAM (TEE);  Surgeon: [name],  MD;  Location: [facility] ENDOSCOPY;  Service: Cardiovascular;  Laterality: N/A;     Social History:  The patient  reports that she has been smoking cigarettes. She started smoking about 14 years ago. She has been smoking an average of 1 pack per day. She has never used smokeless tobacco. She reports previous alcohol use. She reports previous drug use. Drugs: Marijuana and "Crack" cocaine.   Family History:  The patient's family history includes Depression in her mother; Diabetes in her mother; Heart murmur in her mother; Hypertension in her mother; Kidney failure (age of onset: 61) in her father.    ROS:  Please see the history of present illness. All other systems are reviewed and  Negative to the above problem except as noted.    PHYSICAL EXAM: VS:  BP 126/84 (BP Location: Right Arm)   Pulse 64   Wt 180 lb (81.6 kg)   SpO2 98%   BMI 31.89 kg/m   GEN: Well nourished, well developed, in no acute distress  HEENT: normal  Neck: no JVD, carotid bruits, or masses Cardiac: RRR; no murmurs  Prominent P2  NO LE  edema  Respiratory:  clear to auscultation bilaterally GI: soft, nontender, nondistended, + BS  No hepatomegaly  MS: no deformity Moving all extremities   Skin: warm and dry, no rash Neuro:  Strength and sensation are intact Psych: euthymic mood, full affect   EKG:  EKG is ordered today.  SB 58 bpm  In[place]ole RBBB     Lipid Panel    Component Value Date/Time   CHOL 225 (H) 01/31/2021 0840   TRIG 110 01/31/2021 0840   HDL 55 01/31/2021 0840   CHOLHDL 4.1 01/31/2021 0840   VLDL 22 01/31/2021 0840   LDLCALC 148 (H) 01/31/2021 0840      Wt Readings from Last 3 Encounters:  04/04/21 180 lb (81.6 kg)  03/01/21 182 lb (82.6 kg)  01/25/21 183 lb (83 kg)      ASSESSMENT AND PLAN:    ASD   Pt with large secundum ASD   This was evaluated back in 2016   At that time she declined further evaluation   Risks for not treatinrg discussed   She said she understood   SOme of concern was  financial She did not return for f/u  She returns to clinic  today with complaints of skipping of her heart   Has been going on for awhile   Spells do not appear to be hemodynamically destabilizing  ? What they represent   Overall she is not too active      No presyncope/ syncope   No edema  Will review with finance offic  See first about getting an echo to reeval R sided chambers      Will also review CT scan from 2016  May need to repeat   Alos consider Zio patch   2   CAD   Noted on CT scan       The RCA was difficult to image, not clearly flow limiting    3   HTN  BP controlled  4  HL  Pt is on lipitor  Lasat LDL was 148 in May 2022   Will need reeval         Current medicines are reviewed at length with the patient today.  The patient does not have concerns regarding medicines.  Signed, Dorris Carnes, MD  04/04/2021 11:24 AM    [address], [place], [place]  [address] Phone: [phone]; Fax: [phone]

## 2021-04-11 ENCOUNTER — Other Ambulatory Visit: Payer: Self-pay | Admitting: Physician Assistant

## 2021-04-11 MED ORDER — LOSARTAN POTASSIUM 50 MG PO TABS: 50.0000 mg | ORAL_TABLET | Freq: Every day | ORAL | 1 refills | Status: DC

## 2021-04-11 MED ORDER — ATORVASTATIN CALCIUM 20 MG PO TABS: 20.0000 mg | ORAL_TABLET | Freq: Every day | ORAL | 1 refills | Status: DC

## 2021-04-12 ENCOUNTER — Telehealth: Payer: Self-pay

## 2021-04-12 NOTE — Telephone Encounter (Signed)
Client notified that [facility] provider sent a month prescription for Atorvastatin as well as Losartan to [place] and I have faxed a voucher for a one time financial assistance from [facility]. I have also asked the pharmacy to deliver to [place] per client.  Client instructed if she were to have any other problems with her medications to call [facility] for her cholesterol medication= Atorvastatin and Losartan =blood pressure medication. If she has difficulty with her Citalopram to contact her Mental Health provider who prescribes that medications. Client and RN have notified [facility] of new address to send medications as client states the medications sent by [facility] in June and also resent in July to [facility] address that client reports she did not get. Confirmed by client with post office as well as [facility] tracking that medications were delivered. Client is unable to call for Refills until August 1st. Client encouraged to call [facility] to reorder at that time.  Client is aware of her follow up appointment with [facility] in Sept 14.   Will follow as needed. Client is to contact this RN if her medications are not received for guidance. Discussed with client to not run out of her medications or if she has problems with receiving them to contact first her Medical providers and also [facility] for assistance. Client reports understanding.  [place] [employer]

## 2021-04-13 ENCOUNTER — Telehealth: Payer: Self-pay

## 2021-04-13 NOTE — Telephone Encounter (Signed)
Attempted to call client to follow up regarding delivery of medications from [place] 04/12/21. No answer, left Voicemail, requested return call.   [place] [employer]

## 2021-04-22 ENCOUNTER — Emergency Department (HOSPITAL_COMMUNITY)
Admission: EM | Admit: 2021-04-22 | Discharge: 2021-04-22 | Disposition: A | Discharge status: Home / Self Care | Payer: Self-pay | Attending: Emergency Medicine | Admitting: Emergency Medicine

## 2021-04-22 ENCOUNTER — Other Ambulatory Visit: Payer: Self-pay

## 2021-04-22 ENCOUNTER — Encounter (HOSPITAL_COMMUNITY): Payer: Self-pay

## 2021-04-22 ENCOUNTER — Emergency Department (HOSPITAL_COMMUNITY): Payer: Self-pay

## 2021-04-22 DIAGNOSIS — I160 Hypertensive urgency: Secondary | ICD-10-CM | POA: Insufficient documentation

## 2021-04-22 DIAGNOSIS — M549 Dorsalgia, unspecified: Secondary | ICD-10-CM | POA: Insufficient documentation

## 2021-04-22 DIAGNOSIS — R079 Chest pain, unspecified: Secondary | ICD-10-CM

## 2021-04-22 DIAGNOSIS — Z79899 Other long term (current) drug therapy: Secondary | ICD-10-CM | POA: Insufficient documentation

## 2021-04-22 DIAGNOSIS — F17210 Nicotine dependence, cigarettes, uncomplicated: Secondary | ICD-10-CM | POA: Insufficient documentation

## 2021-04-22 DIAGNOSIS — M79602 Pain in left arm: Secondary | ICD-10-CM | POA: Insufficient documentation

## 2021-04-22 DIAGNOSIS — R0789 Other chest pain: Secondary | ICD-10-CM | POA: Insufficient documentation

## 2021-04-22 DIAGNOSIS — I10 Essential (primary) hypertension: Secondary | ICD-10-CM | POA: Insufficient documentation

## 2021-04-22 LAB — BASIC METABOLIC PANEL
Anion gap: 4 — ABNORMAL LOW (ref 5–15)
BUN: 13 mg/dL (ref 6–20)
CO2: 27 mmol/L (ref 22–32)
Calcium: 8.7 mg/dL — ABNORMAL LOW (ref 8.9–10.3)
Chloride: 104 mmol/L (ref 98–111)
Creatinine, Ser: 0.84 mg/dL (ref 0.44–1.00)
GFR, Estimated: >60 mL/min (ref >60)
Glucose, Bld: 93 mg/dL (ref 70–99)
Potassium: 4.1 mmol/L (ref 3.5–5.1)
Sodium: 135 mmol/L (ref 135–145)

## 2021-04-22 LAB — D-DIMER, QUANTITATIVE: D-Dimer, Quant: 0.59 ug/mL-FEU — ABNORMAL HIGH (ref 0.00–0.50)

## 2021-04-22 LAB — CBC
HCT: 44.5 % (ref 36.0–46.0)
Hemoglobin: 14.6 g/dL (ref 12.0–15.0)
MCH: 30.2 pg (ref 26.0–34.0)
MCHC: 32.8 g/dL (ref 30.0–36.0)
MCV: 91.9 fL (ref 80.0–100.0)
Platelets: 182 10*3/uL (ref 150–400)
RBC: 4.84 MIL/uL (ref 3.87–5.11)
RDW: 13.7 % (ref 11.5–15.5)
WBC: 6.5 10*3/uL (ref 4.0–10.5)
nRBC: 0 % (ref 0.0–0.2)

## 2021-04-22 LAB — BRAIN NATRIURETIC PEPTIDE: B Natriuretic Peptide: 47 pg/mL (ref 0.0–100.0)

## 2021-04-22 LAB — TROPONIN I (HIGH SENSITIVITY)
Troponin I (High Sensitivity): 3 ng/L (ref <18)
Troponin I (High Sensitivity): 4 ng/L (ref <18)

## 2021-04-22 MED ORDER — SODIUM CHLORIDE 0.9% FLUSH: 3.0000 mL | Freq: Once | INTRAVENOUS | Status: DC

## 2021-04-22 MED ORDER — IBUPROFEN 600 MG PO TABS: 600.0000 mg | ORAL_TABLET | Freq: Three times a day (TID) | ORAL | 0 refills | Status: AC

## 2021-04-22 MED ORDER — LIDOCAINE 5 % EX PTCH: 1.0000 | MEDICATED_PATCH | CUTANEOUS | 0 refills | Status: DC

## 2021-04-22 NOTE — ED Triage Notes (Signed)
Ems reports pt c/o left chest pain radiating around shoulder and down left arm x 2 weeks.  Reports vss.  Bp 144/88.  Reports 12 lead was normal.  HR 60-70's.  Reports o2 sat 99% on room air.

## 2021-04-22 NOTE — ED Provider Notes (Signed)
[facility] Provider Note   CSN: [id number] Arrival date & time: 04/22/21  1045     History Chief Complaint  Patient presents with   Chest Pain    Meghan Simmons is a 55 y.o. female.  Patient is a 55 year old female with past medical history as listed below presenting for complaints of chest pain.  Patient admits to left chest wall pain with radiation to the left shoulder blade and down the left arm remittent, denies  exacerbating or alleviating factors, x2 weeks.  Denies any shortness of breath, fevers, chills, or coughing.  The history is provided by the patient. No language interpreter was used.  Chest Pain Pain location:  L chest Pain quality: aching   Pain radiates to:  Upper back and L arm Pain severity:  Mild Associated symptoms: back pain   Associated symptoms: no abdominal pain, no cough, no fever, no palpitations, no shortness of breath and no vomiting       Past Medical History:  Diagnosis Date   Atrial septal defect    Heart murmur    Hypertension     Patient Active Problem List   Diagnosis Date Noted   Sepsis ([place]) 09/17/2016   Rash 09/17/2016   Malnutrition ([place]) 09/17/2016   Cellulitis 09/13/2016   Scabies exposure 09/13/2016   ASD (atrial septal defect) 07/25/2014   Hypertensive urgency 07/24/2014   Facial droop 07/24/2014   Tobacco use disorder 07/24/2014    Past Surgical History:  Procedure Laterality Date   CHOLECYSTECTOMY     TEE WITHOUT CARDIOVERSION N/A 11/27/2014   Procedure: TRANSESOPHAGEAL ECHOCARDIOGRAM (TEE);  Surgeon: [name], MD;  Location: [facility] ENDOSCOPY;  Service: Cardiovascular;  Laterality: N/A;     OB History     Gravida  1   Para  1   Term  1   Preterm      AB      Living  1      SAB      IAB      Ectopic      Multiple      Live Births              Family History  Problem Relation Age of Onset   Diabetes Mother    Heart murmur Mother    Hypertension Mother    Depression  Mother    Kidney failure Father 50       dies while at dialysis    Social History   Tobacco Use   Smoking status: Every Day    Packs/day: 1.00    Types: Cigarettes    Start date: 2008   Smokeless tobacco: Never  Vaping Use   Vaping Use: Never used  Substance Use Topics   Alcohol use: Not Currently    Alcohol/week: 0.0 standard drinks    Comment: hx heavy etoh.  nonce since 2015   Drug use: Not Currently    Types: Marijuana, "Crack" cocaine    Comment: none since age 56.  crack only once at age 64    Home Medications Prior to Admission medications   Medication Sig Start Date End Date Taking? Authorizing Provider  ibuprofen (ADVIL) 600 MG tablet Take 1 tablet (600 mg total) by mouth every 8 (eight) hours for 4 days. 04/22/21 0000000 Yes Campbell Stall P, DO  lidocaine (LIDODERM) 5 % Place 1 patch onto the skin daily. Remove & Discard patch within 12 hours or as directed by MD 04/22/21  Yes Pearline Cables,  Offie Pickron P, DO  Acetaminophen 500 MG capsule Take by mouth every 6 (six) hours as needed for fever.    [provider]  atorvastatin (LIPITOR) 20 MG tablet Take 1 tablet (20 mg total) by mouth daily. 04/11/21   Soyla Dryer, PA-C  citalopram (CELEXA) 20 MG tablet Take 10 mg by mouth daily.    [provider]  losartan (COZAAR) 50 MG tablet TAKE 1 Tablet BY MOUTH ONCE EVERY DAY 04/11/21   Soyla Dryer, PA-C  losartan (COZAAR) 50 MG tablet Take 1 tablet (50 mg total) by mouth daily. 04/11/21   Soyla Dryer, PA-C    Allergies    Erythromycin  Review of Systems   Review of Systems  Constitutional:  Negative for chills and fever.  HENT:  Negative for ear pain and sore throat.   Eyes:  Negative for pain and visual disturbance.  Respiratory:  Negative for cough and shortness of breath.   Cardiovascular:  Positive for chest pain. Negative for palpitations.  Gastrointestinal:  Negative for abdominal pain and vomiting.  Genitourinary:  Negative for dysuria and hematuria.   Musculoskeletal:  Positive for back pain. Negative for arthralgias.  Skin:  Negative for color change and rash.  Neurological:  Negative for seizures and syncope.  All other systems reviewed and are negative.  Physical Exam Updated Vital Signs BP (!) 124/92   Pulse 68   Temp 98.4 F (36.9 C) (Oral)   Resp 16   Ht '5\' 3"'$  (1.6 m)   Wt 83 kg   SpO2 99%   BMI 32.42 kg/m   Physical Exam Vitals and nursing note reviewed.  Constitutional:      General: She is not in acute distress.    Appearance: She is well-developed.  HENT:     Head: Normocephalic and atraumatic.  Eyes:     Conjunctiva/sclera: Conjunctivae normal.  Cardiovascular:     Rate and Rhythm: Normal rate and regular rhythm.     Heart sounds: No murmur heard. Pulmonary:     Effort: Pulmonary effort is normal. No respiratory distress.     Breath sounds: Normal breath sounds.  Abdominal:     Palpations: Abdomen is soft.     Tenderness: There is no abdominal tenderness.  Musculoskeletal:     Cervical back: Neck supple.       Back:  Skin:    General: Skin is warm and dry.  Neurological:     Mental Status: She is alert.    ED Results / Procedures / Treatments   Labs (all labs ordered are listed, but only abnormal results are displayed) Labs Reviewed  BASIC METABOLIC PANEL - Abnormal; Notable for the following components:      Result Value   Calcium 8.7 (*)    Anion gap 4 (*)    All other components within normal limits  D-DIMER, QUANTITATIVE - Abnormal; Notable for the following components:   D-Dimer, Quant 0.59 (*)    All other components within normal limits  CBC  BRAIN NATRIURETIC PEPTIDE  TROPONIN I (HIGH SENSITIVITY)  TROPONIN I (HIGH SENSITIVITY)    EKG EKG Interpretation  Date/Time:  Friday April 22 2021 10:57:25 EDT Ventricular Rate:  59 PR Interval:  188 QRS Duration: 106 QT Interval:  434 QTC Calculation: 429 R Axis:   30 Text Interpretation: Sinus bradycardia Incomplete right bundle  branch block Borderline ECG since last tracing no significant change Confirmed by Noemi Chapel [phone]) on 04/22/2021 11:59:47 AM  Radiology DG Chest 2 View  Result Date: 04/22/2021 CLINICAL DATA:  Chest pain goes into left arm and back EXAM: CHEST - 2 VIEW COMPARISON:  Chest radiograph 09/13/2016 FINDINGS: The heart is mildly enlarged, unchanged. The mediastinal contours are within normal limits. The pulmonary arteries are prominent, unchanged. There is vascular congestion without overt pulmonary edema. There is no focal consolidation. There is no pleural effusion or pneumothorax. There is no acute osseous abnormality. IMPRESSION: Unchanged cardiomegaly and prominent pulmonary arteries. Mild vascular congestion without overt pulmonary edema. Electronically Signed   By: Valetta Mole MD   On: 04/22/2021 11:49    Procedures Procedures   Medications Ordered in ED Medications - No data to display   ED Course  I have reviewed the triage vital signs and the nursing notes.  Pertinent labs & imaging results that were available during my care of the patient were reviewed by me and considered in my medical decision making (see chart for details).    MDM Rules/Calculators/A&P                           55 year old female with past medical history as listed below presenting for complaints of chest pain with radiation to the back and left upper extremity. Pt is Aox3, no acute distress, afebrile, with stable vitals. Physical exam demonstrates reproducible tenderness to left rhomboid muscle.   -no rashes -EKG stable with no stemi or nstemi. Stable troponins, bnp, and cxr. -no pneumonia -no pneumothorax.  -d-dimer 0.59, just minimally above normal limits. Findings discussed with patient and option for CT PE given and declined at this time stating "my ride is here and I have to go".   Patient's symptoms likely musculoskeletal in nature. Motrin and Lidoderm pain patch prescriptions provided. Patient  recommended for stretching exercises and close follow up with pcp if symptoms worsen. Patient recommended for close follow up with established cardiologist for stress test due to multiple risk factors for cardiac disease. Patient agreeable to plan. All questions answered prior to discharge.       Final Clinical Impression(s) / ED Diagnoses Final diagnoses:  Chest pain, unspecified type  Back pain, unspecified back location, unspecified back pain laterality, unspecified chronicity  Musculoskeletal back pain    Rx / DC Orders ED Discharge Orders          Ordered    ibuprofen (ADVIL) 600 MG tablet  Every 8 hours        04/22/21 1404    lidocaine (LIDODERM) 5 %  Every 24 hours        04/22/21 1404             Lianne Cure, DO [id number]

## 2021-04-23 ENCOUNTER — Telehealth: Payer: Self-pay | Admitting: Internal Medicine

## 2021-04-23 DIAGNOSIS — Q211 Atrial septal defect, unspecified: Secondary | ICD-10-CM

## 2021-04-23 NOTE — Telephone Encounter (Signed)
Pt with bad finances Needs an echo to reevaluate LV function   Will need help from finance office   re coverage   Who do I call?     Hx of ASD

## 2021-04-26 NOTE — Telephone Encounter (Signed)
Please schedule for echocardiogram   Hx of asd   Will be covered 100% (charity coverage from May to Nov)

## 2021-04-26 NOTE — Addendum Note (Signed)
Addended by: Levonne Hubert on: 04/26/2021 01:41 PM   Modules accepted: Orders

## 2021-04-26 NOTE — Telephone Encounter (Signed)
Order placed and note to scheduling

## 2021-05-16 ENCOUNTER — Other Ambulatory Visit: Payer: Self-pay | Admitting: Physician Assistant

## 2021-05-16 MED ORDER — IBUPROFEN 600 MG PO TABS: 600.0000 mg | ORAL_TABLET | Freq: Three times a day (TID) | ORAL | 0 refills | Status: DC | PRN

## 2021-05-18 ENCOUNTER — Other Ambulatory Visit: Payer: Self-pay | Admitting: Physician Assistant

## 2021-05-18 DIAGNOSIS — E785 Hyperlipidemia, unspecified: Secondary | ICD-10-CM

## 2021-05-18 DIAGNOSIS — I10 Essential (primary) hypertension: Secondary | ICD-10-CM

## 2021-05-20 ENCOUNTER — Ambulatory Visit (HOSPITAL_COMMUNITY)
Admission: RE | Admit: 2021-05-20 | Discharge: 2021-05-20 | Disposition: A | Discharge status: Home / Self Care | Payer: Self-pay | Source: Ambulatory Visit | Attending: Internal Medicine | Admitting: Internal Medicine

## 2021-05-20 ENCOUNTER — Other Ambulatory Visit: Payer: Self-pay

## 2021-05-20 DIAGNOSIS — Q211 Atrial septal defect, unspecified: Secondary | ICD-10-CM

## 2021-05-20 LAB — ECHOCARDIOGRAM COMPLETE
Area-P 1/2: 2.36 cm2
S' Lateral: 2 cm

## 2021-05-20 NOTE — Progress Notes (Signed)
*  PRELIMINARY RESULTS* Echocardiogram 2D Echocardiogram has been performed.  Meghan Simmons 05/20/2021, 2:49 PM

## 2021-05-27 ENCOUNTER — Other Ambulatory Visit: Payer: Self-pay

## 2021-05-27 ENCOUNTER — Other Ambulatory Visit (HOSPITAL_COMMUNITY)
Admission: RE | Admit: 2021-05-27 | Discharge: 2021-05-27 | Disposition: A | Discharge status: Home / Self Care | Payer: Medicaid Other | Source: Ambulatory Visit | Attending: Physician Assistant | Admitting: Physician Assistant

## 2021-05-27 ENCOUNTER — Telehealth: Payer: Self-pay | Admitting: Internal Medicine

## 2021-05-27 DIAGNOSIS — Q211 Atrial septal defect, unspecified: Secondary | ICD-10-CM

## 2021-05-27 DIAGNOSIS — E785 Hyperlipidemia, unspecified: Secondary | ICD-10-CM | POA: Insufficient documentation

## 2021-05-27 DIAGNOSIS — I10 Essential (primary) hypertension: Secondary | ICD-10-CM | POA: Insufficient documentation

## 2021-05-27 LAB — COMPREHENSIVE METABOLIC PANEL
ALT: 12 U/L (ref 0–44)
AST: 16 U/L (ref 15–41)
Albumin: 4 g/dL (ref 3.5–5.0)
Alkaline Phosphatase: 77 U/L (ref 38–126)
Anion gap: 6 (ref 5–15)
BUN: 12 mg/dL (ref 6–20)
CO2: 26 mmol/L (ref 22–32)
Calcium: 8.9 mg/dL (ref 8.9–10.3)
Chloride: 107 mmol/L (ref 98–111)
Creatinine, Ser: 0.92 mg/dL (ref 0.44–1.00)
GFR, Estimated: >60 mL/min (ref >60)
Glucose, Bld: 91 mg/dL (ref 70–99)
Potassium: 4.3 mmol/L (ref 3.5–5.1)
Sodium: 139 mmol/L (ref 135–145)
Total Bilirubin: 0.6 mg/dL (ref 0.3–1.2)
Total Protein: 6.7 g/dL (ref 6.5–8.1)

## 2021-05-27 LAB — LIPID PANEL
Cholesterol: 136 mg/dL (ref 0–200)
HDL: 46 mg/dL (ref >40)
LDL Cholesterol: 77 mg/dL (ref 0–99)
Total CHOL/HDL Ratio: 3 RATIO
Triglycerides: 63 mg/dL (ref <150)
VLDL: 13 mg/dL (ref 0–40)

## 2021-05-27 NOTE — Telephone Encounter (Signed)
Patient called and said she was speaking with Dr. Harrington Challenger. She would like to speak to her again

## 2021-05-28 NOTE — Telephone Encounter (Signed)
Spoke to pt   Reviewed echo  again.   Recomm referral to [name] to discuss closure  He has been notified   She is worried   Wants something to study (handout)   I told her she would have review at appt.

## 2021-05-30 ENCOUNTER — Telehealth: Payer: Self-pay

## 2021-05-30 NOTE — Telephone Encounter (Signed)
Per Dr. Harrington Challenger, scheduled the patient for ASD consult with Dr. Burt Knack 06/17/21. She was grateful for call and agrees with plan.

## 2021-05-30 NOTE — Telephone Encounter (Signed)
Referral to [facility] placed.

## 2021-06-01 ENCOUNTER — Encounter: Payer: Self-pay | Admitting: Physician Assistant

## 2021-06-01 ENCOUNTER — Other Ambulatory Visit: Payer: Self-pay

## 2021-06-01 ENCOUNTER — Ambulatory Visit: Payer: Medicaid Other | Admitting: Physician Assistant

## 2021-06-01 VITALS — BP 153/106 | HR 77 | Temp 98.0°F | Wt 183.0 lb

## 2021-06-01 DIAGNOSIS — Q211 Atrial septal defect, unspecified: Secondary | ICD-10-CM

## 2021-06-01 DIAGNOSIS — I10 Essential (primary) hypertension: Secondary | ICD-10-CM

## 2021-06-01 DIAGNOSIS — K029 Dental caries, unspecified: Secondary | ICD-10-CM

## 2021-06-01 DIAGNOSIS — F489 Nonpsychotic mental disorder, unspecified: Secondary | ICD-10-CM

## 2021-06-01 DIAGNOSIS — E785 Hyperlipidemia, unspecified: Secondary | ICD-10-CM

## 2021-06-01 DIAGNOSIS — F17200 Nicotine dependence, unspecified, uncomplicated: Secondary | ICD-10-CM

## 2021-06-01 DIAGNOSIS — K0889 Other specified disorders of teeth and supporting structures: Secondary | ICD-10-CM

## 2021-06-01 MED ORDER — ATORVASTATIN CALCIUM 20 MG PO TABS: 20.0000 mg | ORAL_TABLET | Freq: Every day | ORAL | 0 refills | Status: DC

## 2021-06-01 MED ORDER — AMOXICILLIN 500 MG PO CAPS: 500.0000 mg | ORAL_CAPSULE | Freq: Three times a day (TID) | ORAL | 0 refills | Status: AC

## 2021-06-01 MED ORDER — LOSARTAN POTASSIUM 100 MG PO TABS
100.0000 mg | ORAL_TABLET | Freq: Every day | ORAL | 1 refills | Status: DC
Start: 2021-06-01 — End: 2021-09-20

## 2021-06-01 NOTE — Progress Notes (Signed)
BP (!) 153/106 (BP Location: Right Arm)   Pulse 77   Temp 98 F (36.7 C)   Wt 183 lb (83 kg)   SpO2 99%   BMI 32.42 kg/m    Subjective:    Patient ID: Meghan Simmons, female    DOB: 07/23/1966, 55 y.o.   MRN: [id number]  HPI: Meghan Simmons is a 55 y.o. female presenting on 06/01/2021 for Hypertension and Hyperlipidemia   HPI   Pt had a negative covid 19 screening questionnaire.   Chief Complaint  Patient presents with   Hypertension   Hyperlipidemia    BP: 149/103- left 153 106- right  She goes to [facility] for [place] issues.    Pt was seen by Dr Harrington Challenger, cardiology for ASD back in July.  She has been referred to Dr Burt Knack, interventional cardiology, to discuss repair.  Pt has been resistant to getting it repaired in the past.  Pt says she has been having tooth pain in her left lower with radiation of pain up to her ear.      Relevant past medical, surgical, family and social history reviewed and updated as indicated. Interim medical history since our last visit reviewed. Allergies and medications reviewed and updated.   Current Outpatient Medications:    atorvastatin (LIPITOR) 20 MG tablet, TAKE 1 Tablet BY MOUTH ONCE DAILY, Disp: 90 tablet, Rfl: 0   citalopram (CELEXA) 20 MG tablet, Take 10 mg by mouth daily., Disp: , Rfl:    ibuprofen (ADVIL) 600 MG tablet, Take 1 tablet (600 mg total) by mouth every 8 (eight) hours as needed., Disp: 180 tablet, Rfl: 0   losartan (COZAAR) 50 MG tablet, TAKE 1 Tablet BY MOUTH ONCE EVERY DAY, Disp: 90 tablet, Rfl: 0    Review of Systems  Per HPI unless specifically indicated above     Objective:    BP (!) 153/106 (BP Location: Right Arm)   Pulse 77   Temp 98 F (36.7 C)   Wt 183 lb (83 kg)   SpO2 99%   BMI 32.42 kg/m   Wt Readings from Last 3 Encounters:  06/01/21 183 lb (83 kg)  04/22/21 183 lb (83 kg)  04/04/21 180 lb (81.6 kg)    Physical Exam Vitals reviewed.  Constitutional:      General: She is not in acute  distress.    Appearance: She is well-developed. She is not ill-appearing.     Comments: No swelling of the face  HENT:     Head: Normocephalic and atraumatic.     Left Ear: Tympanic membrane, ear canal and external ear normal.     Mouth/Throat:     Dentition: Abnormal dentition. Dental caries present.     Comments: Pt with multiple significantly decayed teeth, down to the gum level  no abscess seen. Cardiovascular:     Rate and Rhythm: Normal rate and regular rhythm.     Heart sounds: Murmur heard.  Pulmonary:     Effort: Pulmonary effort is normal.     Breath sounds: Normal breath sounds.  Abdominal:     General: Bowel sounds are normal.     Palpations: Abdomen is soft. There is no mass.     Tenderness: There is no abdominal tenderness.  Musculoskeletal:     Cervical back: Neck supple.     Right lower leg: No edema.     Left lower leg: No edema.  Lymphadenopathy:     Cervical: No cervical adenopathy.  Skin:  General: Skin is warm and dry.  Neurological:     Mental Status: She is alert and oriented to person, place, and time.  Psychiatric:        Mood and Affect: Mood is anxious.        Behavior: Behavior is cooperative.    Results for orders placed or performed during the hospital encounter of 05/27/21  Lipid panel  Result Value Ref Range   Cholesterol 136 0 - 200 mg/dL   Triglycerides 63 <150 mg/dL   HDL 46 >40 mg/dL   Total CHOL/HDL Ratio 3.0 RATIO   VLDL 13 0 - 40 mg/dL   LDL Cholesterol 77 0 - 99 mg/dL  Comprehensive metabolic panel  Result Value Ref Range   Sodium 139 135 - 145 mmol/L   Potassium 4.3 3.5 - 5.1 mmol/L   Chloride 107 98 - 111 mmol/L   CO2 26 22 - 32 mmol/L   Glucose, Bld 91 70 - 99 mg/dL   BUN 12 6 - 20 mg/dL   Creatinine, Ser 0.92 0.44 - 1.00 mg/dL   Calcium 8.9 8.9 - 10.3 mg/dL   Total Protein 6.7 6.5 - 8.1 g/dL   Albumin 4.0 3.5 - 5.0 g/dL   AST 16 15 - 41 U/L   ALT 12 0 - 44 U/L   Alkaline Phosphatase 77 38 - 126 U/L   Total  Bilirubin 0.6 0.3 - 1.2 mg/dL   GFR, Estimated >60 >60 mL/min   Anion gap 6 5 - 15      Assessment & Plan:   Encounter Diagnoses  Name Primary?   Primary hypertension Yes   Hyperlipidemia, unspecified hyperlipidemia type    Mental health problem    ASD (atrial septal defect)    Tobacco use disorder    Dentalgia    Dental decay      -reviewed labs with pt -pt to Continue with [facility] for MH issues -Encouraged heart repair as recommended by cardiology.  She is encouraged to go to her appt 9/30 with Dr Burt Knack as scheduled.  Discussed with pt that her heart is damaged and needs to be fixed or it will shorten her life.  Discussed that she has [employer] financial assistance 100 % through 07/30/21 so the financial factors should not stand in her way.  She says she will think about it -will Increase losartan due to bp elevated -pt to Continue atorvastatin -Amoxil for teeth.  Pt is put on dental list -pt to follow up 3 months.  She is to contact office sooner prn

## 2021-06-17 ENCOUNTER — Other Ambulatory Visit: Payer: Self-pay

## 2021-06-17 ENCOUNTER — Ambulatory Visit (INDEPENDENT_AMBULATORY_CARE_PROVIDER_SITE_OTHER): Payer: Self-pay | Admitting: Cardiovascular Disease

## 2021-06-17 ENCOUNTER — Encounter: Payer: Self-pay | Admitting: Cardiovascular Disease

## 2021-06-17 ENCOUNTER — Encounter (INDEPENDENT_AMBULATORY_CARE_PROVIDER_SITE_OTHER): Payer: Self-pay

## 2021-06-17 VITALS — BP 110/62 | HR 62 | Ht 63.5 in | Wt 185.4 lb

## 2021-06-17 DIAGNOSIS — Q211 Atrial septal defect, unspecified: Secondary | ICD-10-CM

## 2021-06-17 NOTE — Progress Notes (Signed)
Cardiology Office Note:    Date:  06/17/2021   ID:  Meghan Simmons, DOB February 17, 1966, MRN [id number]  PCP:  Soyla Dryer, PA-C   [facility] Providers Cardiologist:  None     Referring MD: [name], MD   Chief Complaint  Patient presents with   Shortness of Breath    History of Present Illness:    Meghan Simmons is a 55 y.o. female referred for evaluation of atrial septal defect management by Dr. Harrington Challenger.  The patient has a history of ASD diagnosed about 8 years ago.  She was demonstrated to have a moderate to large secundum atrial septal defect with associated RV dilatation and mild RV dysfunction back in 2016.  At that time she had a transesophageal echocardiogram and a gated cardiac CTA study.  She was referred to see me but she never followed through.  Her medical care has been spotty over the last several years.  She recently came back to see Dr. Harrington Challenger with complaints of heart palpitations and shortness of breath.  She has had rare episodes of chest pain.  She is short of breath with doing housework like washing dishes or sweeping the floor.  She denies orthopnea, PND, or leg swelling.  She smokes about 1 pack of cigarettes daily.  She states that a few of her family members have heart murmurs, but nobody else has had any congenital heart defects or required cardiac surgery.  Past Medical History:  Diagnosis Date   Atrial septal defect    Heart murmur    Hypertension     Past Surgical History:  Procedure Laterality Date   CHOLECYSTECTOMY     TEE WITHOUT CARDIOVERSION N/A 11/27/2014   Procedure: TRANSESOPHAGEAL ECHOCARDIOGRAM (TEE);  Surgeon: [name], MD;  Location: [facility] ENDOSCOPY;  Service: Cardiovascular;  Laterality: N/A;    Current Medications: Current Meds  Medication Sig   atorvastatin (LIPITOR) 20 MG tablet Take 1 tablet (20 mg total) by mouth daily.   citalopram (CELEXA) 20 MG tablet Take 10 mg by mouth daily.   ibuprofen (ADVIL) 600 MG tablet Take 1 tablet  (600 mg total) by mouth every 8 (eight) hours as needed.   losartan (COZAAR) 100 MG tablet Take 1 tablet (100 mg total) by mouth daily. (Patient taking differently: Take 50 mg by mouth daily.)     Allergies:   Erythromycin   Social History   Socioeconomic History   Marital status: Single    Spouse name: Not on file   Number of children: Not on file   Years of education: Not on file   Highest education level: Not on file  Occupational History   Occupation: unemployed  Tobacco Use   Smoking status: Every Day    Packs/day: 1.00    Types: Cigarettes    Start date: 2008   Smokeless tobacco: Never  Vaping Use   Vaping Use: Never used  Substance and Sexual Activity   Alcohol use: Not Currently    Alcohol/week: 0.0 standard drinks    Comment: hx heavy etoh.  nonce since 2015   Drug use: Not Currently    Types: Marijuana, "Crack" cocaine    Comment: none since age 81.  crack only once at age 22   Sexual activity: Never    Birth control/protection: None    Comment: no partners in the last year  Other Topics Concern   Not on file  Social History Narrative   Not on file   Social Determinants  of Health   Financial Resource Strain: Not on file  Food Insecurity: Not on file  Transportation Needs: Not on file  Physical Activity: Not on file  Stress: Not on file  Social Connections: Not on file     Family History: The patient's family history includes Depression in her mother; Diabetes in her mother; Heart murmur in her mother; Hypertension in her mother; Kidney failure (age of onset: 39) in her father.  ROS:   Please see the history of present illness.    All other systems reviewed and are negative.  EKGs/Labs/Other Studies Reviewed:    The following studies were reviewed today: Cardiac CTA 02/16/2015: IMPRESSION: 1. Coronary calcium score of 143. This was 37 percentile for age and sex matched control.   2. There is normal coronary origin with right dominance. There  is possible obstructive CAD in the mid RCA and in the proximal LCX arteries, however this might be related to the poor contrast opacification.   3. There is a large secundum type atrial septal defect measuring 29 mm x 27 mm with sufficient superior, inferior, posterior rims but no anterior rim.   No anomalous pulmonary veins were identified.   4.  No VSD was identified.   5. Severe right ventricular and right atrial dilatation.   6. Dilated pulmonary artery consistent with pulmonary hypertension.  TEE 11/27/2014: Study Conclusions   - Left ventricle: Turbulent flow seen just below AV suspicious for    tiny VSD> LVEF is normal  - Left atrium: Large secundum type ASD measuring 18.4 mm No    evidence of thrombus in the atrial cavity or appendage.  - Right ventricle: RV is dilated and function is depressed.   EKG:  EKG is not ordered today.    Recent Labs: 04/22/2021: B Natriuretic Peptide 47.0; Hemoglobin 14.6; Platelets 182 05/27/2021: ALT 12; BUN 12; Creatinine, Ser 0.92; Potassium 4.3; Sodium 139  Recent Lipid Panel    Component Value Date/Time   CHOL 136 05/27/2021 1010   TRIG 63 05/27/2021 1010   HDL 46 05/27/2021 1010   CHOLHDL 3.0 05/27/2021 1010   VLDL 13 05/27/2021 1010   LDLCALC 77 05/27/2021 1010     Risk Assessment/Calculations:           Physical Exam:    VS:  BP 110/62   Pulse 62   Ht 5' 3.5" (1.613 m)   Wt 185 lb 6.4 oz (84.1 kg)   BMI 32.33 kg/m     Wt Readings from Last 3 Encounters:  06/17/21 185 lb 6.4 oz (84.1 kg)  06/01/21 183 lb (83 kg)  04/22/21 183 lb (83 kg)     GEN:  Well nourished, well developed in no acute distress HEENT: Normal NECK: No JVD; No carotid bruits LYMPHATICS: No lymphadenopathy CARDIAC: RRR, no murmurs, rubs, gallops RESPIRATORY:  Clear to auscultation without rales, wheezing or rhonchi  ABDOMEN: Soft, non-tender, non-distended MUSCULOSKELETAL:  No edema; No deformity  SKIN: Warm and dry NEUROLOGIC:  Alert and  oriented x 3 PSYCHIATRIC:  Normal affect   ASSESSMENT:    1. ASD (atrial septal defect)    PLAN:    In order of problems listed above:  The patient has a large secundum ASD.  This measured about 18 mm on TEE and significantly larger that out on cardiac CTA when it measured 27 x 29 mm.  Visually the defect does not look to be greater than 20 mm by my estimation.  I do think the patient has  sufficient rims for transcatheter closure with some question about the aortic rim.  The transesophageal echo imaging from 2016 has been personally reviewed.  The study is somewhat limited because of poor patient tolerance of the procedure.  I think there is enough information that I believe there is a high likelihood of success with transcatheter closure.  At the time of transcatheter closure, I would perform intracardiac echo to confirm anatomic suitability for transcatheter ASD closure.  The patient has a clear indication for this with New York Heart Association functional class II symptoms of dyspnea, clear evidence of significant RV enlargement and at least mild RV dysfunction, and early changes of pulmonary hypertension.  She does not appear to have severe pulmonary hypertension based on echo assessment.  I would perform right heart catheterization prior to closing her defect to confirm that she does not have severe pulmonary hypertension.  She understands that if transcatheter ASD closure is not technically feasible or is not successful, then she would be referred for consideration of patch repair with conventional heart surgery.  I reviewed the risks, indications, and alternatives to transcatheter ASD closure with the patient at length today.  These risks include but are not limited to stroke, heart attack, bleeding, vascular injury, device embolization, cardiac perforation, cardiac tamponade with hemopericardium, emergency pericardiocentesis, arrhythmia, emergency cardiac surgery, and device erosion, and either  early or late device infection with bacterial endocarditis.  These risks occur with a low likelihood of 1% or less.  The patient understands these risks and provides full informed consent for the procedure.  Plan: Aspirin 81 mg daily and Plavix 75 mg daily to start at least 4 days before the procedure and continue for 3 to 6 months following the procedure Tobacco cessation counseling is done today as part of this evaluation Proceed with transcatheter ASD closure with use of intracardiac echo via transfemoral venous access as outlined above.  Anticipate overnight observation following ASD closure Right heart catheterization at time of ASD closure to confirm no contraindication to closure with pulmonary HTN  Medication Adjustments/Labs and Tests Ordered: Current medicines are reviewed at length with the patient today.  Concerns regarding medicines are outlined above.  No orders of the defined types were placed in this encounter.  No orders of the defined types were placed in this encounter.   Patient Instructions  Medication Instructions:  No changes *If you need a refill on your cardiac medications before your next appointment, please call your pharmacy*   Lab Work: None  Testing/Procedures:  WE WILL CONTACT YOU WITH DATE/TIME FOR YOUR PROCEDURE  CLOSURE INSTRUCTIONS: You will be scheduled for a PFO/ASD CLOSURE  with Dr. Sherren Mocha.  1. Please arrive at the [facility] (Main Entrance A) at [facility]: [place], [place] [address] two hours before your procedure to ensure your preparation  Free valet parking service is available. You are allowed ONE visitor in the waiting room during your procedure. Both you and your guest must wear masks. Special note: Every effort is made to have your procedure done on time. Please understand that emergencies sometimes delay scheduled procedures.  2. Diet: Make sure to stay well hydrated the day before your procedure! Do  not eat solid foods after midnight.  You may have clear liquids until 5am upon the day of the procedure.  3. Labs: You will need to have blood drawn (CBC, BMET within 30 days of procedure). You do not need to be fasting.  4. Medication instructions  in preparation for your procedure:  1) MAKE SURE TO TAKE YOUR ASPIRIN AND PLAVIX the morning of your closure 2) Other meds may be taken as directed with a sip of water.  5. Plan for one night stay--bring personal belongings (this is a disclaimer, not an intention. We want you to go home!). 6. Bring a current list of your medications and current insurance cards. 7. You MUST have a responsible person to drive you home. 8. Someone MUST be with you the first 24 hours after you arrive home or your discharge will be delayed. 9. Please wear clothes that are easy to get on and off and wear slip-on shoes.   FOLLOW-UP APPOINTMENT: You will be scheduled for your 1 month follow-up with Nell Range, PA or Kathyrn Drown, NP [place] [address]   Signed, Sherren Mocha, MD  06/17/2021 11:37 AM    [place]

## 2021-06-17 NOTE — Patient Instructions (Addendum)
Medication Instructions:  No changes *If you need a refill on your cardiac medications before your next appointment, please call your pharmacy*   Lab Work: None  Testing/Procedures:  WE WILL CONTACT YOU WITH DATE/TIME FOR YOUR PROCEDURE  You will need to start aspirin 81 mg and Plavix 75 mg both once a day - 5 days  before your procedure day.  We will send this in to [place]: You will be scheduled for a PFO/ASD CLOSURE  with Dr. Sherren Mocha.  1. Please arrive at the [facility] (Main Entrance A) at [facility]: [place], [place] [address] two hours before your procedure to ensure your preparation  Free valet parking service is available. You are allowed ONE visitor in the waiting room during your procedure. Both you and your guest must wear masks. Special note: Every effort is made to have your procedure done on time. Please understand that emergencies sometimes delay scheduled procedures.  2. Diet: Make sure to stay well hydrated the day before your procedure! Do not eat solid foods after midnight.  You may have clear liquids until 5am upon the day of the procedure.  3. Labs: You will need to have blood drawn (CBC, BMET within 30 days of procedure). You do not need to be fasting.  4. Medication instructions in preparation for your procedure:  1) MAKE SURE TO TAKE YOUR ASPIRIN AND PLAVIX the morning of your closure 2) Other meds may be taken as directed with a sip of water.  5. Plan for one night stay--bring personal belongings (this is a disclaimer, not an intention. We want you to go home!). 6. Bring a current list of your medications and current insurance cards. 7. You MUST have a responsible person to drive you home. 8. Someone MUST be with you the first 24 hours after you arrive home or your discharge will be delayed. 9. Please wear clothes that are easy to get on and off and wear slip-on shoes.   FOLLOW-UP APPOINTMENT: You  will be scheduled for your 1 month follow-up with Nell Range, PA or Kathyrn Drown, NP [place]

## 2021-06-22 ENCOUNTER — Telehealth: Payer: Self-pay

## 2021-06-22 DIAGNOSIS — Q211 Atrial septal defect, unspecified: Secondary | ICD-10-CM

## 2021-06-22 MED ORDER — CLOPIDOGREL BISULFATE 75 MG PO TABS: 75.0000 mg | ORAL_TABLET | Freq: Every day | ORAL | 1 refills | Status: DC

## 2021-06-22 MED ORDER — ASPIRIN EC 81 MG PO TBEC: 81.0000 mg | DELAYED_RELEASE_TABLET | Freq: Every day | ORAL | 3 refills | Status: DC

## 2021-06-22 NOTE — Telephone Encounter (Signed)
The patient agrees to ASD closure on 07/20/2021. She will need same day EKG and will get labs done in [place]. Will call in ASA 81 mg and Plavix to the patient's pharmacy for the patient to start at least 4 days prior to procedure.   Will call the patient's nurse, Wannetta Sender, at [phone] to confirm everything per the patient's request.

## 2021-06-23 ENCOUNTER — Telehealth: Payer: Self-pay

## 2021-06-23 ENCOUNTER — Other Ambulatory Visit: Payer: Self-pay | Admitting: Physician Assistant

## 2021-06-23 MED ORDER — AMOXICILLIN 500 MG PO CAPS: 500.0000 mg | ORAL_CAPSULE | Freq: Three times a day (TID) | ORAL | 0 refills | Status: AC

## 2021-06-23 NOTE — Telephone Encounter (Signed)
Call from pt stating she thinks she has another abscess in gums this time on the top & requested Rx for Amoxicillin sent to [facility].

## 2021-06-28 ENCOUNTER — Telehealth: Payer: Self-pay

## 2021-06-28 NOTE — Telephone Encounter (Signed)
Trish called back and confirmed plan for patient. She will have transportation available for 10/20 visit.  Confirmed with the patient 10/20 visit, plans  to start medications 5 days prior to procedure, and procedure 11/2. She understands she will get labs, EKG and instruction letter at 10/20 visit. She was grateful for call and agrees with plan.

## 2021-06-28 NOTE — Telephone Encounter (Addendum)
Care Connect client with an appointment scheduled for 07/07/21 to arrive at 1:15 pm for a 1:30 pm appointment for pre-procedure workup prior to ASD closure with Dr Burt Knack 07/20/21.  Transportation: [place] transportation contacted and transport arranged for 07/07/21 to [facility] in [place]. Client made aware. Transportation will call day prior to discuss and confirm pick up time.  Will continue to follow as needed for further needs and resources. Client agreeable.  [employer]

## 2021-06-28 NOTE — Telephone Encounter (Signed)
Scheduled the patient for pre-procedure visit with B. Strader in the [place] office on 10/20. She will get EKG, BMET and CBC at that time. She will also obtain procedure instructions.  Called Trish to confirm plan.  Left message to call back.

## 2021-06-29 NOTE — Telephone Encounter (Signed)
Rescheduled ASD closure to 11/2 at 1230 (from 1030). Will call and confirm with Wannetta Sender (the patient's case manager who arranges transportation) tomorrow during business hours.

## 2021-06-30 ENCOUNTER — Telehealth: Payer: Self-pay | Admitting: Licensed Clinical Social Worker

## 2021-06-30 NOTE — Telephone Encounter (Signed)
CSW received referral to assist patient with transportation to upcoming procedure. CSW contacted patient and message left. Raquel Sarna, [place], [place]

## 2021-07-01 ENCOUNTER — Telehealth: Payer: Self-pay | Admitting: Licensed Clinical Social Worker

## 2021-07-01 NOTE — Telephone Encounter (Signed)
Patient called to share that she will need transportation to her procedure on 07-20-21. CSW will follow up to arrange transportation and return call to the patient.  Raquel Sarna, [place], [place]

## 2021-07-06 ENCOUNTER — Telehealth: Payer: Self-pay | Admitting: Licensed Clinical Social Worker

## 2021-07-06 ENCOUNTER — Telehealth: Payer: Self-pay

## 2021-07-06 DIAGNOSIS — Q211 Atrial septal defect, unspecified: Secondary | ICD-10-CM

## 2021-07-06 NOTE — Progress Notes (Deleted)
Cardiology Office Note    Date:  07/06/2021   ID:  Meghan Simmons, DOB 02-02-1966, MRN [id number]  PCP:  Soyla Dryer, PA-C  Cardiologist: None    No chief complaint on file.   History of Present Illness:    Meghan Simmons is a 55 y.o. female with past medical history of CAD (nonobstructive CAD by Coronary CT in 01/2015), HTN, HLD, tobacco use and large secundum ASD who presents to the office today for an updated H&P for her upcoming ASD closure.  She was examined by Dr. Harrington Challenger in 03/2021 and reported intermittent palpitations along with dyspnea on exertion.  She had previously been diagnosed with a large ASD in 2016 but had declined further evaluation at that time.  She did meet with Dr. Burt Knack in 05/2021 and the procedure was again reviewed and she wished to proceed.  It was recommended that she start ASA 81 mg daily and Plavix any 5 mg daily at least 4 days prior to her procedure and continue for 3 to 6 months following the procedure.  Her ASD closure is tentatively scheduled on 07/20/2021 and the plan is for her to have a right heart catheterization at that time to assess for pulmonary hypertension.   - Start ASA and Plavix 10/28   Past Medical History:  Diagnosis Date   Atrial septal defect    Heart murmur    Hypertension     Past Surgical History:  Procedure Laterality Date   CHOLECYSTECTOMY     TEE WITHOUT CARDIOVERSION N/A 11/27/2014   Procedure: TRANSESOPHAGEAL ECHOCARDIOGRAM (TEE);  Surgeon: [name], MD;  Location: [facility] ENDOSCOPY;  Service: Cardiovascular;  Laterality: N/A;    Current Medications: Outpatient Medications Prior to Visit  Medication Sig Dispense Refill   aspirin EC 81 MG tablet Take 1 tablet (81 mg total) by mouth daily. Swallow whole. 90 tablet 3   atorvastatin (LIPITOR) 20 MG tablet Take 1 tablet (20 mg total) by mouth daily. 90 tablet 0   citalopram (CELEXA) 20 MG tablet Take 10 mg by mouth daily.     clopidogrel (PLAVIX) 75 MG tablet Take 1  tablet (75 mg total) by mouth daily. 90 tablet 1   ibuprofen (ADVIL) 600 MG tablet Take 1 tablet (600 mg total) by mouth every 8 (eight) hours as needed. 180 tablet 0   losartan (COZAAR) 100 MG tablet Take 1 tablet (100 mg total) by mouth daily. (Patient taking differently: Take 50 mg by mouth daily.) 90 tablet 1   No facility-administered medications prior to visit.     Allergies:   Erythromycin   Social History   Socioeconomic History   Marital status: Single    Spouse name: Not on file   Number of children: Not on file   Years of education: Not on file   Highest education level: Not on file  Occupational History   Occupation: unemployed  Tobacco Use   Smoking status: Every Day    Packs/day: 1.00    Types: Cigarettes    Start date: 2008   Smokeless tobacco: Never  Vaping Use   Vaping Use: Never used  Substance and Sexual Activity   Alcohol use: Not Currently    Alcohol/week: 0.0 standard drinks    Comment: hx heavy etoh.  nonce since 2015   Drug use: Not Currently    Types: Marijuana, "Crack" cocaine    Comment: none since age 28.  crack only once at age 70   Sexual activity: Never  Birth control/protection: None    Comment: no partners in the last year  Other Topics Concern   Not on file  Social History Narrative   Not on file   Social Determinants of Health   Financial Resource Strain: Not on file  Food Insecurity: Not on file  Transportation Needs: Not on file  Physical Activity: Not on file  Stress: Not on file  Social Connections: Not on file     Family History:  The patient's ***family history includes Depression in her mother; Diabetes in her mother; Heart murmur in her mother; Hypertension in her mother; Kidney failure (age of onset: 78) in her father.   Review of Systems:    Please see the history of present illness.     All other systems reviewed and are otherwise negative except as noted above.   Physical Exam:    VS:  There were no vitals  taken for this visit.   General: Well developed, well nourished,female appearing in no acute distress. Head: Normocephalic, atraumatic. Neck: No carotid bruits. JVD not elevated.  Lungs: Respirations regular and unlabored, without wheezes or rales.  Heart: ***Regular rate and rhythm. No S3 or S4.  No murmur, no rubs, or gallops appreciated. Abdomen: Appears non-distended. No obvious abdominal masses. Msk:  Strength and tone appear normal for age. No obvious joint deformities or effusions. Extremities: No clubbing or cyanosis. No edema.  Distal pedal pulses are 2+ bilaterally. Neuro: Alert and oriented X 3. Moves all extremities spontaneously. No focal deficits noted. Psych:  Responds to questions appropriately with a normal affect. Skin: No rashes or lesions noted  Wt Readings from Last 3 Encounters:  06/17/21 185 lb 6.4 oz (84.1 kg)  06/01/21 183 lb (83 kg)  04/22/21 183 lb (83 kg)        Studies/Labs Reviewed:   EKG:  EKG is*** ordered today.  The ekg ordered today demonstrates ***  Recent Labs: 04/22/2021: B Natriuretic Peptide 47.0; Hemoglobin 14.6; Platelets 182 05/27/2021: ALT 12; BUN 12; Creatinine, Ser 0.92; Potassium 4.3; Sodium 139   Lipid Panel    Component Value Date/Time   CHOL 136 05/27/2021 1010   TRIG 63 05/27/2021 1010   HDL 46 05/27/2021 1010   CHOLHDL 3.0 05/27/2021 1010   VLDL 13 05/27/2021 1010   LDLCALC 77 05/27/2021 1010    Additional studies/ records that were reviewed today include:   Echocardiogram: 069/2022 IMPRESSIONS     1. Left ventricular ejection fraction, by estimation, is 65 to 70%. The  left ventricle has normal function. The left ventricle has no regional  wall motion abnormalities. There is mild left ventricular hypertrophy.  Left ventricular diastolic parameters  are consistent with Grade I diastolic dysfunction (impaired relaxation).  There is the interventricular septum is flattened in systole and diastole,  consistent with  right ventricular pressure and volume overload.   2. Right ventricular systolic function is low normal. The right  ventricular size is moderately enlarged. There is mildly elevated  pulmonary artery systolic pressure. The estimated right ventricular  systolic pressure is 40.9 mmHg.   3. Right atrial size was mildly dilated.   4. There is a trivial pericardial effusion posterior to the left  ventricle.   5. The mitral valve is grossly normal. Trivial mitral valve  regurgitation.   6. Tricuspid valve regurgitation is mild to moderate.   7. The aortic valve is tricuspid. Aortic valve regurgitation is not  visualized.   8. The inferior vena cava is normal in size  with greater than 50%  respiratory variability, suggesting right atrial pressure of 3 mmHg.   9. Evidence of atrial level shunting detected by color flow Doppler.  There is a large secundum atrial septal defect with predominantly left to  right shunting across the atrial septum.   Comparison(s): Prior images unable to be directly viewed.   Assessment:    No diagnosis found.   Plan:   In order of problems listed above:  ***    Shared Decision Making/Informed Consent:   {Are you ordering a CV Procedure (e.g. stress test, cath, DCCV, TEE, etc)?   Press F2        :[id number]}    Medication Adjustments/Labs and Tests Ordered: Current medicines are reviewed at length with the patient today.  Concerns regarding medicines are outlined above.  Medication changes, Labs and Tests ordered today are listed in the Patient Instructions below. There are no Patient Instructions on file for this visit.   Signed, Erma Heritage, PA-C  07/06/2021 11:22 AM    [place] [address] [place], [place] [address] Phone: [phone] Fax: [phone]

## 2021-07-06 NOTE — Telephone Encounter (Signed)
Confirmed new time with Trish. Confirmed with Raquel Sarna that she has confirmed transportation for procedure 07/20/21.

## 2021-07-06 NOTE — Telephone Encounter (Signed)
Dr. Burt Knack reviewed imaging again today and has decided Meghan Simmons is not a candidate for closure.  Spoke with the patient. She understands her pre-procedure visit tomorrow is cancelled and her procedure on 11/2 is cancelled. She understands Dr. Burt Knack will call her to discuss the new plan.  Spoke with Meghan Simmons, the patient's case worker, and confirmed she will cancel transportation for visit tomorrow.  Confirmed with Meghan Simmons transportation may be cancelled for 11/2.  Prescriptions for ASA and Plavix cancelled.

## 2021-07-06 NOTE — Telephone Encounter (Signed)
Care Navigation order entered for transportation assistance. Raquel Sarna, [place], [place]

## 2021-07-06 NOTE — Telephone Encounter (Signed)
CSW requested to assist patient with transportation to procedure scheduled for 07-20-21. CSW spoke with patient and confirmed needs and referred to [employer]. CSW available as needed and will follow up with patient as it gets closer to procedure day. Patient grateful for the support. Raquel Sarna, [place], [place]

## 2021-07-06 NOTE — Telephone Encounter (Signed)
Received call from Harland German RN from Dr. Antionette Char office. Appointment with [facility] for 07/07/21 is cancelled. Katie called to notify that transportation will not be needed for this appointment.  [facility] transportation called and transportation scheduled by [facility] for 07/07/21 to [employer] cancelled.  [name] RN [name]/[facility].

## 2021-07-06 NOTE — Addendum Note (Signed)
Addended by: Harland German A on: 07/06/2021 03:14 PM   Modules accepted: Orders

## 2021-07-07 ENCOUNTER — Ambulatory Visit: Payer: Medicaid Other | Admitting: Student

## 2021-07-14 ENCOUNTER — Telehealth: Payer: Medicaid Other | Admitting: Physician Assistant

## 2021-07-14 ENCOUNTER — Encounter: Payer: Self-pay | Admitting: Physician Assistant

## 2021-07-14 ENCOUNTER — Other Ambulatory Visit: Payer: Self-pay

## 2021-07-14 ENCOUNTER — Other Ambulatory Visit: Payer: Self-pay | Admitting: Physician Assistant

## 2021-07-14 ENCOUNTER — Ambulatory Visit (HOSPITAL_COMMUNITY)
Admission: RE | Admit: 2021-07-14 | Discharge: 2021-07-14 | Disposition: A | Discharge status: Home / Self Care | Payer: Medicaid Other | Source: Ambulatory Visit | Attending: Physician Assistant | Admitting: Physician Assistant

## 2021-07-14 ENCOUNTER — Ambulatory Visit: Payer: Medicaid Other | Admitting: Physician Assistant

## 2021-07-14 VITALS — BP 109/80 | HR 62 | Temp 97.6°F | Wt 185.0 lb

## 2021-07-14 DIAGNOSIS — Q211 Atrial septal defect, unspecified: Secondary | ICD-10-CM

## 2021-07-14 DIAGNOSIS — M25551 Pain in right hip: Secondary | ICD-10-CM | POA: Insufficient documentation

## 2021-07-14 MED ORDER — PREDNISONE 10 MG (21) PO TBPK: ORAL_TABLET | ORAL | 0 refills | Status: DC

## 2021-07-14 NOTE — Progress Notes (Signed)
BP 109/80   Pulse 62   Temp 97.6 F (36.4 C)   Wt 185 lb (83.9 kg)   SpO2 97%   BMI 32.26 kg/m    Subjective:    Patient ID: Meghan Simmons, female    DOB: [birth year], 55 y.o.   MRN: [id number]  HPI: Meghan Simmons is a 55 y.o. female presenting on 07/14/2021 for Leg Pain   HPI   Pr ia 55yoF who presents complaining of right leg pain.  She says the Right leg pain started 06/30/21.     She says she shut her finger in the car door and then she got pain s from right hip shooting down to the foot.  She denies injury to the RLE.   Pt has seen cardiology to discuss ASD closure but says she was supposed to get a call from them to discuss further but hasn't heard from the.    Relevant past medical, surgical, family and social history reviewed and updated as indicated. Interim medical history since our last visit reviewed. Allergies and medications reviewed and updated.   Current Outpatient Medications:    atorvastatin (LIPITOR) 20 MG tablet, Take 1 tablet (20 mg total) by mouth daily., Disp: 90 tablet, Rfl: 0   citalopram (CELEXA) 20 MG tablet, Take 10 mg by mouth daily., Disp: , Rfl:    ibuprofen (ADVIL) 600 MG tablet, Take 1 tablet (600 mg total) by mouth every 8 (eight) hours as needed., Disp: 180 tablet, Rfl: 0   losartan (COZAAR) 100 MG tablet, Take 1 tablet (100 mg total) by mouth daily. (Patient taking differently: Take 50 mg by mouth daily.), Disp: 90 tablet, Rfl: 1    Review of Systems  Per HPI unless specifically indicated above     Objective:    BP 109/80   Pulse 62   Temp 97.6 F (36.4 C)   Wt 185 lb (83.9 kg)   SpO2 97%   BMI 32.26 kg/m   Wt Readings from Last 3 Encounters:  07/14/21 185 lb (83.9 kg)  06/17/21 185 lb 6.4 oz (84.1 kg)  06/01/21 183 lb (83 kg)    Physical Exam Constitutional:      General: She is not in acute distress.    Appearance: She is not toxic-appearing.  HENT:     Head: Normocephalic and atraumatic.  Cardiovascular:      Pulses:          Dorsalis pedis pulses are 2+ on the right side and 2+ on the left side.  Pulmonary:     Effort: Pulmonary effort is normal. No respiratory distress.     Breath sounds: Normal breath sounds.  Abdominal:     General: Bowel sounds are normal.     Palpations: Abdomen is soft. There is no mass.     Tenderness: There is no abdominal tenderness.  Musculoskeletal:     Lumbar back: No tenderness or bony tenderness. Negative right straight leg raise test and negative left straight leg raise test.     Right hip: Tenderness present. No deformity. Normal range of motion.     Right knee: Normal.     Right lower leg: No edema.     Left lower leg: No edema.  Neurological:     Mental Status: She is alert and oriented to person, place, and time.  Psychiatric:        Behavior: Behavior normal.         Assessment & Plan:   Encounter Diagnoses  Name Primary?   Right hip pain Yes   ASD (atrial septal defect)       -rx Prednisone taper -Pt encoruaged to call dr Burt Knack office to discuss plans for ASD closure or not. -will get Xray of the hip -She says she has cafa -pt to follow up in December as scheduled.  She is to contact office sooner for worsening or new symptoms

## 2021-07-20 ENCOUNTER — Encounter (HOSPITAL_COMMUNITY): Admission: RE | Payer: Self-pay | Source: Home / Self Care

## 2021-07-20 ENCOUNTER — Ambulatory Visit (HOSPITAL_COMMUNITY): Admission: RE | Admit: 2021-07-20 | Payer: Medicaid Other | Source: Home / Self Care | Admitting: Cardiovascular Disease

## 2021-07-20 SURGERY — ATRIAL SEPTAL DEFECT (ASD) CLOSURE
Anesthesia: LOCAL

## 2021-07-22 ENCOUNTER — Other Ambulatory Visit: Payer: Self-pay | Admitting: Physician Assistant

## 2021-07-26 ENCOUNTER — Telehealth: Payer: Self-pay | Admitting: Licensed Clinical Social Worker

## 2021-07-26 NOTE — Telephone Encounter (Signed)
Spoke to the patient. Please make referral to [facility] cardiac surgery. Patient asked that we help her coordinate through [facility]

## 2021-07-26 NOTE — Telephone Encounter (Signed)
Patient's first counseling appointment was scheduled for 11 am on 08/02/21.

## 2021-07-27 NOTE — Addendum Note (Signed)
Addended by: Harland German A on: 07/27/2021 09:20 AM   Modules accepted: Orders

## 2021-07-27 NOTE — Telephone Encounter (Signed)
Referral placed. Will help arrange transportation once appointment is scheduled.

## 2021-08-01 ENCOUNTER — Other Ambulatory Visit: Payer: Self-pay

## 2021-08-01 ENCOUNTER — Emergency Department (HOSPITAL_COMMUNITY)
Admission: EM | Admit: 2021-08-01 | Discharge: 2021-08-01 | Disposition: A | Discharge status: Home / Self Care | Payer: Medicaid Other | Attending: Emergency Medicine | Admitting: Emergency Medicine

## 2021-08-01 ENCOUNTER — Encounter (HOSPITAL_COMMUNITY): Payer: Self-pay

## 2021-08-01 DIAGNOSIS — K047 Periapical abscess without sinus: Secondary | ICD-10-CM | POA: Insufficient documentation

## 2021-08-01 DIAGNOSIS — I10 Essential (primary) hypertension: Secondary | ICD-10-CM | POA: Insufficient documentation

## 2021-08-01 DIAGNOSIS — F17210 Nicotine dependence, cigarettes, uncomplicated: Secondary | ICD-10-CM | POA: Insufficient documentation

## 2021-08-01 DIAGNOSIS — Z79899 Other long term (current) drug therapy: Secondary | ICD-10-CM | POA: Insufficient documentation

## 2021-08-01 MED ORDER — AMOXICILLIN-POT CLAVULANATE 875-125 MG PO TABS: 1.0000 | ORAL_TABLET | Freq: Two times a day (BID) | ORAL | 0 refills | Status: DC

## 2021-08-01 NOTE — ED Provider Notes (Signed)
[facility] EMERGENCY DEPARTMENT Provider Note   CSN: [id number] Arrival date & time: 08/01/21  0854     History Chief Complaint  Patient presents with   Abscess    Meghan Simmons is a 55 y.o. female presenting for evaluation of dental pain and facial swelling.  Patient states she has had issues with her left upper tooth for a several years.  She intermittently has worsening pain and swelling.  On Wednesday, 5 days ago, she had increasing pain and swelling of her left upper tooth.  Since then, the swelling is extended into her face.  She denies fevers, chills, confusion, weakness, nausea, vomiting.  She states it is draining purulent material.  She does not have a dentist due to financial and transportation issues.  She has been taking ibuprofen, not taking anything else for symptoms.  Nothing makes it better or worse.  Pain does not radiate.  HPI     Past Medical History:  Diagnosis Date   Atrial septal defect    Heart murmur    Hypertension     Patient Active Problem List   Diagnosis Date Noted   Sepsis ([place]) 09/17/2016   Rash 09/17/2016   Malnutrition ([place]) 09/17/2016   Cellulitis 09/13/2016   Scabies exposure 09/13/2016   ASD (atrial septal defect) 07/25/2014   Hypertensive urgency 07/24/2014   Facial droop 07/24/2014   Tobacco use disorder 07/24/2014    Past Surgical History:  Procedure Laterality Date   CHOLECYSTECTOMY     TEE WITHOUT CARDIOVERSION N/A 11/27/2014   Procedure: TRANSESOPHAGEAL ECHOCARDIOGRAM (TEE);  Surgeon: [name], MD;  Location: [facility] ENDOSCOPY;  Service: Cardiovascular;  Laterality: N/A;     OB History     Gravida  1   Para  1   Term  1   Preterm      AB      Living  1      SAB      IAB      Ectopic      Multiple      Live Births              Family History  Problem Relation Age of Onset   Diabetes Mother    Heart murmur Mother    Hypertension Mother    Depression Mother    Kidney failure Father 25        dies while at dialysis    Social History   Tobacco Use   Smoking status: Every Day    Packs/day: 0.75    Years: 15.00    Pack years: 11.25    Types: Cigarettes    Start date: 2008   Smokeless tobacco: Never  Vaping Use   Vaping Use: Never used  Substance Use Topics   Alcohol use: Not Currently    Alcohol/week: 0.0 standard drinks    Comment: hx heavy etoh.  nonce since 2015   Drug use: Not Currently    Types: Marijuana, "Crack" cocaine    Comment: none since age 59.  crack only once at age 58    Home Medications Prior to Admission medications   Medication Sig Start Date End Date Taking? Authorizing Provider  amoxicillin-clavulanate (AUGMENTIN) 875-125 MG tablet Take 1 tablet by mouth every 12 (twelve) hours. 08/01/21  Yes Mette Southgate, PA-C  atorvastatin (LIPITOR) 20 MG tablet Take 1 tablet (20 mg total) by mouth daily. 06/01/21   Soyla Dryer, PA-C  citalopram (CELEXA) 20 MG tablet Take 10 mg by mouth  daily.    [provider]  IBU 600 MG tablet TAKE 1 Tablet  BY MOUTH EVERY 8 HOURS AS NEEDED 07/25/21   Soyla Dryer, PA-C  losartan (COZAAR) 100 MG tablet Take 1 tablet (100 mg total) by mouth daily. Patient taking differently: Take 50 mg by mouth daily. 06/01/21   Soyla Dryer, PA-C  predniSONE (STERAPRED UNI-PAK 21 TAB) 10 MG (21) TBPK tablet Use as directed 07/14/21   Soyla Dryer, PA-C    Allergies    Erythromycin  Review of Systems   Review of Systems  Constitutional:  Negative for fever.  HENT:  Positive for dental problem.    Physical Exam Updated Vital Signs BP 102/74   Pulse 84   Temp 97.9 F (36.6 C) (Oral)   Resp 18   Ht 5\' 3"  (1.6 m)   Wt 83.5 kg   SpO2 96%   BMI 32.59 kg/m   Physical Exam Vitals and nursing note reviewed.  Constitutional:      General: She is not in acute distress.    Appearance: Normal appearance.     Comments: nontoxic  HENT:     Head: Normocephalic and atraumatic.     Mouth/Throat:      Dentition: Abnormal dentition. Dental tenderness, gingival swelling, dental caries and dental abscesses present.      Comments: Overall poor dentition with multiple dental caries and tooth decay.  Left upper tooth tender with gingival swelling.  Left facial swelling, but no induration consistent with cellulitis.  No trismus.  No difficulty handling secretions. Eyes:     Conjunctiva/sclera: Conjunctivae normal.     Pupils: Pupils are equal, round, and reactive to light.  Cardiovascular:     Rate and Rhythm: Normal rate and regular rhythm.     Pulses: Normal pulses.  Pulmonary:     Effort: Pulmonary effort is normal. No respiratory distress.     Breath sounds: Normal breath sounds. No wheezing.     Comments: Speaking in full sentences.  Clear lung sounds in all fields. Abdominal:     General: There is no distension.     Palpations: Abdomen is soft.     Tenderness: There is no abdominal tenderness.  Musculoskeletal:        General: Normal range of motion.     Cervical back: Normal range of motion and neck supple.  Skin:    General: Skin is warm and dry.     Capillary Refill: Capillary refill takes less than 2 seconds.  Neurological:     Mental Status: She is alert and oriented to person, place, and time.  Psychiatric:        Mood and Affect: Mood and affect normal.        Speech: Speech normal.        Behavior: Behavior normal.    ED Results / Procedures / Treatments   Labs (all labs ordered are listed, but only abnormal results are displayed) Labs Reviewed - No data to display  EKG None  Radiology No results found.  Procedures Procedures   Medications Ordered in ED Medications - No data to display  ED Course  I have reviewed the triage vital signs and the nursing notes.  Pertinent labs & imaging results that were available during my care of the patient were reviewed by me and considered in my medical decision making (see chart for details).    MDM  Rules/Calculators/A&P  Patient presented for evaluation of day dental pain and facial swelling.  On exam, patient appears nontoxic.  Vital signs overall reassuring, doubt sepsis.  She had clear dental decay on the, however no evidence for deep space infection that would require further imaging or emergent hospitalization.  Will treat with antibiotics.  Discussed importance of follow-up with dentistry outpatient, resources given.  At this time, patient appears safe for discharge.  Return precautions given.  Patient states she understands and agrees to plan.  Final Clinical Impression(s) / ED Diagnoses Final diagnoses:  Dental abscess    Rx / DC Orders ED Discharge Orders          Ordered    amoxicillin-clavulanate (AUGMENTIN) 875-125 MG tablet  Every 12 hours        08/01/21 0926             Franchot Heidelberg, PA-C 08/01/21 0935    Varney Biles, MD 08/01/21 1336

## 2021-08-01 NOTE — ED Notes (Signed)
Secretary attempting to call safe transport at this time.

## 2021-08-01 NOTE — ED Triage Notes (Signed)
Pt arrived via EMS for swelling of the left side of her face. Pt states she has had an abscess for awhile and the swelling started Wednesday. States she hasn't been able to get into a dentist. Swelling and redness noted to left side of jaw and under eyes.

## 2021-08-01 NOTE — Discharge Instructions (Signed)
Take the antibiotics as prescribed. Take the entire course, even if symptoms improve.  Take ibuprofen 3 times a day with meals as needed for pain.   Do not take other anti-inflammatories at the same time (Advil, Motrin, naproxen, Aleve). You may supplement with Tylenol if you need further pain control. Use warm compresses for pain and swelling.  Call the dentist listed below for further management of your teeth. I have also put information about other dental practices that you may contact. You may also call your primary care doctor for assistance with a dental referral.  Return to the ER with any new, worsening, or concerning symptoms.

## 2021-08-02 ENCOUNTER — Ambulatory Visit: Payer: Medicaid Other | Admitting: Licensed Clinical Social Worker

## 2021-08-02 NOTE — Telephone Encounter (Signed)
Spoke with [place], Dr. Aundra Millet assistant.  Confirmed with [facility] Ms. Geissinger is scheduled for consult with Dr. Cheree Ditto 08/15/2021 at 1330 (with arrival time 1300).  Address:  [facility] [address] [place], [place] [address]  Left message for Wannetta Sender, the patient's [facility] representative, at [phone] to call back to help arrange transportation.

## 2021-08-05 NOTE — Telephone Encounter (Signed)
Discussed with Raquel Sarna yesterday. She will work on transportation for the patient and confirm if able.

## 2021-08-08 ENCOUNTER — Other Ambulatory Visit: Payer: Self-pay | Admitting: Physician Assistant

## 2021-08-08 DIAGNOSIS — I10 Essential (primary) hypertension: Secondary | ICD-10-CM

## 2021-08-08 DIAGNOSIS — E785 Hyperlipidemia, unspecified: Secondary | ICD-10-CM

## 2021-08-09 ENCOUNTER — Ambulatory Visit: Payer: Medicaid Other | Admitting: Licensed Clinical Social Worker

## 2021-08-09 DIAGNOSIS — F419 Anxiety disorder, unspecified: Secondary | ICD-10-CM

## 2021-08-09 NOTE — Progress Notes (Signed)
[facility] engaged patient in initial session. Patient presented with anxiety symptoms and occasional panic attacks. Patient shared about financial and social stressors. Patient shared about stressful home environment and health issues. Patient reported struggling with low self-esteem. Patient shared about previous traumas. Patient's symptoms will be addressed with biweekly sessions going forward. Next session was scheduled for 12/13 at 10 am.

## 2021-08-10 ENCOUNTER — Telehealth: Payer: Self-pay

## 2021-08-10 NOTE — Telephone Encounter (Signed)
At this time we have been unable to assist with finding transportation for Meghan Simmons to attend her scheduled appointment on 11/28 with Dr Cheree Ditto.  I have rescheduled her appointment to 12/5 at 2:45 PM with an arrival time of 2:15 PM.  The pt is aware of new appointment date and time and that we will continue to work on assisting her with transportation.

## 2021-08-15 ENCOUNTER — Telehealth (HOSPITAL_COMMUNITY): Payer: Self-pay | Admitting: Licensed Clinical Social Worker

## 2021-08-15 ENCOUNTER — Telehealth: Payer: Self-pay | Admitting: Licensed Clinical Social Worker

## 2021-08-15 NOTE — Telephone Encounter (Signed)
CSW confirmed transport for appointment on 08-22-21 at [facility]. Patient grateful for the support and assistance. CSW available as needed. Raquel Sarna, [place], [place]

## 2021-08-15 NOTE — Telephone Encounter (Signed)
CSW received request to assist with transportation to a [facility] appointment. Patient unable to obtain any transport and states she normally is assisted by [facility] although they are unable to transport. CSW will explore option with [employer] for possible transport. CSW will return call when confirmed with address and time. CSW continues to follow. Raquel Sarna, [place], [place]

## 2021-08-16 ENCOUNTER — Telehealth: Payer: Self-pay | Admitting: Physician Assistant

## 2021-08-16 NOTE — Telephone Encounter (Signed)
Pt was called after office was called by [facility] pharmacy with concern over pt rx IBU and plavix interaction.  Pt says she is not on plavix but was just given that rx to take once prior to her heart surgery.   She has not had her heart surgery yet but has appt with surgeon on 08/22/21.

## 2021-08-19 ENCOUNTER — Other Ambulatory Visit (HOSPITAL_COMMUNITY)
Admission: RE | Admit: 2021-08-19 | Discharge: 2021-08-19 | Disposition: A | Discharge status: Home / Self Care | Payer: Medicaid Other | Source: Ambulatory Visit | Attending: Physician Assistant | Admitting: Physician Assistant

## 2021-08-19 DIAGNOSIS — E785 Hyperlipidemia, unspecified: Secondary | ICD-10-CM | POA: Insufficient documentation

## 2021-08-19 LAB — LIPID PANEL
Cholesterol: 139 mg/dL (ref 0–200)
HDL: 40 mg/dL — ABNORMAL LOW (ref >40)
LDL Cholesterol: 77 mg/dL (ref 0–99)
Total CHOL/HDL Ratio: 3.5 RATIO
Triglycerides: 108 mg/dL (ref <150)
VLDL: 22 mg/dL (ref 0–40)

## 2021-08-19 LAB — COMPREHENSIVE METABOLIC PANEL
ALT: 11 U/L (ref 0–44)
AST: 13 U/L — ABNORMAL LOW (ref 15–41)
Albumin: 3.6 g/dL (ref 3.5–5.0)
Alkaline Phosphatase: 74 U/L (ref 38–126)
Anion gap: 6 (ref 5–15)
BUN: 11 mg/dL (ref 6–20)
CO2: 24 mmol/L (ref 22–32)
Calcium: 8.7 mg/dL — ABNORMAL LOW (ref 8.9–10.3)
Chloride: 108 mmol/L (ref 98–111)
Creatinine, Ser: 0.69 mg/dL (ref 0.44–1.00)
GFR, Estimated: >60 mL/min (ref >60)
Glucose, Bld: 86 mg/dL (ref 70–99)
Potassium: 4.3 mmol/L (ref 3.5–5.1)
Sodium: 138 mmol/L (ref 135–145)
Total Bilirubin: 0.6 mg/dL (ref 0.3–1.2)
Total Protein: 6.6 g/dL (ref 6.5–8.1)

## 2021-08-22 ENCOUNTER — Ambulatory Visit: Payer: Medicaid Other | Admitting: Physician Assistant

## 2021-08-22 ENCOUNTER — Ambulatory Visit: Payer: Medicaid Other | Admitting: Cardiology

## 2021-08-22 ENCOUNTER — Encounter: Payer: Self-pay | Admitting: Physician Assistant

## 2021-08-22 DIAGNOSIS — F419 Anxiety disorder, unspecified: Secondary | ICD-10-CM

## 2021-08-22 DIAGNOSIS — Q211 Atrial septal defect, unspecified: Secondary | ICD-10-CM

## 2021-08-22 DIAGNOSIS — I10 Essential (primary) hypertension: Secondary | ICD-10-CM

## 2021-08-22 DIAGNOSIS — F17200 Nicotine dependence, unspecified, uncomplicated: Secondary | ICD-10-CM

## 2021-08-22 DIAGNOSIS — E785 Hyperlipidemia, unspecified: Secondary | ICD-10-CM

## 2021-08-22 MED ORDER — FLUCONAZOLE 150 MG PO TABS: 150.0000 mg | ORAL_TABLET | Freq: Once | ORAL | 0 refills | Status: AC

## 2021-08-22 NOTE — Progress Notes (Signed)
There were no vitals taken for this visit.   Subjective:    Patient ID: Meghan Simmons, female    DOB: May 07, 1966, 55 y.o.   MRN: [id number]  HPI: Meghan Simmons is a 55 y.o. female presenting on 08/22/2021 for No chief complaint on file.   HPI  This is a telemedicine appointment via Telephone as pt does not have access to video capabilities.  She was on calendar for in person appointment but called stating her transportation cancelled.    I connected with  Meghan Simmons on 08/22/21 by a video enabled telemedicine application and verified that I am speaking with the correct person using two identifiers.   I discussed the limitations of evaluation and management by telemedicine. The patient expressed understanding and agreed to proceed.    Pt is 75yoF with HTN and dyslipidemia.    She has appointment in [place] at 2:45 today with cardiology; she doesn't know if [facility]'s [facility].   She was referred by Dr Antionette Char office for mitral valve disease.    She goes to [facility] for medication management of her anxiety; she says she has appointment there later this week  She has appointment with [facility]/counselor next week here at [facility].      Relevant past medical, surgical, family and social history reviewed and updated as indicated. Interim medical history since our last visit reviewed. Allergies and medications reviewed and updated.    Current Outpatient Medications:    atorvastatin (LIPITOR) 20 MG tablet, Take 1 tablet (20 mg total) by mouth daily., Disp: 90 tablet, Rfl: 0   citalopram (CELEXA) 20 MG tablet, Take 10 mg by mouth daily., Disp: , Rfl:    ibuprofen (ADVIL) 200 MG tablet, Take 200 mg by mouth every 6 (six) hours as needed., Disp: , Rfl:    losartan (COZAAR) 100 MG tablet, Take 1 tablet (100 mg total) by mouth daily., Disp: 90 tablet, Rfl: 1   amoxicillin-clavulanate (AUGMENTIN) 875-125 MG tablet, Take 1 tablet by mouth every 12 (twelve) hours. (Patient not taking: Reported on  08/22/2021), Disp: 14 tablet, Rfl: 0   IBU 600 MG tablet, TAKE 1 Tablet  BY MOUTH EVERY 8 HOURS AS NEEDED, Disp: 180 tablet, Rfl: 0   predniSONE (STERAPRED UNI-PAK 21 TAB) 10 MG (21) TBPK tablet, Use as directed (Patient not taking: Reported on 08/22/2021), Disp: 1 each, Rfl: 0    Review of Systems  Per HPI unless specifically indicated above     Objective:    There were no vitals taken for this visit.  Wt Readings from Last 3 Encounters:  08/01/21 184 lb (83.5 kg)  07/14/21 185 lb (83.9 kg)  06/17/21 185 lb 6.4 oz (84.1 kg)    Physical Exam Pulmonary:     Effort: No respiratory distress.     Comments: Pt is talking in complete sentences without dyspnea Neurological:     Mental Status: She is alert and oriented to person, place, and time.  Psychiatric:        Attention and Perception: Attention normal.        Speech: Speech normal.        Behavior: Behavior is cooperative.    Results for orders placed or performed during the hospital encounter of 08/19/21  Lipid panel  Result Value Ref Range   Cholesterol 139 0 - 200 mg/dL   Triglycerides 108 <150 mg/dL   HDL 40 (L) >40 mg/dL   Total CHOL/HDL Ratio 3.5 RATIO   VLDL 22  0 - 40 mg/dL   LDL Cholesterol 77 0 - 99 mg/dL  Comprehensive metabolic panel  Result Value Ref Range   Sodium 138 135 - 145 mmol/L   Potassium 4.3 3.5 - 5.1 mmol/L   Chloride 108 98 - 111 mmol/L   CO2 24 22 - 32 mmol/L   Glucose, Bld 86 70 - 99 mg/dL   BUN 11 6 - 20 mg/dL   Creatinine, Ser 0.69 0.44 - 1.00 mg/dL   Calcium 8.7 (L) 8.9 - 10.3 mg/dL   Total Protein 6.6 6.5 - 8.1 g/dL   Albumin 3.6 3.5 - 5.0 g/dL   AST 13 (L) 15 - 41 U/L   ALT 11 0 - 44 U/L   Alkaline Phosphatase 74 38 - 126 U/L   Total Bilirubin 0.6 0.3 - 1.2 mg/dL   GFR, Estimated >60 >60 mL/min   Anion gap 6 5 - 15      Assessment & Plan:   Encounter Diagnoses  Name Primary?   Primary hypertension Yes   Hyperlipidemia, unspecified hyperlipidemia type    Anxiety    Tobacco  use disorder    ASD (atrial septal defect)      -Reviewed labs with pt -Pt to continue current medications -Chart review shows that pt's appointment today is at [facility].  Pt is told that she will need to ask about financial assistance when she goes in today.  She states understanding -[facility] next week as scheduled -pt to follow up in office in 3 months.  She is to contact office sooner prn

## 2021-08-29 ENCOUNTER — Ambulatory Visit: Payer: Medicaid Other | Admitting: Physician Assistant

## 2021-08-30 ENCOUNTER — Ambulatory Visit: Payer: Medicaid Other | Admitting: Licensed Clinical Social Worker

## 2021-08-30 DIAGNOSIS — F419 Anxiety disorder, unspecified: Secondary | ICD-10-CM

## 2021-08-30 NOTE — Progress Notes (Signed)
[facility] engaged patient in [facility] follow-up session. [facility] provided reflective listening and validation as patient shared about current stressors. [facility] led patient in mindfulness exercise related to a comforting memory of patients. Next session was scheduled for 10 am on 09/14/21.

## 2021-09-06 ENCOUNTER — Ambulatory Visit: Payer: Medicaid Other | Admitting: Physician Assistant

## 2021-09-06 DIAGNOSIS — N898 Other specified noninflammatory disorders of vagina: Secondary | ICD-10-CM

## 2021-09-06 NOTE — Progress Notes (Signed)
° °  There were no vitals taken for this visit.   Subjective:    Patient ID: Meghan Simmons, female    DOB: 04/28/1966, 55 y.o.   MRN: [id number]  HPI: Meghan Simmons is a 55 y.o. female presenting on 09/06/2021 for No chief complaint on file.   HPI  This is a telemedicine appointment via telephone as pt does not have a video enabled device.  I connected with  TERIA KHACHATRYAN on 09/06/21 by a video enabled telemedicine application and verified that I am speaking with the correct person using two identifiers.   I discussed the limitations of evaluation and management by telemedicine. The patient expressed understanding and agreed to proceed.  Pt is at home.  Provider is at office.    Pt is requesting another rx for diflucan for her yeast infection.  She was given rx for it at her appointment on 08/22/21 due to her request for it.  She says she thinks she is improved but her symptoms are not gone.  She says she tried OTCs and they did not help.  She says she it itchy and has a smell.  She says maybe a little bit of discharge as well.  She is sexually active.  She says she has only 1 partner.  She says it has been about 5 years since she had STD testing.    Relevant past medical, surgical, family and social history reviewed and updated as indicated. Interim medical history since our last visit reviewed. Allergies and medications reviewed and updated.  Review of Systems  Per HPI unless specifically indicated above     Objective:    There were no vitals taken for this visit.  Wt Readings from Last 3 Encounters:  08/01/21 184 lb (83.5 kg)  07/14/21 185 lb (83.9 kg)  06/17/21 185 lb 6.4 oz (84.1 kg)    Physical Exam Pulmonary:     Effort: No respiratory distress.     Comments: Pt is talking in complete sentences without dyspnea Neurological:     Mental Status: She is alert and oriented to person, place, and time.  Psychiatric:        Attention and Perception: Attention normal.         Speech: Speech normal.          Assessment & Plan:    Encounter Diagnoses  Name Primary?   Itching in the vaginal area Yes   Foul smelling vaginal discharge       -discussed with pt that in light of her pesistent symptoms after diflucan and no response to OTCs, she needs to get STD testing.  Unfortunately that is a service that [facility] does not provide.  Pt was encouraged to contact [facility] for this.  She was given telephone number.  She states understanding and agrees

## 2021-09-14 ENCOUNTER — Ambulatory Visit: Payer: Medicaid Other | Admitting: Licensed Clinical Social Worker

## 2021-09-20 ENCOUNTER — Other Ambulatory Visit: Payer: Self-pay | Admitting: Physician Assistant

## 2021-09-20 ENCOUNTER — Telehealth (HOSPITAL_COMMUNITY): Payer: Self-pay | Admitting: Licensed Clinical Social Worker

## 2021-09-20 NOTE — Progress Notes (Signed)
Patient called to request assistance with transportation to [facility] for her follow up appointment post procedure. Patient unsure of the date and time of the appointment. CSW informed patient that will need date and time in order to make arrangements. Patient will return call to [place] when she has that information. Raquel Sarna, [place], [address]

## 2021-09-28 ENCOUNTER — Telehealth: Payer: Self-pay | Admitting: Licensed Clinical Social Worker

## 2021-09-28 NOTE — Telephone Encounter (Signed)
CSW received request from patient for transportation needs to admissions to [facility] for upcoming procedure. Patient will need ride for 1st appointment: 10/05/21 8:30am for a Covid Test @ [place] [address] when finished take to  2nd appointment: 10/05/21 for admission at [facility] [address] [place], [place] [address]. CSW forwarded request to [facility] Transportation who will contact patient with details of pick up time. CSW available as needed. Raquel Sarna, [place], [place]

## 2021-10-04 ENCOUNTER — Telehealth (HOSPITAL_COMMUNITY): Payer: Self-pay | Admitting: Licensed Clinical Social Worker

## 2021-10-04 NOTE — Telephone Encounter (Signed)
CSW received call from patient stating that her arrival time for covid test at [facility] tomorrow morning has been changed to 10:30 am arrival. CSW sent request to [facility] Transportation and requested that patient be notified of pick up time. Raquel Sarna, [place], [place]

## 2021-10-05 DIAGNOSIS — E785 Hyperlipidemia, unspecified: Secondary | ICD-10-CM | POA: Insufficient documentation

## 2021-10-06 DIAGNOSIS — Q2111 Secundum atrial septal defect: Secondary | ICD-10-CM | POA: Insufficient documentation

## 2021-10-08 DIAGNOSIS — Z8774 Personal history of (corrected) congenital malformations of heart and circulatory system: Secondary | ICD-10-CM | POA: Insufficient documentation

## 2021-10-12 ENCOUNTER — Telehealth (HOSPITAL_COMMUNITY): Payer: Self-pay | Admitting: Licensed Clinical Social Worker

## 2021-10-12 NOTE — Telephone Encounter (Signed)
CSW received call from Shirlee Limerick the Consulting civil engineer at [facility] stating that patient was ready for discharge. Patient needs transportation home and arranged through the [employer]. Raquel Sarna, [employer], [place]

## 2021-10-14 ENCOUNTER — Telehealth: Payer: Self-pay | Admitting: Licensed Clinical Social Worker

## 2021-10-14 NOTE — Telephone Encounter (Signed)
CSW contacted patient to follow up on admission at [facility]. CSW assisted with transportation needs upon discharge earlier this week. Patient states she is managing well at her friends home recovering from her hospitalization. Patient's states she will need transport back for follow up in a few weeks. Patient will contact [facility] to explore transport options. CSW available as needed. Raquel Sarna, [place], [place]

## 2021-11-08 ENCOUNTER — Ambulatory Visit: Payer: Medicaid Other | Admitting: Licensed Clinical Social Worker

## 2021-11-08 ENCOUNTER — Other Ambulatory Visit: Payer: Self-pay

## 2021-11-08 DIAGNOSIS — F419 Anxiety disorder, unspecified: Secondary | ICD-10-CM

## 2021-11-08 NOTE — Progress Notes (Signed)
[facility] engaged patient in follow-up session. [facility] provided activwe listening and validation as patient shared about current symptoms, stressors, and past traumas/losses. [facility] led patient in different forms of breathing exercises.

## 2021-11-14 NOTE — Progress Notes (Unsigned)
Cardiology Office Note   Date:  11/14/2021   ID:  Meghan Simmons, DOB 1966/06/24, MRN [id number]  PCP:  Soyla Dryer, PA-C  Cardiologist:   Dorris Carnes, MD   Pt presents for f/u of ASD    History of Present Illness: Meghan Simmons is a 56 y.o. female with a history of  HTN and a large secundum ASD  Echo in 2015 LVEF normal  RV dilated    ASD medium sized with L to R flow  PAP 46   TEE also done showing large ASD with RV dilation/dysfunction.   Suspicion for small VSD I saw her last in 2016 Cardiac CT done that showed CAD:  LAD mild; LCx small  Mod plaque, RCA with possible significant stenosis   Large ASD (27 x 29 mm) with sufficient rim on all edges except anterior.   Normal PV drainage.  No VSD noted  RV and PA enlargement noted  The pt was set up to see [name] to review studies, discuss options.   She declned this, even after discussion of risks for not evaluating/treating  The pt returns today for follow up   She says over the past few years she has had frequent skipping   Lasts about 5 min   Makes her cough    Some dizziness   No syncope  After  spells she says she will feel OK    The pt also notes that she  has had rare dizziness when heart is not racing   No synope    No PND     She notes rare chest pressure      Overall not too active    Washes dishes, sweeps/mops floor  Still smoking    1/2pp    I saw the pt in July 2022    She was seen by Andria Meuse on to have Repair of ASD by Dr Cheree Ditto   Pt had ASD repair on 1/19     Seen by Dr Cheree Ditto on 1/25     Since sseen   has occasoinal dizziness with standing   Chest is still sor   Does feel heart race at times when doing soming   Short lived   No dizziness    No outpatient medications have been marked as taking for the 11/17/21 encounter (Appointment) with [name], MD.     Allergies:   Erythromycin   Past Medical History:  Diagnosis Date   Atrial septal defect    Heart murmur    Hypertension     Past  Surgical History:  Procedure Laterality Date   CHOLECYSTECTOMY     TEE WITHOUT CARDIOVERSION N/A 11/27/2014   Procedure: TRANSESOPHAGEAL ECHOCARDIOGRAM (TEE);  Surgeon: [name], MD;  Location: [facility] ENDOSCOPY;  Service: Cardiovascular;  Laterality: N/A;     Social History:  The patient  reports that she has been smoking cigarettes. She started smoking about 15 years ago. She has a 11.25 pack-year smoking history. She has never used smokeless tobacco. She reports that she does not currently use alcohol. She reports that she does not currently use drugs after having used the following drugs: Marijuana and "Crack" cocaine.   Family History:  The patient's family history includes Depression in her mother; Diabetes in her mother; Heart murmur in her mother; Hypertension in her mother; Kidney failure (age of onset: 74) in her father.    ROS:  Please see the history of  present illness. All other systems are reviewed and  Negative to the above problem except as noted.    PHYSICAL EXAM: VS:  There were no vitals taken for this visit.  GEN: Well nourished, well developed, in no acute distress  HEENT: normal  Neck: no JVD, carotid bruits, or masses Cardiac: RRR; no murmurs  Prominent P2  NO LE  edema  Respiratory:  clear to auscultation bilaterally GI: soft, nontender, nondistended, + BS  No hepatomegaly  MS: no deformity Moving all extremities   Skin: warm and dry, no rash Neuro:  Strength and sensation are intact Psych: euthymic mood, full affect   EKG:  EKG is ordered today.  SB 58 bpm  Incmpole RBBB     Lipid Panel    Component Value Date/Time   CHOL 139 08/19/2021 1049   TRIG 108 08/19/2021 1049   HDL 40 (L) 08/19/2021 1049   CHOLHDL 3.5 08/19/2021 1049   VLDL 22 08/19/2021 1049   LDLCALC 77 08/19/2021 1049      Wt Readings from Last 3 Encounters:  08/01/21 184 lb (83.5 kg)  07/14/21 185 lb (83.9 kg)  06/17/21 185 lb 6.4 oz (84.1 kg)      ASSESSMENT AND PLAN:    ASD    Pt with large secundum ASD   This was evaluated back in 2016   At that time she declined further evaluation   Risks for not treatinrg discussed   She said she understood   SOme of concern was financial She did not return for f/u  She returns to clinic  today with complaints of skipping of her heart   Has been going on for awhile   Spells do not appear to be hemodynamically destabilizing  ? What they represent   Overall she is not too active      No presyncope/ syncope   No edema  Will review with finance offic  See first about getting an echo to reeval R sided chambers      Will also review CT scan from 2016  May need to repeat   Alos consider Zio patch   2   CAD   Noted on CT scan       The RCA was difficult to image, not clearly flow limiting    3   HTN  BP controlled  4  HL  Pt is on lipitor  Lasat LDL was 148 in May 2022   Will need reeval         Current medicines are reviewed at length with the patient today.  The patient does not have concerns regarding medicines.  Signed, Dorris Carnes, MD  11/14/2021 9:25 AM    [facility] [place], [place], [place]  [address] Phone: [phone]; Fax: [phone]

## 2021-11-15 ENCOUNTER — Other Ambulatory Visit: Payer: Self-pay | Admitting: Physician Assistant

## 2021-11-15 DIAGNOSIS — E785 Hyperlipidemia, unspecified: Secondary | ICD-10-CM

## 2021-11-15 DIAGNOSIS — I10 Essential (primary) hypertension: Secondary | ICD-10-CM

## 2021-11-17 ENCOUNTER — Ambulatory Visit (INDEPENDENT_AMBULATORY_CARE_PROVIDER_SITE_OTHER): Payer: Self-pay | Admitting: Internal Medicine

## 2021-11-17 ENCOUNTER — Other Ambulatory Visit: Payer: Self-pay

## 2021-11-17 ENCOUNTER — Encounter: Payer: Self-pay | Admitting: Internal Medicine

## 2021-11-17 VITALS — BP 132/82 | HR 88 | Ht 63.5 in | Wt 177.4 lb

## 2021-11-17 DIAGNOSIS — Z13220 Encounter for screening for lipoid disorders: Secondary | ICD-10-CM

## 2021-11-17 DIAGNOSIS — Q211 Atrial septal defect, unspecified: Secondary | ICD-10-CM

## 2021-11-17 NOTE — Patient Instructions (Addendum)
Medication Instructions:  ?Your physician recommends that you continue on your current medications as directed. Please refer to the Current Medication list given to you today. ? ?*If you need a refill on your cardiac medications before your next appointment, please call your pharmacy* ? ? ?Lab Work: ?Your physician recommends that you return for lab work in: Fasting  ?  ?If you have labs (blood work) drawn today and your tests are completely normal, you will receive your results only by: ?MyChart Message (if you have MyChart) OR ?A paper copy in the mail ?If you have any lab test that is abnormal or we need to change your treatment, we will call you to review the results. ? ? ?Testing/Procedures: ?Your physician has requested that you have an echocardiogram. Echocardiography is a painless test that uses sound waves to create images of your heart. It provides your doctor with information about the size and shape of your heart and how well your heart?s chambers and valves are working. This procedure takes approximately one hour. There are no restrictions for this procedure. ? ? ? ?Follow-Up: ?At [facility], you and your health needs are our priority.  As part of our continuing mission to provide you with exceptional heart care, we have created designated Provider Care Teams.  These Care Teams include your primary Cardiologist (physician) and Advanced Practice Providers (APPs -  Physician Assistants and Nurse Practitioners) who all work together to provide you with the care you need, when you need it. ? ?We recommend signing up for the patient portal called "MyChart".  Sign up information is provided on this After Visit Summary.  MyChart is used to connect with patients for Virtual Visits (Telemedicine).  Patients are able to view lab/test results, encounter notes, upcoming appointments, etc.  Non-urgent messages can be sent to your provider as well.   ?To learn more about what you can do with MyChart, go to  [email or web address].   ? ?Your next appointment:   ? May/ June   ? ?The format for your next appointment:   ?In Person ? ?Provider:   ?Dorris Carnes, MD  ? ? ?Other Instructions ?Thank you for choosing [facility]! ? ?  ?

## 2021-11-23 ENCOUNTER — Ambulatory Visit: Payer: Medicaid Other | Admitting: Licensed Clinical Social Worker

## 2021-11-23 ENCOUNTER — Other Ambulatory Visit: Payer: Self-pay

## 2021-11-23 DIAGNOSIS — F419 Anxiety disorder, unspecified: Secondary | ICD-10-CM

## 2021-11-23 NOTE — Progress Notes (Signed)
[facility] engaged patient in follow-up session. [facility] provided active listening and validation as patient shared about anxiety related to various life stressors. [facility] led patient in EFT tapping exercise.  ?

## 2021-11-28 ENCOUNTER — Encounter: Payer: Self-pay | Admitting: Physician Assistant

## 2021-11-28 ENCOUNTER — Other Ambulatory Visit (HOSPITAL_COMMUNITY)
Admission: RE | Admit: 2021-11-28 | Discharge: 2021-11-28 | Disposition: A | Discharge status: Home / Self Care | Payer: Medicaid Other | Source: Ambulatory Visit | Attending: Internal Medicine | Admitting: Internal Medicine

## 2021-11-28 ENCOUNTER — Other Ambulatory Visit: Payer: Self-pay | Admitting: Physician Assistant

## 2021-11-28 ENCOUNTER — Ambulatory Visit: Payer: Medicaid Other | Admitting: Physician Assistant

## 2021-11-28 ENCOUNTER — Other Ambulatory Visit: Payer: Self-pay

## 2021-11-28 ENCOUNTER — Other Ambulatory Visit (HOSPITAL_COMMUNITY)
Admission: RE | Admit: 2021-11-28 | Discharge: 2021-11-28 | Disposition: A | Discharge status: Home / Self Care | Payer: Medicaid Other | Source: Ambulatory Visit | Attending: Physician Assistant | Admitting: Physician Assistant

## 2021-11-28 VITALS — BP 119/77 | HR 81 | Temp 98.5°F | Wt 175.0 lb

## 2021-11-28 DIAGNOSIS — I10 Essential (primary) hypertension: Secondary | ICD-10-CM | POA: Insufficient documentation

## 2021-11-28 DIAGNOSIS — E785 Hyperlipidemia, unspecified: Secondary | ICD-10-CM

## 2021-11-28 DIAGNOSIS — F17200 Nicotine dependence, unspecified, uncomplicated: Secondary | ICD-10-CM

## 2021-11-28 DIAGNOSIS — F419 Anxiety disorder, unspecified: Secondary | ICD-10-CM

## 2021-11-28 DIAGNOSIS — Z8774 Personal history of (corrected) congenital malformations of heart and circulatory system: Secondary | ICD-10-CM

## 2021-11-28 DIAGNOSIS — Z13220 Encounter for screening for lipoid disorders: Secondary | ICD-10-CM

## 2021-11-28 LAB — COMPREHENSIVE METABOLIC PANEL
ALT: 16 U/L (ref 0–44)
AST: 18 U/L (ref 15–41)
Albumin: 3.9 g/dL (ref 3.5–5.0)
Alkaline Phosphatase: 83 U/L (ref 38–126)
Anion gap: 8 (ref 5–15)
BUN: 12 mg/dL (ref 6–20)
CO2: 25 mmol/L (ref 22–32)
Calcium: 9.1 mg/dL (ref 8.9–10.3)
Chloride: 107 mmol/L (ref 98–111)
Creatinine, Ser: 0.87 mg/dL (ref 0.44–1.00)
GFR, Estimated: >60 mL/min (ref >60)
Glucose, Bld: 92 mg/dL (ref 70–99)
Potassium: 4.6 mmol/L (ref 3.5–5.1)
Sodium: 140 mmol/L (ref 135–145)
Total Bilirubin: 0.4 mg/dL (ref 0.3–1.2)
Total Protein: 7.1 g/dL (ref 6.5–8.1)

## 2021-11-28 LAB — LIPID PANEL
Cholesterol: 183 mg/dL (ref 0–200)
HDL: 45 mg/dL (ref >40)
LDL Cholesterol: 125 mg/dL — ABNORMAL HIGH (ref 0–99)
Total CHOL/HDL Ratio: 4.1 RATIO
Triglycerides: 63 mg/dL (ref <150)
VLDL: 13 mg/dL (ref 0–40)

## 2021-11-28 MED ORDER — ATORVASTATIN CALCIUM 40 MG PO TABS: 40.0000 mg | ORAL_TABLET | Freq: Every day | ORAL | 1 refills | Status: DC

## 2021-11-28 NOTE — Progress Notes (Signed)
? ?BP 119/77   Pulse 81   Temp 98.5 ?F (36.9 ?C)   Wt 175 lb (79.4 kg)   SpO2 97%   BMI 30.51 kg/m?   ? ?Subjective:  ? ? Patient ID: Meghan Simmons, female    DOB: 1966/02/04, 56 y.o.   MRN: [id number] ? ?HPI: ?Meghan Simmons is a 56 y.o. female presenting on 11/28/2021 for Hyperlipidemia and Hypertension ? ? ?HPI ? ?Chief Complaint  ?Patient presents with  ? Hyperlipidemia  ? Hypertension  ? ? ? ?Pt had ASD repair in January at [facility].  She says doctors there told her to stop the losartan for now.  ? ?She had appointment with Dr Harrington Challenger, cardiologist, in early March who ordered echo.  Pt has follow up with Dr Harrington Challenger in May.  ? ?She did not get her labs drawn to check her dyslipidemia.   ? ?She is still smoking.  She says her breathing gets heavy at times.  ? ? ? ?Relevant past medical, surgical, family and social history reviewed and updated as indicated. Interim medical history since our last visit reviewed. ?Allergies and medications reviewed and updated. ? ? ? ?Current Outpatient Medications:  ?  aspirin 81 MG EC tablet, Take by mouth., Disp: , Rfl:  ?  atorvastatin (LIPITOR) 20 MG tablet, TAKE 1 Tablet BY MOUTH ONCE EVERY DAY, Disp: 90 tablet, Rfl: 0 ?  citalopram (CELEXA) 20 MG tablet, Take 10 mg by mouth daily., Disp: , Rfl:  ?  losartan (COZAAR) 100 MG tablet, TAKE 1 Tablet BY MOUTH ONCE EVERY DAY (Patient not taking: Reported on 11/17/2021), Disp: 90 tablet, Rfl: 0 ? ? ? ?Review of Systems ? ?Per HPI unless specifically indicated above ? ?   ?Objective:  ?  ?BP 119/77   Pulse 81   Temp 98.5 ?F (36.9 ?C)   Wt 175 lb (79.4 kg)   SpO2 97%   BMI 30.51 kg/m?   ?Wt Readings from Last 3 Encounters:  ?11/28/21 175 lb (79.4 kg)  ?11/17/21 177 lb 6.4 oz (80.5 kg)  ?08/01/21 184 lb (83.5 kg)  ?  ?Physical Exam ?Vitals reviewed.  ?Constitutional:   ?   General: She is not in acute distress. ?   Appearance: She is well-developed. She is not toxic-appearing.  ?   Comments: Pt smells very strongly of cigarette smoke   ?HENT:  ?   Head: Normocephalic and atraumatic.  ?Cardiovascular:  ?   Rate and Rhythm: Normal rate and regular rhythm.  ?Pulmonary:  ?   Effort: Pulmonary effort is normal.  ?   Breath sounds: Normal breath sounds.  ?Abdominal:  ?   General: Bowel sounds are normal.  ?   Palpations: Abdomen is soft. There is no mass.  ?   Tenderness: There is no abdominal tenderness.  ?Musculoskeletal:  ?   Cervical back: Neck supple.  ?   Right lower leg: No edema.  ?   Left lower leg: No edema.  ?Lymphadenopathy:  ?   Cervical: No cervical adenopathy.  ?Skin: ?   General: Skin is warm and dry.  ?Neurological:  ?   Mental Status: She is alert and oriented to person, place, and time.  ?Psychiatric:     ?   Behavior: Behavior normal.  ? ? ? ? ? ? ?   ?Assessment & Plan:  ? ?Encounter Diagnoses  ?Name Primary?  ? Hyperlipidemia, unspecified hyperlipidemia type Yes  ? Tobacco use disorder   ? Anxiety   ?  Status post atrial septal defect repair   ? ? ? ?dyslipidemia ?Pt to get fasting labs drawn.  She is to continue atorvastatin for now.  Dosage may be adjusted as needed after review of labs ? ?HCM ?Encouraged covid booster ? ?Tobacco abuse ?Counseled on Smoking cessation.  Gave handouts and information on free smoking cessation class ? ?Anxiety ?She is on citalopram and has appointment to see [facility] in April ? ?F/u 3 months.  She is to contact office sooner prn ?

## 2021-11-29 LAB — NMR, LIPOPROFILE
Cholesterol, Total: 185 mg/dL (ref 100–199)
HDL Cholesterol by NMR: 46 mg/dL (ref >39)
HDL Particle Number: 29.5 umol/L — ABNORMAL LOW (ref >=30.5)
LDL Particle Number: 1410 nmol/L — ABNORMAL HIGH (ref <1000)
LDL Size: 21.3 nm (ref >20.5)
LDL-C (NIH Calc): 125 mg/dL — ABNORMAL HIGH (ref 0–99)
LP-IR Score: 26 (ref <=45)
Small LDL Particle Number: 514 nmol/L (ref <=527)
Triglycerides by NMR: 76 mg/dL (ref 0–149)

## 2021-12-01 ENCOUNTER — Telehealth: Payer: Self-pay

## 2021-12-01 MED ORDER — EZETIMIBE 10 MG PO TABS: 10.0000 mg | ORAL_TABLET | Freq: Every day | ORAL | 3 refills | Status: DC

## 2021-12-01 NOTE — Telephone Encounter (Signed)
-----   Message from Precious Gilding, RN sent at 12/01/2021  8:50 AM EDT ----- ? ?----- Message ----- ?From: [name], MD ?Sent: 11/30/2021  11:58 PM EDT ?To: [facility] Triage ? ?Cholesterol values are still too high   Is she talking atorvastatin regularly? ?If so, then I would add Zetia to meds     ?Check lipids in 8 wks with AST   ? ?

## 2021-12-01 NOTE — Telephone Encounter (Signed)
I spoke with patient and she states she forgets a couple times a week to take lipitor. She will try to be more compliant.I e-scribed zetia 10 mg qd to [facility] and will forward results to pcp. Orders entered to repeat labs in 8 weeks at [facility] ?

## 2021-12-05 ENCOUNTER — Ambulatory Visit: Payer: Medicaid Other | Admitting: Internal Medicine

## 2021-12-06 ENCOUNTER — Telehealth: Payer: Self-pay

## 2021-12-06 NOTE — Telephone Encounter (Signed)
client needs to renew [facility] and [facility]. arranged in home visit by Bud Face for 12/08/21 at 1030am. ? ?[name] RN ?[name] ?

## 2021-12-09 ENCOUNTER — Telehealth: Payer: Self-pay

## 2021-12-13 ENCOUNTER — Ambulatory Visit (HOSPITAL_COMMUNITY)
Admission: RE | Admit: 2021-12-13 | Discharge: 2021-12-13 | Disposition: A | Discharge status: Home / Self Care | Payer: Self-pay | Source: Ambulatory Visit | Attending: Internal Medicine | Admitting: Internal Medicine

## 2021-12-13 DIAGNOSIS — Q211 Atrial septal defect, unspecified: Secondary | ICD-10-CM

## 2021-12-13 LAB — ECHOCARDIOGRAM COMPLETE
AR max vel: 2.38 cm2
AV Area VTI: 2.42 cm2
AV Area mean vel: 2.46 cm2
AV Mean grad: 6 mmHg
AV Peak grad: 11.7 mmHg
Ao pk vel: 1.71 m/s
Area-P 1/2: 3.99 cm2
S' Lateral: 2.7 cm

## 2021-12-13 NOTE — Progress Notes (Signed)
*  PRELIMINARY RESULTS* ?Echocardiogram ?2D Echocardiogram has been performed. ? ?Meghan Simmons ?12/13/2021, 1:37 PM ?

## 2021-12-14 NOTE — Telephone Encounter (Signed)
Client of [facility] in need of transportation. Bud Face will complete transportation request with [employer] [facility] for 12/13/21 and 12/16/21 ? ?Mayra Reel RN ?

## 2021-12-16 ENCOUNTER — Telehealth: Payer: Self-pay

## 2021-12-16 NOTE — Telephone Encounter (Signed)
Patient notified and verbalized understanding. Questions, if any, were answered.  ?

## 2021-12-19 ENCOUNTER — Other Ambulatory Visit: Payer: Self-pay | Admitting: Physician Assistant

## 2021-12-26 ENCOUNTER — Telehealth: Payer: Self-pay

## 2021-12-26 NOTE — Telephone Encounter (Signed)
[facility] client contacted via text this morning need for transportation for [facility] on 12/28/21 at 10 am. ?[facility] sent reqeust via secure email. ? ?Client notified. ? ?[name] RN ?[name] ?

## 2021-12-28 ENCOUNTER — Ambulatory Visit: Payer: Medicaid Other | Admitting: Licensed Clinical Social Worker

## 2021-12-28 DIAGNOSIS — F419 Anxiety disorder, unspecified: Secondary | ICD-10-CM

## 2021-12-28 NOTE — Progress Notes (Signed)
[facility] engaged patient in follow-up session. [facility] provided active listening and validation as patient shared about current stressors and symptoms. [facility] led patient in 4-7-8 breathing exercise. ?

## 2022-01-02 ENCOUNTER — Ambulatory Visit: Payer: Medicaid Other | Admitting: Physician Assistant

## 2022-01-02 DIAGNOSIS — L304 Erythema intertrigo: Secondary | ICD-10-CM

## 2022-01-02 DIAGNOSIS — F17200 Nicotine dependence, unspecified, uncomplicated: Secondary | ICD-10-CM

## 2022-01-02 MED ORDER — MICONAZOLE NITRATE 2 % EX CREA: 1.0000 "application " | TOPICAL_CREAM | Freq: Two times a day (BID) | CUTANEOUS | 1 refills | Status: DC

## 2022-01-02 NOTE — Progress Notes (Signed)
? ?  There were no vitals taken for this visit.  ? ?Subjective:  ? ? Patient ID: Meghan Simmons, female    DOB: 1966/06/20, 56 y.o.   MRN: [id number] ? ?HPI: ?Meghan Simmons is a 57 y.o. female presenting on 01/02/2022 for No chief complaint on file. ? ? ?HPI ? ? ?Pt is 24yoF presenting for Rash on hands,  abdomen, genitals.  She says It started several months ago but is worse recently. ? ?She has been using permethrin only on spots.   (She says she went to [facility] for the rash and that is what they gave her) ? ?She didn't mention it at her appt here 11/28/21 and it was not noticed ? ?Losartan held per cardiology ? ?She says she is washing her rash areas many times daily with very hot water. ? ? ? ? ?Relevant past medical, surgical, family and social history reviewed and updated as indicated. Interim medical history since our last visit reviewed. ?Allergies and medications reviewed and updated. ? ? ?Current Outpatient Medications:  ?  aspirin 81 MG EC tablet, Take by mouth., Disp: , Rfl:  ?  atorvastatin (LIPITOR) 40 MG tablet, Take 1 tablet (40 mg total) by mouth daily., Disp: 90 tablet, Rfl: 1 ?  citalopram (CELEXA) 20 MG tablet, Take 10 mg by mouth daily., Disp: , Rfl:  ?  permethrin (ELIMITE) 5 % cream, Apply 1 application. topically once., Disp: , Rfl:  ?  ezetimibe (ZETIA) 10 MG tablet, Take 1 tablet (10 mg total) by mouth daily. (Patient not taking: Reported on 01/02/2022), Disp: 90 tablet, Rfl: 3 ?  losartan (COZAAR) 100 MG tablet, TAKE 1 Tablet BY MOUTH ONCE EVERY DAY (Patient not taking: Reported on 01/02/2022), Disp: 90 tablet, Rfl: 0 ? ? ?Review of Systems ? ?Per HPI unless specifically indicated above ? ?   ?Objective:  ?  ?There were no vitals taken for this visit.  ?Wt Readings from Last 3 Encounters:  ?11/28/21 175 lb (79.4 kg)  ?11/17/21 177 lb 6.4 oz (80.5 kg)  ?08/01/21 184 lb (83.5 kg)  ?  ?Physical Exam ?Constitutional:   ?   General: She is not in acute distress. ?   Appearance: She is not toxic-appearing.   ?   Comments: Disheveled.  Very strong cigarette smoke odor.  ?Pulmonary:  ?   Effort: No respiratory distress.  ?Skin: ?   Findings: Rash present.  ?   Comments: Very red rash abdominal pannus and in skin fold around umbilicus and inferior bilateral breasts.  Both hands with eruption not pronounced like trunk eruption.  ?Neurological:  ?   Mental Status: She is oriented to person, place, and time.  ? ? ? ? ? ?   ?Assessment & Plan:  ? ? ?Encounter Diagnoses  ?Name Primary?  ? Intertrigo Yes  ? Tobacco use disorder   ? ? ? ? ?-Rx miconazole and counseled at length about stopping the frequent hot water washes, harsh soaps, home remedies.  She is to stop the permethrin. ?-F/u as scheduled ?

## 2022-01-02 NOTE — Patient Instructions (Signed)
Intertrigo Intertrigo is skin irritation (inflammation) that happens in warm, moist areas of the body. The irritation can cause a rash and make skin raw and itchy. The rash is usually pink or red. It happens mostly between folds of skin or where skin rubs together, such as: Between the toes. In the armpits. In the groin area. Under the belly. Under the breasts. Around the butt area. This condition is not passed from person to person (is not contagious). What are the causes? Heat, moisture, rubbing, and not enough air movement. The condition can be made worse by: Sweat. Bacteria. A fungus, such as yeast. What increases the risk? Moisture in your skin folds. You are more likely to develop this condition if you: Have diabetes. Are overweight. Are not able to move around. Live in a warm and moist climate. Wear splints, braces, or other medical devices. Are not able to control your pee (urine) or poop (stool). What are the signs or symptoms? A pink or red skin rash in the skin fold or near the skin fold. Raw or scaly skin. Itching. A burning feeling. Bleeding. Leaking fluid. A bad smell. How is this treated? Cleaning and drying your skin. Taking an antibiotic medicine or using an antibiotic skin cream for a bacterial infection. Using an antifungal cream on your skin or taking pills for an infection that was caused by a fungus, such as yeast. Using a steroid ointment to stop the itching and irritation. Separating the skin fold with a clean cotton cloth to absorb moisture and allow air to flow into the area. Follow these instructions at home: Keep the affected area clean and dry. Do not scratch your skin. Stay cool as much as you can. Use an air conditioner or a fan, if you have one. Apply over-the-counter and prescription medicines only as told by your doctor. If you were prescribed an antibiotic medicine, use it as told by your doctor. Do not stop using the antibiotic even if  your condition starts to get better. Keep all follow-up visits as told by your doctor. This is important. How is this prevented?  Stay at a healthy weight. Take care of your feet. This is very important if you have diabetes. You should: Wear shoes that fit well. Keep your feet dry. Wear clean cotton or wool socks. Protect the skin in your groin and butt area as told by your doctor. To do this: Follow a regular cleaning routine. Use creams, powders, or ointments that protect your skin. Change protection pads often. Do not wear tight clothes. Wear clothes that: Are loose. Take moisture away from your body. Are made of cotton. Wear a bra that gives good support, if needed. Shower and dry yourself well after being active. Use a hair dryer on a cool setting to dry between skin folds. Keep your blood sugar under control if you have diabetes. Contact a doctor if: Your symptoms do not get better with treatment. Your symptoms get worse or they spread. You notice more redness and warmth. You have a fever. Summary Intertrigo is skin irritation that occurs when folds of skin rub together. This condition is caused by heat, moisture, and rubbing. This condition may be treated by cleaning and drying your skin and with medicines. Apply over-the-counter and prescription medicines only as told by your doctor. Keep all follow-up visits as told by your doctor. This is important. This information is not intended to replace advice given to you by your health care provider. Make sure you discuss   any questions you have with your health care provider. Document Revised: 06/20/2021 Document Reviewed: 06/20/2021 Elsevier Patient Education  2023 Elsevier Inc.  

## 2022-01-24 ENCOUNTER — Telehealth: Payer: Self-pay

## 2022-01-24 ENCOUNTER — Ambulatory Visit: Payer: Medicaid Other | Admitting: Licensed Clinical Social Worker

## 2022-01-24 DIAGNOSIS — F419 Anxiety disorder, unspecified: Secondary | ICD-10-CM

## 2022-01-24 NOTE — Telephone Encounter (Signed)
Client contacted RN for [facility] that she needs transportation for 02/02/22 to Cardiologist. ?Will arrange transportation with [facility] for client to her follow up. ? ?No other needs at this time. ? ?[name] RN ?[name]. ?

## 2022-01-24 NOTE — Progress Notes (Signed)
[facility] engaged patient in telehealth follow-up visit. [facility] provided reflective listening and validation as patient shared about current symptoms and stressors. Patient assisted patient in identifying thoughts and feelings, and expressing them.  ?

## 2022-02-01 NOTE — Progress Notes (Signed)
Cardiology Office Note   Date:  02/02/2022   ID:  Meghan Simmons, DOB 08-Jun-1966, MRN [id number]  PCP:  Soyla Dryer, PA-C  Cardiologist:   Dorris Carnes, MD   Pt presents for f/u of ASD    History of Present Illness: Meghan Simmons is a 56 y.o. female with a history of  HTN and a large secundum ASD  Echo in 2015 LVEF normal  RV dilated    ASD medium sized with L to R flow  PAP 46   TEE also done showing large ASD with RV dilation/dysfunction.   Suspicion for small VSD I saw her last in 2016 Cardiac CT done that showed CAD:  LAD mild; LCx small  Mod plaque, RCA with possible significant stenosis   Large ASD (27 x 29 mm) with sufficient rim on all edges except anterior.   Normal PV drainage.  No VSD noted  RV and PA enlargement noted  In 2016 the  pt was set up to see Meghan Simmons to review studies, discuss options.   She declned this, even after discussion of risks for not evaluating/treating  The pt came back to clinic in July 2022   Complained of more skipping   Episodes lasted about 5 min,some dizziness + coughing.    The pt was not very active  Still smoking   She saw Meghan Simmons after and went on to hae ASD repair by Meghan Simmons at [facility], 2023    I last saw the pt in clinic in March 2022  Since seen she denies  CP   Breathing is a little short at times   Stable overall   She says she smokes 10 to 16 cigs per ay  BP at house was 160/  UP and down     Since seen she says she is having problems with pain in her legs  She says her leg is giving her a fit   Starts in low back and goes to foot    iiscomfort is constant at times   The pt says she has had back problmes since the fall for spotng eve been bothering her since her teens   [address]  The pt admits to smoking 10 to 15 cigs per day   Diet   Breakfast:   Scrampbled or boiled eggs   Cereal   Lunch  Skips Spper   Hot dogs   Chicken in oven   Potato or be   Current Meds  Medication Sig   aspirin 81 MG EC tablet Take by mouth.    atorvastatin (LIPITOR) 20 MG tablet Take 20 mg by mouth daily.   citalopram (CELEXA) 20 MG tablet Take 10 mg by mouth daily.   ezetimibe (ZETIA) 10 MG tablet Take 1 tablet (10 mg total) by mouth daily.   miconazole (MICATIN) 2 % cream Apply 1 application. topically 2 (two) times daily.     Allergies:   Erythromycin   Past Medical History:  Diagnosis Date   Atrial septal defect    Heart murmur    Hypertension     Past Surgical History:  Procedure Laterality Date   CHOLECYSTECTOMY     TEE WITHOUT CARDIOVERSION N/A 11/27/2014   Procedure: TRANSESOPHAGEAL ECHOCARDIOGRAM (TEE);  Surgeon: Meghan Records, MD;  Location: [facility] ENDOSCOPY;  Service: Cardiovascular;  Laterality: N/A;     Social History:  The patient  reports that she has been smoking cigarettes. She started smoking about 15  years ago. She has a 11.25 pack-year smoking history. She has never used smokeless tobacco. She reports that she does not currently use alcohol. She reports that she does not currently use drugs after having used the following drugs: Marijuana and "Crack" cocaine.   Family History:  The patient's family history includes Depression in her mother; Diabetes in her mother; Heart murmur in her mother; Hypertension in her mother; Kidney failure (age of onset: 43) in her father.    ROS:  Please see the history of present illness. All other systems are reviewed and  Negative to the above problem except as noted.    PHYSICAL EXAM: VS:  BP 122/78   Pulse 76   Ht 5' 3.5" (1.613 m)   Wt 174 lb (78.9 kg)   SpO2 98%   BMI 30.34 kg/m   GEN: Obese 55 yo in no acute distress  HEENT: normal  Neck: no JVD, carotid bruit Cardiac: RRR; no murmurs  NO LE  edema  Respiratory:  clear to auscultation bilaterally GI: soft, nontender, nondistended, + BS  No hepatomegaly  MS: no deformity Moving all extremities   Skin: warm and dry, no rash Neuro:  Strength and sensation are intact Psych: euthymic mood, full affect   EKG:   EKG is not ordered    Echo   12/13/21  1. Post surgical repair ASD, [facility] 10/06/21. No residual shunt. Left ventricular ejection fraction, by estimation, is 60 to 65%. The left ventricle has normal function. The left ventricle has no regional wall motion abnormalities. There is mild left ventricular hypertrophy of the basal-septal segment. Left ventricular diastolic parameters are consistent with Grade I diastolic dysfunction (impaired relaxation). 2. Right ventricular systolic function is normal. The right ventricular size is normal. There is normal pulmonary artery systolic pressure. 3. 4. Post surgical repair ASD, [facility] 10/06/21. No residual shunt. The mitral valve is normal in structure. No evidence of mitral valve regurgitation. No evidence of mitral stenosis. 5. The aortic valve is normal in structure. Aortic valve regurgitation is not visualized. No aortic stenosis is present. 6. The inferior vena cava is normal in size with greater than 50% respiratory variability, suggesting right atrial pressure of 3 mmHg.   Lipid Panel    Component Value Date/Time   CHOL 183 11/28/2021 1000   TRIG 63 11/28/2021 1000   TRIG 76 11/28/2021 1000   HDL 45 11/28/2021 1000   HDL 46 11/28/2021 1000   CHOLHDL 4.1 11/28/2021 1000   VLDL 13 11/28/2021 1000   LDLCALC 125 (H) 11/28/2021 1000      Wt Readings from Last 3 Encounters:  02/02/22 174 lb (78.9 kg)  11/28/21 175 lb (79.4 kg)  11/17/21 177 lb 6.4 oz (80.5 kg)      ASSESSMENT AND PLAN:    ASD   Pt is s/p secundum ASD repair in Jan 2023  REcovering well      Echo in March noted above looks good    Follow    Continue ASA   CAD   CT coronary angiogram done in 2016   Score wa [id number]   Poor study    Possible obstruction in M RCA and prox LCx    Remains with no symptoms of angina   Control risk factor   3   HTN  BP is OK   Follow   4  HL   Reviewed meds   Take lipitor and Zetia at dinner    Follow I future  5  Ortho  Will refer to  Dr Meghan Simmons for R leg pain evaluation     6  Dentistry  Needs an appt         Current medicines are reviewed at length with the patient today.  The patient does not have concerns regarding medicines.  Signed, Dorris Carnes, MD  02/02/2022 11:42 PM    [address], [place], [place]  [address] Phone: [phone]; Fax: [phone]

## 2022-02-02 ENCOUNTER — Ambulatory Visit (INDEPENDENT_AMBULATORY_CARE_PROVIDER_SITE_OTHER): Payer: Self-pay | Admitting: Internal Medicine

## 2022-02-02 ENCOUNTER — Encounter: Payer: Self-pay | Admitting: Internal Medicine

## 2022-02-02 VITALS — BP 122/78 | HR 76 | Ht 63.5 in | Wt 174.0 lb

## 2022-02-02 DIAGNOSIS — M549 Dorsalgia, unspecified: Secondary | ICD-10-CM

## 2022-02-02 NOTE — Patient Instructions (Signed)
Medication Instructions:  Your physician recommends that you continue on your current medications as directed. Please refer to the Current Medication list given to you today.  *If you need a refill on your cardiac medications before your next appointment, please call your pharmacy*   Lab Work: NONE   If you have labs (blood work) drawn today and your tests are completely normal, you will receive your results only by: [place] (if you have MyChart) OR A paper copy in the mail If you have any lab test that is abnormal or we need to change your treatment, we will call you to review the results.   Testing/Procedures: NONE    Follow-Up: At [facility], you and your health needs are our priority.  As part of our continuing mission to provide you with exceptional heart care, we have created designated Provider Care Teams.  These Care Teams include your primary Cardiologist (physician) and Advanced Practice Providers (APPs -  Physician Assistants and Nurse Practitioners) who all work together to provide you with the care you need, when you need it.  We recommend signing up for the patient portal called "MyChart".  Sign up information is provided on this After Visit Summary.  MyChart is used to connect with patients for Virtual Visits (Telemedicine).  Patients are able to view lab/test results, encounter notes, upcoming appointments, etc.  Non-urgent messages can be sent to your provider as well.   To learn more about what you can do with MyChart, go to [email or web address].    Your next appointment:   6 month(s)  The format for your next appointment:   In Person  Provider:   Dorris Carnes, MD    Other Instructions Thank you for choosing [facility]!    Important Information About Sugar

## 2022-02-15 ENCOUNTER — Telehealth: Payer: Self-pay

## 2022-02-15 NOTE — Telephone Encounter (Signed)
Client texted this RN asking her approval dates for [employer] , stating she cannot find her letter and an MD office is asking.  [place] program manager [facility]: States client approved 100% [facility] financial assistance until 03/16/22. Updated client information and provided client customer service phone number to give to that provider office if needed to confirm. [phone].  [place] [employer]

## 2022-02-16 ENCOUNTER — Telehealth: Payer: Self-pay

## 2022-02-16 ENCOUNTER — Other Ambulatory Visit: Payer: Self-pay | Admitting: Physician Assistant

## 2022-02-16 DIAGNOSIS — D649 Anemia, unspecified: Secondary | ICD-10-CM

## 2022-02-16 DIAGNOSIS — I10 Essential (primary) hypertension: Secondary | ICD-10-CM

## 2022-02-16 DIAGNOSIS — E785 Hyperlipidemia, unspecified: Secondary | ICD-10-CM

## 2022-02-16 NOTE — Telephone Encounter (Signed)
Called client regarding transportation needs arranged for the following dates and times. Arranged with [employer]. No answer left VM , will plan to call again tomorrow morning to provide pick up times to client.  02/24/22 to [place] [address] for labs at [place] pick up 0830.   02/28/22 To [place], [facility] at [employer] with provider( PICK up time 0830) then 1000 am with [facility]  02/28/22 To [place] appointment at 1100 am then home after completion of appointment.   [place] [employer]

## 2022-02-22 ENCOUNTER — Telehealth: Payer: Self-pay

## 2022-02-22 NOTE — Telephone Encounter (Signed)
Client called wanting confirmation about her transportation scheduled for 02/24/22 and 02/28/22. Gave client confirmation of pick up times . No other needs.  [name] RN [name]/[facility]

## 2022-02-24 ENCOUNTER — Other Ambulatory Visit (HOSPITAL_COMMUNITY)
Admission: RE | Admit: 2022-02-24 | Discharge: 2022-02-24 | Disposition: A | Discharge status: Home / Self Care | Payer: Medicaid Other | Source: Ambulatory Visit | Attending: Internal Medicine | Admitting: Internal Medicine

## 2022-02-24 DIAGNOSIS — E785 Hyperlipidemia, unspecified: Secondary | ICD-10-CM | POA: Insufficient documentation

## 2022-02-24 DIAGNOSIS — Z79899 Other long term (current) drug therapy: Secondary | ICD-10-CM

## 2022-02-24 DIAGNOSIS — D649 Anemia, unspecified: Secondary | ICD-10-CM | POA: Insufficient documentation

## 2022-02-24 DIAGNOSIS — I10 Essential (primary) hypertension: Secondary | ICD-10-CM

## 2022-02-24 LAB — CBC
HCT: 42.6 % (ref 36.0–46.0)
Hemoglobin: 13.9 g/dL (ref 12.0–15.0)
MCH: 28.6 pg (ref 26.0–34.0)
MCHC: 32.6 g/dL (ref 30.0–36.0)
MCV: 87.7 fL (ref 80.0–100.0)
Platelets: 224 10*3/uL (ref 150–400)
RBC: 4.86 MIL/uL (ref 3.87–5.11)
RDW: 14.6 % (ref 11.5–15.5)
WBC: 6.4 10*3/uL (ref 4.0–10.5)
nRBC: 0 % (ref 0.0–0.2)

## 2022-02-24 LAB — COMPREHENSIVE METABOLIC PANEL
ALT: 7 U/L (ref 0–44)
AST: 12 U/L — ABNORMAL LOW (ref 15–41)
Albumin: 4 g/dL (ref 3.5–5.0)
Alkaline Phosphatase: 85 U/L (ref 38–126)
Anion gap: 5 (ref 5–15)
BUN: 13 mg/dL (ref 6–20)
CO2: 28 mmol/L (ref 22–32)
Calcium: 9.1 mg/dL (ref 8.9–10.3)
Chloride: 108 mmol/L (ref 98–111)
Creatinine, Ser: 0.91 mg/dL (ref 0.44–1.00)
GFR, Estimated: >60 mL/min (ref >60)
Glucose, Bld: 85 mg/dL (ref 70–99)
Potassium: 4.4 mmol/L (ref 3.5–5.1)
Sodium: 141 mmol/L (ref 135–145)
Total Bilirubin: 0.4 mg/dL (ref 0.3–1.2)
Total Protein: 6.9 g/dL (ref 6.5–8.1)

## 2022-02-24 LAB — LIPID PANEL
Cholesterol: 174 mg/dL (ref 0–200)
HDL: 44 mg/dL (ref >40)
LDL Cholesterol: 115 mg/dL — ABNORMAL HIGH (ref 0–99)
Total CHOL/HDL Ratio: 4 RATIO
Triglycerides: 76 mg/dL (ref <150)
VLDL: 15 mg/dL (ref 0–40)

## 2022-02-28 ENCOUNTER — Ambulatory Visit (INDEPENDENT_AMBULATORY_CARE_PROVIDER_SITE_OTHER): Payer: Self-pay

## 2022-02-28 ENCOUNTER — Ambulatory Visit (INDEPENDENT_AMBULATORY_CARE_PROVIDER_SITE_OTHER): Payer: Self-pay | Admitting: Orthopedic Surgery

## 2022-02-28 ENCOUNTER — Ambulatory Visit: Payer: Medicaid Other | Admitting: Physician Assistant

## 2022-02-28 ENCOUNTER — Other Ambulatory Visit: Payer: Self-pay | Admitting: Physician Assistant

## 2022-02-28 ENCOUNTER — Encounter: Payer: Self-pay | Admitting: Physician Assistant

## 2022-02-28 ENCOUNTER — Other Ambulatory Visit (HOSPITAL_COMMUNITY)
Admission: RE | Admit: 2022-02-28 | Discharge: 2022-02-28 | Disposition: A | Discharge status: Home / Self Care | Payer: Medicaid Other | Source: Ambulatory Visit | Attending: Physician Assistant | Admitting: Physician Assistant

## 2022-02-28 ENCOUNTER — Encounter: Payer: Self-pay | Admitting: Orthopedic Surgery

## 2022-02-28 ENCOUNTER — Ambulatory Visit: Payer: Medicaid Other | Admitting: Licensed Clinical Social Worker

## 2022-02-28 VITALS — BP 170/103 | HR 60 | Ht 63.5 in | Wt 170.0 lb

## 2022-02-28 VITALS — BP 136/82 | HR 66 | Temp 98.0°F | Ht 63.5 in | Wt 173.0 lb

## 2022-02-28 DIAGNOSIS — G8929 Other chronic pain: Secondary | ICD-10-CM | POA: Insufficient documentation

## 2022-02-28 DIAGNOSIS — Z8774 Personal history of (corrected) congenital malformations of heart and circulatory system: Secondary | ICD-10-CM

## 2022-02-28 DIAGNOSIS — F17200 Nicotine dependence, unspecified, uncomplicated: Secondary | ICD-10-CM

## 2022-02-28 DIAGNOSIS — B354 Tinea corporis: Secondary | ICD-10-CM

## 2022-02-28 DIAGNOSIS — Z1211 Encounter for screening for malignant neoplasm of colon: Secondary | ICD-10-CM

## 2022-02-28 DIAGNOSIS — M545 Low back pain, unspecified: Secondary | ICD-10-CM

## 2022-02-28 DIAGNOSIS — M5412 Radiculopathy, cervical region: Secondary | ICD-10-CM

## 2022-02-28 DIAGNOSIS — Z1239 Encounter for other screening for malignant neoplasm of breast: Secondary | ICD-10-CM

## 2022-02-28 DIAGNOSIS — F419 Anxiety disorder, unspecified: Secondary | ICD-10-CM

## 2022-02-28 DIAGNOSIS — E785 Hyperlipidemia, unspecified: Secondary | ICD-10-CM

## 2022-02-28 LAB — SEDIMENTATION RATE: Sed Rate: 8 mm/hr (ref 0–22)

## 2022-02-28 MED ORDER — ATORVASTATIN CALCIUM 40 MG PO TABS: 40.0000 mg | ORAL_TABLET | Freq: Every day | ORAL | 1 refills | Status: DC

## 2022-02-28 MED ORDER — EZETIMIBE 10 MG PO TABS: 10.0000 mg | ORAL_TABLET | Freq: Every day | ORAL | 1 refills | Status: DC

## 2022-02-28 MED ORDER — CYCLOBENZAPRINE HCL 10 MG PO TABS: 10.0000 mg | ORAL_TABLET | Freq: Two times a day (BID) | ORAL | 0 refills | Status: DC | PRN

## 2022-02-28 MED ORDER — PREDNISONE 10 MG (21) PO TBPK
ORAL_TABLET | ORAL | 0 refills | Status: DC
Start: 2022-02-28 — End: 2022-04-12

## 2022-02-28 NOTE — Progress Notes (Signed)
BP 136/82   Pulse 66   Temp 98 F (36.7 C)   Ht 5' 3.5" (1.613 m)   Wt 173 lb (78.5 kg)   SpO2 98%   BMI 30.16 kg/m    Subjective:    Patient ID: Meghan Simmons, female    DOB: 09-23-1965, 56 y.o.   MRN: [id number]  HPI: Meghan Simmons is a 56 y.o. female presenting on 02/28/2022 for Hypertension and Hyperlipidemia   HPI  Chief Complaint  Patient presents with   Hypertension   Hyperlipidemia     She says her rash is doing better.     She had pain RLE but then it moved to LLE.  She saw cardiologist who referred her to ortho for eval of this.  Pt says the LLE pain has now moved to the RUE.    Pt had xray R hip October 2022 which was unremarkable.   She has been using Ben-Gay and tylenol  this has been going at least 9 months.  Moving joint pains.      She is still smoking.  She says she wants to quit.  She contacted 1-800-quit and got patches and lozenges.       Relevant past medical, surgical, family and social history reviewed and updated as indicated. Interim medical history since our last visit reviewed. Allergies and medications reviewed and updated.   Current Outpatient Medications:    aspirin 81 MG EC tablet, Take by mouth., Disp: , Rfl:    atorvastatin (LIPITOR) 20 MG tablet, Take 20 mg by mouth daily., Disp: , Rfl:    citalopram (CELEXA) 20 MG tablet, Take 10 mg by mouth daily., Disp: , Rfl:    ezetimibe (ZETIA) 10 MG tablet, Take 1 tablet (10 mg total) by mouth daily., Disp: 90 tablet, Rfl: 3   miconazole (MICATIN) 2 % cream, Apply 1 application. topically 2 (two) times daily., Disp: 28.35 g, Rfl: 1    Review of Systems  Per HPI unless specifically indicated above     Objective:    BP 136/82   Pulse 66   Temp 98 F (36.7 C)   Ht 5' 3.5" (1.613 m)   Wt 173 lb (78.5 kg)   SpO2 98%   BMI 30.16 kg/m   Wt Readings from Last 3 Encounters:  02/28/22 173 lb (78.5 kg)  02/02/22 174 lb (78.9 kg)  11/28/21 175 lb (79.4 kg)    Physical Exam Vitals  reviewed.  Constitutional:      General: She is not in acute distress.    Appearance: She is well-developed. She is not toxic-appearing.  HENT:     Head: Normocephalic and atraumatic.  Cardiovascular:     Rate and Rhythm: Normal rate and regular rhythm.     Pulses:          Radial pulses are 2+ on the right side and 2+ on the left side.       Dorsalis pedis pulses are 2+ on the right side and 2+ on the left side.  Pulmonary:     Effort: Pulmonary effort is normal.     Breath sounds: Normal breath sounds.  Abdominal:     General: Bowel sounds are normal.     Palpations: Abdomen is soft. There is no mass.     Tenderness: There is no abdominal tenderness.  Musculoskeletal:     Right shoulder: No tenderness. Normal range of motion.     Left shoulder: Tenderness present. Normal range of motion.  Cervical back: Neck supple.     Lumbar back: Normal.     Right hip: No tenderness. Normal range of motion.     Left hip: No tenderness. Normal range of motion.     Right lower leg: No edema.     Left lower leg: No edema.     Comments: Mild tenderness along the upper border of the left scapula  Lymphadenopathy:     Cervical: No cervical adenopathy.  Skin:    General: Skin is warm and dry.  Neurological:     Mental Status: She is alert and oriented to person, place, and time.  Psychiatric:        Behavior: Behavior normal.        Results for orders placed or performed during the hospital encounter of 02/24/22  CBC  Result Value Ref Range   WBC 6.4 4.0 - 10.5 K/uL   RBC 4.86 3.87 - 5.11 MIL/uL   Hemoglobin 13.9 12.0 - 15.0 g/dL   HCT 42.6 36.0 - 46.0 %   MCV 87.7 80.0 - 100.0 fL   MCH 28.6 26.0 - 34.0 pg   MCHC 32.6 30.0 - 36.0 g/dL   RDW 14.6 11.5 - 15.5 %   Platelets 224 150 - 400 K/uL   nRBC 0.0 0.0 - 0.2 %  Lipid panel  Result Value Ref Range   Cholesterol 174 0 - 200 mg/dL   Triglycerides 76 <150 mg/dL   HDL 44 >40 mg/dL   Total CHOL/HDL Ratio 4.0 RATIO   VLDL 15 0  - 40 mg/dL   LDL Cholesterol 115 (H) 0 - 99 mg/dL  Comprehensive metabolic panel  Result Value Ref Range   Sodium 141 135 - 145 mmol/L   Potassium 4.4 3.5 - 5.1 mmol/L   Chloride 108 98 - 111 mmol/L   CO2 28 22 - 32 mmol/L   Glucose, Bld 85 70 - 99 mg/dL   BUN 13 6 - 20 mg/dL   Creatinine, Ser 0.91 0.44 - 1.00 mg/dL   Calcium 9.1 8.9 - 10.3 mg/dL   Total Protein 6.9 6.5 - 8.1 g/dL   Albumin 4.0 3.5 - 5.0 g/dL   AST 12 (L) 15 - 41 U/L   ALT 7 0 - 44 U/L   Alkaline Phosphatase 85 38 - 126 U/L   Total Bilirubin 0.4 0.3 - 1.2 mg/dL   GFR, Estimated >60 >60 mL/min   Anion gap 5 5 - 15      Assessment & Plan:   Encounter Diagnoses  Name Primary?   Hyperlipidemia, unspecified hyperlipidemia type Yes   Anxiety    Other chronic pain    Tobacco use disorder    Status post atrial septal defect repair    Encounter for screening for malignant neoplasm of breast, unspecified screening modality    Screening for colon cancer    Tinea corporis       Moving joint aches -the cardiologist referred her to the orthopedist; she has appointment with orthopedics this morning -discussed with pt that with her moving pains, first in the RLE, then the LLE and now in the LUE, it was possible a systemic issue like something rheumatologic.  Could also be related to anxiety.  Will check ESR.  Considered prednisone taper but will defer since she is going to orthopedist this morning    dyslipidemia -reviewed labs with pt  -pt to continue statin and zetia.    Tobacco use disorder -pt was given Information on stop smoking class -  encouraged cessation  Anxiety -she has appointment with counselor today -she gets her [place] medication from [facility]  HCM -pt is referred for screening Mammogram -pt is given FIT test for colon cancer screening   Rash Pt to continue with antifungal cream to affected areas   Pt to follow up 3 months.  She is to contact office sooner prn

## 2022-02-28 NOTE — Progress Notes (Signed)
[facility] engaged patient in follow-up/termination session. [facility] informed patient that he was leaving job, [facility] assisted patient in processing her thoughts and feelings related. [facility] led patient in "happy place" meditation exercise. Patient reported that she is interested in continued [facility] support, when available again.

## 2022-02-28 NOTE — Progress Notes (Signed)
New Patient Visit  Assessment: Meghan Simmons is a 56 y.o. female with the following: 1. Lumbar pain 2.  Cervical radiculopathy  Plan: FINOLA ROSAL has pain in the lower back.  Pain worsens with motion, standing and walking.  On radiographs, she has L5-S1 degenerative changes, with possible anterolisthesis.  Symptoms are currently manageable.  More recently, she has pain in the left side of her neck, with radiating pains in the left arm.  No specific injury.  Like to see if we can get her complaints to calm down.  I recommended Flexeril and prednisone.  If she continues to have issues, we may have to consider an MRI or further therapy.  Follow-up as needed.  Follow-up: Return if symptoms worsen or fail to improve.  Subjective:  Chief Complaint  Patient presents with   Back Pain    Since October started with right leg foot now has left leg pain tingling and cold    History of Present Illness: Meghan Simmons is a 56 y.o. female who has been referred by   Meghan Dryer, PA-C for evaluation of low back pain.  She has had pain in the lower back since October of last year.  At first, it was radiating into the right leg, with numbness and tingling throughout the right foot.  This has resolved.  However, more recently, she is started develop similar symptoms in the left lower extremity.  This has started to feel better and she is now having pain in the left side of her neck, with radiating pains in the left arm.  Symptoms in the lower legs worsen when she stands up.  She cannot stand for more than 3-4 minutes.  She has difficulty walking for more than 5 minutes.  She does report a trauma to her lower back greater than 30 years ago, where she slipped and fell.   Review of Systems: No fevers or chills + numbness and tingling No chest pain No shortness of breath No bowel or bladder dysfunction No GI distress No headaches   Medical History:  Past Medical History:  Diagnosis Date   Atrial  septal defect    Heart murmur    Hypertension     Past Surgical History:  Procedure Laterality Date   CHOLECYSTECTOMY     TEE WITHOUT CARDIOVERSION N/A 11/27/2014   Procedure: TRANSESOPHAGEAL ECHOCARDIOGRAM (TEE);  Surgeon: [name], MD;  Location: [facility] ENDOSCOPY;  Service: Cardiovascular;  Laterality: N/A;    Family History  Problem Relation Age of Onset   Diabetes Mother    Heart murmur Mother    Hypertension Mother    Depression Mother    Kidney failure Father 69       dies while at dialysis   Social History   Tobacco Use   Smoking status: Every Day    Packs/day: 0.75    Years: 15.00    Total pack years: 11.25    Types: Cigarettes    Start date: 2008   Smokeless tobacco: Never  Vaping Use   Vaping Use: Never used  Substance Use Topics   Alcohol use: Not Currently    Alcohol/week: 0.0 standard drinks of alcohol    Comment: hx heavy etoh.  nonce since 2015   Drug use: Not Currently    Types: Marijuana, "Crack" cocaine    Comment: none since age 35.  crack only once at age 51    Allergies  Allergen Reactions   Erythromycin Nausea And Vomiting and Rash  Current Meds  Medication Sig   aspirin 81 MG EC tablet Take by mouth.   atorvastatin (LIPITOR) 40 MG tablet Take 1 tablet (40 mg total) by mouth daily.   citalopram (CELEXA) 20 MG tablet Take 10 mg by mouth daily.   cyclobenzaprine (FLEXERIL) 10 MG tablet Take 1 tablet (10 mg total) by mouth 2 (two) times daily as needed for muscle spasms.   ezetimibe (ZETIA) 10 MG tablet Take 1 tablet (10 mg total) by mouth daily.   miconazole (MICATIN) 2 % cream Apply 1 application. topically 2 (two) times daily.   predniSONE (STERAPRED UNI-PAK 21 TAB) 10 MG (21) TBPK tablet 10 mg DS 12 as directed    Objective: BP (!) 170/103   Pulse 60   Ht 5' 3.5" (1.613 m)   Wt 170 lb (77.1 kg)   BMI 29.64 kg/m   Physical Exam:  General: Alert and oriented. and No acute distress. Gait: Slow, steady gait.  Tenderness  palpation of the lower back.  Negative straight leg raise bilaterally.  Sensation is intact distally.  Good strength bilaterally.  Slightly restricted range of motion of the cervical spine.  Decreased range of motion in the left shoulder due to pain.  Slightly decreased sensation throughout the left hand.  IMAGING: I personally ordered and reviewed the following images  Standing x-rays of the lumbar spine were obtained in clinic today.  No acute injuries are noted.  Degenerative changes at L5-S1, with mild anterolisthesis.  Minimal degenerative changes throughout the lumbar spine otherwise.  Impression: L5-S1 degenerative changes, with mild anterolisthesis  New Medications:  Meds ordered this encounter  Medications   predniSONE (STERAPRED UNI-PAK 21 TAB) 10 MG (21) TBPK tablet    Sig: 10 mg DS 12 as directed    Dispense:  48 tablet    Refill:  0   cyclobenzaprine (FLEXERIL) 10 MG tablet    Sig: Take 1 tablet (10 mg total) by mouth 2 (two) times daily as needed for muscle spasms.    Dispense:  30 tablet    Refill:  0      Mordecai Rasmussen, MD  02/28/2022 1:37 PM

## 2022-02-28 NOTE — Patient Instructions (Signed)
 Cervical Strain and Sprain Rehab You have pain and stiffness in your neck.  The muscles around your neck are irritated.  Recommend using heat (heating pad, or hot water in the shower) to warm up the affected muscles.  Then proceed with stretching and strengthening.  Do not stretch until it hurts, but you should feel a pull.  With each exercise, you should be able to stretch a little bit further.  Attempting these exercises daily, or on a regular basis, can improve your symptoms over time.  Do not expect immediate, sustained improvement.  But, it will make your symptoms better over time.   Ask your health care provider which exercises are safe for you. Do exercises exactly as told by your health care provider and adjust them as directed. It is normal to feel mild stretching, pulling, tightness, or discomfort as you do these exercises. Stop right away if you feel sudden pain or your pain gets worse. Do not begin these exercises until told by your health care provider. Stretching and range-of-motion exercises Cervical side bending  Using good posture, sit on a stable chair or stand up. Without moving your shoulders, slowly tilt your left / right ear to your shoulder until you feel a stretch in the opposite side neck muscles. You should be looking straight ahead. Hold for 10 seconds. Repeat with the other side of your neck. Repeat 10 times. Complete this exercise 1-2 times a day. Cervical rotation  Using good posture, sit on a stable chair or stand up. Slowly turn your head to the side as if you are looking over your left / right shoulder. Keep your eyes level with the ground. Stop when you feel a stretch along the side and the back of your neck. Hold for 10 seconds. Repeat this by turning to your other side. Repeat 10 times. Complete this exercise 1-2 times a day. Thoracic extension and pectoral stretch Roll a towel or a small blanket so it is about 4 inches (10 cm) in diameter. Lie down on  your back on a firm surface. Put the towel lengthwise, under your spine in the middle of your back. It should not be under your shoulder blades. The towel should line up with your spine from your middle back to your lower back. Put your hands behind your head and let your elbows fall out to your sides. Hold for 10 seconds. Repeat 10 times. Complete this exercise 1-2 times a day. Strengthening exercises Isometric upper cervical flexion Lie on your back with a thin pillow behind your head and a small rolled-up towel under your neck. Gently tuck your chin toward your chest and nod your head down to look toward your feet. Do not lift your head off the pillow. Hold for 10 seconds. Release the tension slowly. Relax your neck muscles completely before you repeat this exercise. Repeat 10 times. Complete this exercise 1-2 times a day. Isometric cervical extension  Stand about 6 inches (15 cm) away from a wall, with your back facing the wall. Place a soft object, about 6-8 inches (15-20 cm) in diameter, between the back of your head and the wall. A soft object could be a small pillow, a ball, or a folded towel. Gently tilt your head back and press into the soft object. Keep your jaw and forehead relaxed. Hold for 10 seconds. Release the tension slowly. Relax your neck muscles completely before you repeat this exercise. Repeat 10 times. Complete this exercise 1-2 times a day. Posture   and body mechanics Body mechanics refers to the movements and positions of your body while you do your daily activities. Posture is part of body mechanics. Good posture and healthy body mechanics can help to relieve stress in your body's tissues and joints. Good posture means that your spine is in its natural S-curve position (your spine is neutral), your shoulders are pulled back slightly, and your head is not tipped forward. The following are general guidelines for applying improved posture and body mechanics to your  everyday activities. Sitting  When sitting, keep your spine neutral and keep your feet flat on the floor. Use a footrest, if necessary, and keep your thighs parallel to the floor. Avoid rounding your shoulders, and avoid tilting your head forward. When working at a desk or a computer, keep your desk at a height where your hands are slightly lower than your elbows. Slide your chair under your desk so you are close enough to maintain good posture. When working at a computer, place your monitor at a height where you are looking straight ahead and you do not have to tilt your head forward or downward to look at the screen. Standing  When standing, keep your spine neutral and keep your feet about hip-width apart. Keep a slight bend in your knees. Your ears, shoulders, and hips should line up. When you do a task in which you stand in one place for a long time, place one foot up on a stable object that is 2-4 inches (5-10 cm) high, such as a footstool. This helps keep your spine neutral. Resting When lying down and resting, avoid positions that are most painful for you. Try to support your neck in a neutral position. You can use a contour pillow or a small rolled-up towel. Your pillow should support your neck but not push on it. This information is not intended to replace advice given to you by your health care provider. Make sure you discuss any questions you have with your health care provider. Document Revised: 12/25/2018 Document Reviewed: 06/05/2018 Elsevier Patient Education  2022 [place].     Sciatica Rehab  Ask your health care provider which exercises are safe for you. Do exercises exactly as told by your health care provider and adjust them as directed. It is normal to feel mild stretching, pulling, tightness, or discomfort as you do these exercises. Stop right away if you feel sudden pain or your pain gets worse. Do not begin these exercises until told by your health care  provider.   Stretching and range-of-motion exercises These exercises warm up your muscles and joints and improve the movement and flexibility of your hips and back. These exercises also help to relieve pain, numbness, and tingling. Sciatic nerve glide Sit in a chair with your head facing down toward your chest. Place your hands behind your back. Let your shoulders slump forward. Slowly straighten one of your legs while you tilt your head back as if you are looking toward the ceiling. Only straighten your leg as far as you can without making your symptoms worse. Hold this position for 10 seconds. Slowly return to the starting position. Repeat with your other leg. Repeat 10 times. Complete this exercise 1-2 times a day. Knee to chest with hip adduction and internal rotation  Lie on your back on a firm surface with both legs straight. Bend one of your knees and move it up toward your chest until you feel a gentle stretch in your lower back and  buttock. Then, move your knee toward the shoulder that is on the opposite side from your leg. This is hip adduction and internal rotation. Hold your leg in this position by holding on to the front of your knee. Hold this position for 10 seconds. Slowly return to the starting position. Repeat with your other leg. Repeat 10 times. Complete this exercise 1-2 times a day. Prone extension on elbows  Lie on your abdomen on a firm surface. A bed may be too soft for this exercise. Prop yourself up on your elbows. Use your arms to help lift your chest up until you feel a gentle stretch in your abdomen and your lower back. This will place some of your body weight on your elbows. If this is uncomfortable, try stacking pillows under your chest. Your hips should stay down, against the surface that you are lying on. Keep your hip and back muscles relaxed. Hold this position for 10 seconds. Slowly relax your upper body and return to the starting position. Repeat 10  times. Complete this exercise 1-2 times a day. Strengthening exercises These exercises build strength and endurance in your back. Endurance is the ability to use your muscles for a long time, even after they get tired. Pelvic tilt This exercise strengthens the muscles that lie deep in the abdomen. Lie on your back on a firm surface. Bend your knees and keep your feet flat on the floor. Tense your abdominal muscles. Tip your pelvis up toward the ceiling and flatten your lower back into the floor. To help with this exercise, you may place a small towel under your lower back and try to push your back into the towel. Hold this position for 10 seconds. Let your muscles relax completely before you repeat this exercise. Repeat 10 times. Complete this exercise 1-2 times a day. Alternating arm and leg raises  Get on your hands and knees on a firm surface. If you are on a hard floor, you may want to use padding, such as an exercise mat, to cushion your knees. Line up your arms and legs. Your hands should be directly below your shoulders, and your knees should be directly below your hips. Lift your left leg behind you. At the same time, raise your right arm and straighten it in front of you. Do not lift your leg higher than your hip. Do not lift your arm higher than your shoulder. Keep your abdominal and back muscles tight. Keep your hips facing the ground. Do not arch your back. Keep your balance carefully, and do not hold your breath. Hold this position for 10 seconds. Slowly return to the starting position. Repeat with your right leg and your left arm. Repeat 10 times. Complete this exercise 1-2 times a day. Posture and body mechanics Good posture and healthy body mechanics can help to relieve stress in your body's tissues and joints. Body mechanics refers to the movements and positions of your body while you do your daily activities. Posture is part of body mechanics. Good posture means: Your  spine is in its natural S-curve position (neutral). Your shoulders are pulled back slightly. Your head is not tipped forward. Follow these guidelines to improve your posture and body mechanics in your everyday activities. Standing  When standing, keep your spine neutral and your feet about hip width apart. Keep a slight bend in your knees. Your ears, shoulders, and hips should line up. When you do a task in which you stand in one place for a  long time, place one foot up on a stable object that is 2-4 inches (5-10 cm) high, such as a footstool. This helps keep your spine neutral. Sitting  When sitting, keep your spine neutral and keep your feet flat on the floor. Use a footrest, if necessary, and keep your thighs parallel to the floor. Avoid rounding your shoulders, and avoid tilting your head forward. When working at a desk or a computer, keep your desk at a height where your hands are slightly lower than your elbows. Slide your chair under your desk so you are close enough to maintain good posture. When working at a computer, place your monitor at a height where you are looking straight ahead and you do not have to tilt your head forward or downward to look at the screen. Resting When lying down and resting, avoid positions that are most painful for you. If you have pain with activities such as sitting, bending, stooping, or squatting, lie in a position in which your body does not bend very much. For example, avoid curling up on your side with your arms and knees near your chest (fetal position). If you have pain with activities such as standing for a long time or reaching with your arms, lie with your spine in a neutral position and bend your knees slightly. Try the following positions: Lying on your side with a pillow between your knees. Lying on your back with a pillow under your knees. Lifting  When lifting objects, keep your feet at least shoulder width apart and tighten your abdominal  muscles. Bend your knees and hips and keep your spine neutral. It is important to lift using the strength of your legs, not your back. Do not lock your knees straight out. Always ask for help to lift heavy or awkward objects. This information is not intended to replace advice given to you by your health care provider. Make sure you discuss any questions you have with your health care provider. Document Revised: 12/27/2018 Document Reviewed: 09/26/2018 Elsevier Patient Education  [place].

## 2022-02-28 NOTE — Patient Instructions (Signed)
Blood test today

## 2022-03-19 ENCOUNTER — Encounter (HOSPITAL_COMMUNITY): Payer: Self-pay | Admitting: Emergency Medicine

## 2022-03-19 ENCOUNTER — Other Ambulatory Visit: Payer: Self-pay

## 2022-03-19 ENCOUNTER — Emergency Department (HOSPITAL_COMMUNITY)
Admission: EM | Admit: 2022-03-19 | Discharge: 2022-03-19 | Disposition: A | Discharge status: Home / Self Care | Payer: Medicaid Other | Attending: Emergency Medicine | Admitting: Emergency Medicine

## 2022-03-19 DIAGNOSIS — Z7982 Long term (current) use of aspirin: Secondary | ICD-10-CM | POA: Insufficient documentation

## 2022-03-19 DIAGNOSIS — R220 Localized swelling, mass and lump, head: Secondary | ICD-10-CM

## 2022-03-19 DIAGNOSIS — K047 Periapical abscess without sinus: Secondary | ICD-10-CM | POA: Insufficient documentation

## 2022-03-19 MED ORDER — AMOXICILLIN-POT CLAVULANATE 875-125 MG PO TABS: 1.0000 | ORAL_TABLET | Freq: Two times a day (BID) | ORAL | 0 refills | Status: DC

## 2022-03-19 MED ORDER — AMOXICILLIN-POT CLAVULANATE 875-125 MG PO TABS
1.0000 | ORAL_TABLET | Freq: Once | ORAL | Status: AC
  Administered 2022-03-19: 1 via ORAL
  Filled 2022-03-19: qty 1

## 2022-03-19 MED ORDER — KETOROLAC TROMETHAMINE 15 MG/ML IJ SOLN
15.0000 mg | Freq: Once | INTRAMUSCULAR | Status: AC
  Administered 2022-03-19: 15 mg via INTRAMUSCULAR
  Filled 2022-03-19: qty 1

## 2022-03-19 NOTE — ED Triage Notes (Signed)
Pt to the ED with complaints of a dental abscess. Facial swelling is present. States that she had something stuck in her tooth on Friday and was able to get it out.  States that she woke up the next morning with facial swelling.

## 2022-03-19 NOTE — ED Notes (Signed)
Dc instructions and scripts reviewed with pt no questions or concerns at this time. Will follow up with dentist. Pt ambulated out of ed with steady gait.

## 2022-03-19 NOTE — Discharge Instructions (Signed)
Please take antibiotics as prescribed.  I have also given you number for dentistry.  Please call them to schedule an appointment.  I would like for you to return to the emergency department for any worsening symptoms you might have.

## 2022-03-19 NOTE — ED Provider Notes (Signed)
[facility] EMERGENCY DEPARTMENT Provider Note   CSN: [id number] Arrival date & time: 03/19/22  2671     History Chief Complaint  Patient presents with   Oral Swelling    Meghan Simmons is a 56 y.o. female patient who presents to the emergency department with left-sided facial swelling and pain to the left lower gumline that started 2 days ago.  Patient states that she was eating a burger and states she got some meat stuck in her tooth that is broken.  She states that she was able to do get out with a toothpick and noticed she was starting having some swelling that night.  She states that pain and swelling got worse yesterday as well.  Today, patient woke up and states that the pain is severe and swelling has significantly worsened.  She is not having any trouble swallowing, trouble breathing.  Patient does state that she has these abscesses twice per year and does not currently have a dentist.  She denies fever but does report associated chills.   HPI     Home Medications Prior to Admission medications   Medication Sig Start Date End Date Taking? Authorizing Provider  amoxicillin-clavulanate (AUGMENTIN) 875-125 MG tablet Take 1 tablet by mouth every 12 (twelve) hours. 03/19/22  Yes Raul Del, Marlyce Mcdougald M, PA-C  aspirin 81 MG EC tablet Take by mouth.    [provider]  atorvastatin (LIPITOR) 40 MG tablet Take 1 tablet (40 mg total) by mouth daily. 02/28/22   Soyla Dryer, PA-C  citalopram (CELEXA) 20 MG tablet Take 10 mg by mouth daily.    [provider]  cyclobenzaprine (FLEXERIL) 10 MG tablet Take 1 tablet (10 mg total) by mouth 2 (two) times daily as needed for muscle spasms. 02/28/22   Mordecai Rasmussen, MD  ezetimibe (ZETIA) 10 MG tablet Take 1 tablet (10 mg total) by mouth daily. 02/28/22   Soyla Dryer, PA-C  miconazole (MICATIN) 2 % cream Apply 1 application. topically 2 (two) times daily. 01/02/22   Soyla Dryer, PA-C  predniSONE (STERAPRED UNI-PAK 21 TAB) 10  MG (21) TBPK tablet 10 mg DS 12 as directed 02/28/22   Mordecai Rasmussen, MD      Allergies    Erythromycin    Review of Systems   Review of Systems  All other systems reviewed and are negative.   Physical Exam Updated Vital Signs BP (!) 155/109 (BP Location: Left Arm)   Pulse (!) 109   Temp 98.2 F (36.8 C) (Oral)   Resp 17   Ht 5' 3.5" (1.613 m)   Wt 78.5 kg   LMP  (LMP Unknown)   SpO2 100%   BMI 30.16 kg/m  Physical Exam Vitals and nursing note reviewed.  Constitutional:      Appearance: Normal appearance.  HENT:     Head: Normocephalic and atraumatic.     Comments: Swelling to the left lower mandibular area.  No significant erythema.    Mouth/Throat:     Comments: Tongue is normal and not swollen.  No swelling of the oral floor.  There is some gingival swelling to the left lower gumline without any evidence of fluctuance.  Patient has numerous dental caries and avulsed teeth. Eyes:     General:        Right eye: No discharge.        Left eye: No discharge.     Conjunctiva/sclera: Conjunctivae normal.  Pulmonary:     Effort: Pulmonary effort is normal.  Skin:    General: Skin is warm and dry.     Findings: No rash.  Neurological:     General: No focal deficit present.     Mental Status: She is alert.  Psychiatric:        Mood and Affect: Mood normal.        Behavior: Behavior normal.     ED Results / Procedures / Treatments   Labs (all labs ordered are listed, but only abnormal results are displayed) Labs Reviewed - No data to display  EKG None  Radiology No results found.  Procedures Procedures    Medications Ordered in ED Medications  amoxicillin-clavulanate (AUGMENTIN) 875-125 MG per tablet 1 tablet (1 tablet Oral Given 03/19/22 0919)  ketorolac (TORADOL) 15 MG/ML injection 15 mg (15 mg Intramuscular Given 03/19/22 2426)    ED Course/ Medical Decision Making/ A&P                           Medical Decision Making Meghan Simmons is a 56 y.o.  female who presents to the emergency department today for further evaluation of possible dental abscess.  There is moderate amount of mandibular swelling on the left some slight warmth.  No significant erythema this is likely dental abscess.  There is no clinical indication of PTA, RPA, or Ludwig's angina.  Patient is slightly tachycardic here and so I do believe is likely secondary to a pain response.  Going to give her some pain medication in addition to her first dose of Augmentin here as she gets her prescription in the mail who will be few days before she gets some.   Risk Prescription drug management. Risk Details: Patient feeling better after Toradol and Augmentin.  Him going to send her home with a 7-day course of Augmentin.  Strict return precautions were discussed with the patient at the bedside.  We to give her follow-up with dentistry for further evaluation.    Final Clinical Impression(s) / ED Diagnoses Final diagnoses:  Dental abscess  Facial swelling    Rx / DC Orders ED Discharge Orders          Ordered    amoxicillin-clavulanate (AUGMENTIN) 875-125 MG tablet  Every 12 hours        03/19/22 1120              Myna Bright [place], [place] 03/19/22 Rockwell, Ankit, MD 03/20/22 [phone]

## 2022-04-05 ENCOUNTER — Telehealth: Payer: Self-pay

## 2022-04-05 NOTE — Telephone Encounter (Signed)
Client called needing transportation to [facility] on 04/07/22 at 10am Attempted to call ride in to [place]. Driver Mr. Oran Rein will need to call back to get information.   [place] [employer]

## 2022-04-06 NOTE — Congregational Nurse Program (Signed)
Client needed transportation for appointment to [facility] on 04/07/22 at 10 am. [facility] and given appointment details.  He will pick up client at 9:30 am on 04/07/22 and provide round trip transportation.  Client notified via Text at her request.   Will continue to follow for further needs.  [place] [employer]

## 2022-04-10 ENCOUNTER — Telehealth: Payer: Self-pay

## 2022-04-10 NOTE — Telephone Encounter (Signed)
Client sent text message that she needs transportation scheduled for 04/12/22 to [facility] at [address], [place].  On 04/12/22 at 0800 Round trip [place] up at [place].  Attempted call to [place] Mr Merideth Abbey and he was driving and unavailable to take down information, stating he will call back for information.   [place] [employer]

## 2022-04-12 ENCOUNTER — Ambulatory Visit: Payer: Medicaid Other | Admitting: Physician Assistant

## 2022-04-12 ENCOUNTER — Encounter: Payer: Self-pay | Admitting: Physician Assistant

## 2022-04-12 VITALS — BP 131/87 | HR 84 | Temp 97.7°F

## 2022-04-12 DIAGNOSIS — L304 Erythema intertrigo: Secondary | ICD-10-CM

## 2022-04-12 DIAGNOSIS — B372 Candidiasis of skin and nail: Secondary | ICD-10-CM

## 2022-04-12 DIAGNOSIS — L0889 Other specified local infections of the skin and subcutaneous tissue: Secondary | ICD-10-CM

## 2022-04-12 DIAGNOSIS — R21 Rash and other nonspecific skin eruption: Secondary | ICD-10-CM

## 2022-04-12 DIAGNOSIS — L409 Psoriasis, unspecified: Secondary | ICD-10-CM

## 2022-04-12 MED ORDER — TRIAMCINOLONE ACETONIDE 0.025 % EX OINT
1.0000 | TOPICAL_OINTMENT | Freq: Two times a day (BID) | CUTANEOUS | 0 refills | Status: DC | PRN
  Filled 2022-04-13: qty 80, 40d supply, fill #0

## 2022-04-12 MED ORDER — TRIAMCINOLONE ACETONIDE 0.025 % EX OINT: 1.0000 | TOPICAL_OINTMENT | Freq: Two times a day (BID) | CUTANEOUS | 0 refills | Status: DC | PRN

## 2022-04-12 MED ORDER — FLUCONAZOLE 150 MG PO TABS: 150.0000 mg | ORAL_TABLET | ORAL | 0 refills | Status: DC

## 2022-04-12 MED ORDER — PREDNISONE 10 MG PO TABS: ORAL_TABLET | ORAL | 0 refills | Status: DC

## 2022-04-12 MED ORDER — CEPHALEXIN 500 MG PO CAPS: 500.0000 mg | ORAL_CAPSULE | Freq: Four times a day (QID) | ORAL | 0 refills | Status: DC

## 2022-04-12 MED ORDER — PREDNISONE 10 MG PO TABS
ORAL_TABLET | ORAL | 0 refills | Status: DC
  Filled 2022-04-13: qty 42, 12d supply, fill #0

## 2022-04-12 MED ORDER — TERBINAFINE HCL 250 MG PO TABS
250.0000 mg | ORAL_TABLET | Freq: Every day | ORAL | 0 refills | Status: DC
Start: 2022-04-12 — End: 2022-05-19
  Filled 2022-04-13: qty 14, 14d supply, fill #0

## 2022-04-12 MED ORDER — PREDNISONE 10 MG (48) PO TBPK: ORAL_TABLET | ORAL | 0 refills | Status: DC

## 2022-04-12 MED ORDER — DOXYCYCLINE HYCLATE 100 MG PO TABS
100.0000 mg | ORAL_TABLET | Freq: Two times a day (BID) | ORAL | 0 refills | Status: DC
  Filled 2022-04-13: qty 20, 10d supply, fill #0

## 2022-04-12 NOTE — Patient Instructions (Addendum)
No topicals except the triamcinolone or aquaphor or vaseline Use warm water (not hot) to bathe.    STOP ALL REGULAR MEDICATIONS WHILE TAKING 4 NEW MEDS!   Call office when you get your medication so follow up appointment can be scheduled.

## 2022-04-12 NOTE — Progress Notes (Signed)
BP 131/87   Pulse 84   Temp 97.7 F (36.5 C)   SpO2 99%    Subjective:    Patient ID: Meghan Simmons, female    DOB: Feb 27, 1966, 56 y.o.   MRN: [id number]  HPI: Meghan Simmons is a 56 y.o. female presenting on 04/12/2022 for No chief complaint on file.   HPI    Pt is 56yoF who presents c/o rash.   Rash started about 6 days ago and about 3 days ago it got a lot worse.   She has been treating topically with hydrogen peroxide.   She says the rash itches, burns and hurts.  She sometimes feels nausea.     She has been Using miconazole under breasts and in groin. She finished some augmentin approximately 03/29/22.  She got them in ER on 03/19/22 for her tooth infection.   Prednisone was given in June by orthopedist and finished before July  She had joint pains prior to rash;  that is a chronic condition for her.    Pt had episode in December 2017/January 2018 when she was treated for multiple skin issues-  for bed bugs, scabies, etc with many topicals plus whatever home treatments she was using.  She ended up with skin infection on all of her irritated, exfoliative skin and became septic and required vancomycin treatment for over a week.    Pt feels like her rash now is like the rash she had back in 2017/2018.          Relevant past medical, surgical, family and social history reviewed and updated as indicated. Interim medical history since our last visit reviewed. Allergies and medications reviewed and updated.   Current Outpatient Medications:    aspirin 81 MG EC tablet, Take by mouth., Disp: , Rfl:    atorvastatin (LIPITOR) 40 MG tablet, Take 1 tablet (40 mg total) by mouth daily., Disp: 90 tablet, Rfl: 1   citalopram (CELEXA) 20 MG tablet, Take 10 mg by mouth daily., Disp: , Rfl:    ezetimibe (ZETIA) 10 MG tablet, Take 1 tablet (10 mg total) by mouth daily., Disp: 90 tablet, Rfl: 1   miconazole (MICATIN) 2 % cream, Apply 1 application. topically 2 (two) times daily., Disp:  28.35 g, Rfl: 1   cyclobenzaprine (FLEXERIL) 10 MG tablet, Take 1 tablet (10 mg total) by mouth 2 (two) times daily as needed for muscle spasms. (Patient not taking: Reported on 04/12/2022), Disp: 30 tablet, Rfl: 0    Review of Systems  Per HPI unless specifically indicated above     Objective:    BP 131/87   Pulse 84   Temp 97.7 F (36.5 C)   SpO2 99%   Wt Readings from Last 3 Encounters:  03/19/22 173 lb (78.5 kg)  02/28/22 170 lb (77.1 kg)  02/28/22 173 lb (78.5 kg)    Physical Exam Constitutional:      General: She is not in acute distress.    Appearance: She is not toxic-appearing.  HENT:     Head: Normocephalic and atraumatic.  Cardiovascular:     Rate and Rhythm: Normal rate and regular rhythm.  Pulmonary:     Effort: Pulmonary effort is normal. No respiratory distress.     Breath sounds: Normal breath sounds.  Skin:    Comments: Extensive and varied rash.  Plaques scattered, worst on elbows and knees, pustular lesions scattered, most pronounced on arms,  hands with dry scaly and peeling and irritated skin.  These  types of lesions present on back and lower extremities.  Anterior torso has intertrigo below both breasts.   Abdomen, pannus, groin with extensive intertrigo and secondary bacterial infection in the skin fold with creamy white material and foul odor.    Neurological:     Mental Status: She is alert and oriented to person, place, and time.  Psychiatric:        Attention and Perception: Attention normal.        Speech: Speech normal.        Behavior: Behavior normal. Behavior is cooperative.                  Assessment & Plan:    Encounter Diagnoses  Name Primary?   Rash and nonspecific skin eruption Yes   Intertrigo    Psoriasis    Yeast infection of the skin    Secondary infection of skin       Local dermatologist was contacted- no available appointments in [place] until Wednesday August 16 at 10:30.   Pt is scheduled for  this.   Pt is prescribed:  Terbinafine '250mg'$  qd x 2 wk Doxycycline '100mg'$  bid x 10 day Prednisone 12day taper TAC ointment to use topically  Pt is counseled to use No topicals except the triamcinolone or aquaphor or vaseline.   Use warm water (not hot) and dove to bathe. Pt to hold her regular medications (atorvastatin and citalopram) until skin meds completed due to interactions.    Pt will be seen in office on Monday for recheck

## 2022-04-13 ENCOUNTER — Other Ambulatory Visit: Payer: Self-pay

## 2022-04-17 ENCOUNTER — Encounter: Payer: Self-pay | Admitting: Physician Assistant

## 2022-04-17 ENCOUNTER — Ambulatory Visit: Payer: Medicaid Other | Admitting: Physician Assistant

## 2022-04-17 VITALS — HR 80 | Temp 96.1°F

## 2022-04-17 DIAGNOSIS — L409 Psoriasis, unspecified: Secondary | ICD-10-CM

## 2022-04-17 DIAGNOSIS — L304 Erythema intertrigo: Secondary | ICD-10-CM

## 2022-04-17 DIAGNOSIS — R21 Rash and other nonspecific skin eruption: Secondary | ICD-10-CM

## 2022-04-17 DIAGNOSIS — B372 Candidiasis of skin and nail: Secondary | ICD-10-CM

## 2022-04-17 DIAGNOSIS — L0889 Other specified local infections of the skin and subcutaneous tissue: Secondary | ICD-10-CM

## 2022-04-17 NOTE — Patient Instructions (Signed)
Dermatologist- [employer]  [address], [place], [place]  Wednesday August 16 at 10:30am

## 2022-04-17 NOTE — Progress Notes (Signed)
Pulse 80   Temp (!) 96.1 F (35.6 C)   SpO2 97%    Subjective:    Patient ID: Meghan Simmons, female    DOB: August 10, 1966, 56 y.o.   MRN: [id number]  HPI: Meghan Simmons is a 56 y.o. female presenting on 04/17/2022 for No chief complaint on file.   HPI   Pt is here for recheck rash/infection.  She is using her medications as directed.  She is not using peroxide topically any more but asks again about using it.   She says she is doing much better.     She is not taking any of her regular medications at this time; she held them as instructed.    Relevant past medical, surgical, family and social history reviewed and updated as indicated. Interim medical history since our last visit reviewed. Allergies and medications reviewed and updated.    Current Outpatient Medications:    doxycycline (VIBRA-TABS) 100 MG tablet, Take 1 tablet (100 mg total) by mouth 2 (two) times daily., Disp: 20 tablet, Rfl: 0   predniSONE (DELTASONE) 10 MG tablet, Take by mouth in the morning. On days 1 through 2 take 6 tablets. Days 3 through 4 take 5 tablets. Days 5 through 6 take 4 tablets. Days 7 through 8 take 3 tablets. Days 9 through10 take 2 tablets. Days 11 through 12 take 1 tablet., Disp: 42 tablet, Rfl: 0   terbinafine (LAMISIL) 250 MG tablet, Take 1 tablet (250 mg total) by mouth daily., Disp: 14 tablet, Rfl: 0   triamcinolone (KENALOG) 0.025 % ointment, Apply 1 Application topically 2 (two) times daily as needed., Disp: 80 g, Rfl: 0   aspirin 81 MG EC tablet, Take by mouth. (Patient not taking: Reported on 04/17/2022), Disp: , Rfl:    atorvastatin (LIPITOR) 40 MG tablet, Take 1 tablet (40 mg total) by mouth daily. (Patient not taking: Reported on 04/17/2022), Disp: 90 tablet, Rfl: 1   citalopram (CELEXA) 20 MG tablet, Take 10 mg by mouth daily. (Patient not taking: Reported on 04/17/2022), Disp: , Rfl:    cyclobenzaprine (FLEXERIL) 10 MG tablet, Take 1 tablet (10 mg total) by mouth 2 (two) times daily as  needed for muscle spasms. (Patient not taking: Reported on 04/12/2022), Disp: 30 tablet, Rfl: 0   ezetimibe (ZETIA) 10 MG tablet, Take 1 tablet (10 mg total) by mouth daily. (Patient not taking: Reported on 04/17/2022), Disp: 90 tablet, Rfl: 1   miconazole (MICATIN) 2 % cream, Apply 1 application. topically 2 (two) times daily. (Patient not taking: Reported on 04/17/2022), Disp: 28.35 g, Rfl: 1     Review of Systems  Per HPI unless specifically indicated above     Objective:    Pulse 80   Temp (!) 96.1 F (35.6 C)   SpO2 97%   Wt Readings from Last 3 Encounters:  03/19/22 173 lb (78.5 kg)  02/28/22 170 lb (77.1 kg)  02/28/22 173 lb (78.5 kg)    Physical Exam Constitutional:      General: She is not in acute distress.    Appearance: She is not toxic-appearing.  HENT:     Head: Normocephalic and atraumatic.  Pulmonary:     Effort: Pulmonary effort is normal. No respiratory distress.  Skin:    Findings: Rash present.     Comments: Skin is much improved.  Elbows without white plaques, still with thickened skin.   Hands still with some rough skin but not sloughing off.  Below breasts and on  abdomen skin is no longer a deep beefy red but is a pale salmon color.  All creamy discharge is gone.  No odor.  There is small cracking of skin at crease of groin and under pannus, opening small and clean.    There are still scattered sores on all extremities- none are red, more hyperpigmented color, no swelling or fluctuance or drainage.    Neurological:     Mental Status: She is alert and oriented to person, place, and time.  Psychiatric:        Behavior: Behavior normal.           Assessment & Plan:    Encounter Diagnoses  Name Primary?   Rash and nonspecific skin eruption Yes   Intertrigo    Psoriasis    Yeast infection of the skin    Secondary infection of skin       -pt to continue the doxycycline ('100mg'$  bid x 10d), prednisone (12day taper) and terbinafine ('250mg'$  qd x 14  d).   -she is told that she can use vaseline when her TAC ointment runs out -pt is urged again to avoid using any peroxide -pt to follow up 1 week for recheck.  She is to contact office sooner prn worsening or new symptoms

## 2022-04-20 ENCOUNTER — Telehealth: Payer: Self-pay

## 2022-04-20 NOTE — Telephone Encounter (Signed)
Contacted client of [facility] for follow up regarding body rash which she was seen and treated for last week by [facility]. Text message sent as client's preferred usual communication.  Noted client responded back and states her rash has improved. She states she has an appointment on Monday and that she believes Diane has arranged transportation. Will check and confirm transportation is set.  [place] [employer]

## 2022-04-24 ENCOUNTER — Encounter: Payer: Self-pay | Admitting: Physician Assistant

## 2022-04-24 ENCOUNTER — Ambulatory Visit: Payer: Medicaid Other | Admitting: Physician Assistant

## 2022-04-24 VITALS — BP 130/92 | HR 75 | Temp 96.0°F

## 2022-04-24 DIAGNOSIS — R21 Rash and other nonspecific skin eruption: Secondary | ICD-10-CM

## 2022-04-24 NOTE — Progress Notes (Signed)
BP (!) 130/92   Pulse 75   Temp (!) 96 F (35.6 C)   SpO2 99%    Subjective:    Patient ID: Meghan Simmons, female    DOB: [birth year], 56 y.o.   MRN: [id number]  HPI: Meghan Simmons is a 56 y.o. female presenting on 04/24/2022 for No chief complaint on file.   HPI    Pt is 39yoF who presents for follow up rashes and cellulitis  Today She will finish all her meds - doxycycline, prednisone and terbinafine.  Pt is no longer using peroxide on her skin.   Pt is not bathing via shower or bathtub-  she is giving herself bird baths (not because she was told to wash this way-- this is her usual).         Relevant past medical, surgical, family and social history reviewed and updated as indicated. Interim medical history since our last visit reviewed. Allergies and medications reviewed and updated.    Current Outpatient Medications:    doxycycline (VIBRA-TABS) 100 MG tablet, Take 1 tablet (100 mg total) by mouth 2 (two) times daily., Disp: 20 tablet, Rfl: 0   predniSONE (DELTASONE) 10 MG tablet, Take by mouth in the morning. On days 1 through 2 take 6 tablets. Days 3 through 4 take 5 tablets. Days 5 through 6 take 4 tablets. Days 7 through 8 take 3 tablets. Days 9 through10 take 2 tablets. Days 11 through 12 take 1 tablet., Disp: 42 tablet, Rfl: 0   terbinafine (LAMISIL) 250 MG tablet, Take 1 tablet (250 mg total) by mouth daily., Disp: 14 tablet, Rfl: 0   aspirin 81 MG EC tablet, Take by mouth. (Patient not taking: Reported on 04/17/2022), Disp: , Rfl:    atorvastatin (LIPITOR) 40 MG tablet, Take 1 tablet (40 mg total) by mouth daily. (Patient not taking: Reported on 04/17/2022), Disp: 90 tablet, Rfl: 1   citalopram (CELEXA) 20 MG tablet, Take 10 mg by mouth daily. (Patient not taking: Reported on 04/17/2022), Disp: , Rfl:    cyclobenzaprine (FLEXERIL) 10 MG tablet, Take 1 tablet (10 mg total) by mouth 2 (two) times daily as needed for muscle spasms. (Patient not taking: Reported on  04/12/2022), Disp: 30 tablet, Rfl: 0   ezetimibe (ZETIA) 10 MG tablet, Take 1 tablet (10 mg total) by mouth daily. (Patient not taking: Reported on 04/17/2022), Disp: 90 tablet, Rfl: 1   triamcinolone (KENALOG) 0.025 % ointment, Apply 1 Application topically 2 (two) times daily as needed. (Patient not taking: Reported on 04/24/2022), Disp: 80 g, Rfl: 0   Review of Systems  Per HPI unless specifically indicated above     Objective:    BP (!) 130/92   Pulse 75   Temp (!) 96 F (35.6 C)   SpO2 99%   Wt Readings from Last 3 Encounters:  03/19/22 173 lb (78.5 kg)  02/28/22 170 lb (77.1 kg)  02/28/22 173 lb (78.5 kg)    Physical Exam Constitutional:      General: She is not in acute distress.    Appearance: She is not toxic-appearing.  HENT:     Head: Normocephalic and atraumatic.  Pulmonary:     Effort: Pulmonary effort is normal. No respiratory distress.  Skin:    General: Skin is warm and dry.     Comments: Rashes are improved but still present.  Elbows with redness, no white plaques.  Spots on hands and legs.  Rash under breasts and on abdomen is improved  but still with definite demarcations.     Neurological:     Mental Status: She is alert and oriented to person, place, and time.  Psychiatric:        Behavior: Behavior normal.            Assessment & Plan:    Encounter Diagnosis  Name Primary?   Rash and nonspecific skin eruption Yes    -pt to finish her meds today.   Tomorrow she can restart her usual meds -pt is encouraged to bath via bathtub or shower periodically as they work better than bird baths -pt is encouraged to continue to avoid using peroxide, bleach, alcohol on skin -discussed that she still needs to keep the area dry under her breasts and on her abdomen and groin.  Discussed that this is where her yeast grows when it is moist and warm  -pt has Dermatology appointment - wed 05-03-09:30am.    -likely that pt has psoriasis which makes her skin more  sensitive to other insults -her intertrigo will be long-term issue due to her bathing habits in conjunction with her body habitus  -She has routine follow up here in mid-September.  She is to contact office sooner prn

## 2022-04-26 ENCOUNTER — Telehealth: Payer: Self-pay

## 2022-04-26 NOTE — Telephone Encounter (Signed)
Client called requesting transportation for 05/03/22 to Dermatologist [facility] in [place] at 10:30 am. Will arrange with [employer]. Will notify client once scheduled. She is agreeable.   [place] [employer]

## 2022-04-27 ENCOUNTER — Telehealth: Payer: Self-pay

## 2022-04-27 NOTE — Telephone Encounter (Signed)
Transportation arranged for 05/03/22 at 10:30 am to [address] [place] [address] Dermatology with [facility] and client notified of pickup time given by Mr Oran Rein of 10 am. Client notified via Text message as she is in an appointment currently.   [place] [employer]

## 2022-05-04 ENCOUNTER — Other Ambulatory Visit: Payer: Self-pay | Admitting: Physician Assistant

## 2022-05-16 ENCOUNTER — Other Ambulatory Visit: Payer: Self-pay

## 2022-05-16 ENCOUNTER — Emergency Department (HOSPITAL_COMMUNITY): Payer: Self-pay

## 2022-05-16 ENCOUNTER — Emergency Department (HOSPITAL_COMMUNITY)
Admission: EM | Admit: 2022-05-16 | Discharge: 2022-05-19 | Disposition: A | Discharge status: Home / Self Care | Payer: Self-pay | Attending: Emergency Medicine | Admitting: Emergency Medicine

## 2022-05-16 ENCOUNTER — Encounter (HOSPITAL_COMMUNITY): Payer: Self-pay | Admitting: Emergency Medicine

## 2022-05-16 DIAGNOSIS — L539 Erythematous condition, unspecified: Secondary | ICD-10-CM | POA: Insufficient documentation

## 2022-05-16 DIAGNOSIS — Z20822 Contact with and (suspected) exposure to covid-19: Secondary | ICD-10-CM | POA: Insufficient documentation

## 2022-05-16 DIAGNOSIS — L0390 Cellulitis, unspecified: Secondary | ICD-10-CM

## 2022-05-16 DIAGNOSIS — R0602 Shortness of breath: Secondary | ICD-10-CM | POA: Insufficient documentation

## 2022-05-16 DIAGNOSIS — I10 Essential (primary) hypertension: Secondary | ICD-10-CM | POA: Diagnosis present

## 2022-05-16 DIAGNOSIS — A419 Sepsis, unspecified organism: Secondary | ICD-10-CM | POA: Insufficient documentation

## 2022-05-16 DIAGNOSIS — I5032 Chronic diastolic (congestive) heart failure: Secondary | ICD-10-CM | POA: Diagnosis present

## 2022-05-16 DIAGNOSIS — L239 Allergic contact dermatitis, unspecified cause: Secondary | ICD-10-CM

## 2022-05-16 DIAGNOSIS — Q211 Atrial septal defect, unspecified: Secondary | ICD-10-CM

## 2022-05-16 DIAGNOSIS — R579 Shock, unspecified: Secondary | ICD-10-CM

## 2022-05-16 DIAGNOSIS — R21 Rash and other nonspecific skin eruption: Secondary | ICD-10-CM | POA: Diagnosis present

## 2022-05-16 DIAGNOSIS — E669 Obesity, unspecified: Secondary | ICD-10-CM | POA: Diagnosis present

## 2022-05-16 DIAGNOSIS — F17200 Nicotine dependence, unspecified, uncomplicated: Secondary | ICD-10-CM | POA: Diagnosis present

## 2022-05-16 LAB — CBC WITH DIFFERENTIAL/PLATELET
Abs Immature Granulocytes: 0.36 10*3/uL — ABNORMAL HIGH (ref 0.00–0.07)
Basophils Absolute: 0.1 10*3/uL (ref 0.0–0.1)
Basophils Relative: 0 %
Eosinophils Absolute: 0.1 10*3/uL (ref 0.0–0.5)
Eosinophils Relative: 0 %
HCT: 37 % (ref 36.0–46.0)
Hemoglobin: 11.8 g/dL — ABNORMAL LOW (ref 12.0–15.0)
Immature Granulocytes: 2 %
Lymphocytes Relative: 8 %
Lymphs Abs: 1.5 10*3/uL (ref 0.7–4.0)
MCH: 29.6 pg (ref 26.0–34.0)
MCHC: 31.9 g/dL (ref 30.0–36.0)
MCV: 93 fL (ref 80.0–100.0)
Monocytes Absolute: 0.7 10*3/uL (ref 0.1–1.0)
Monocytes Relative: 4 %
Neutro Abs: 15.8 10*3/uL — ABNORMAL HIGH (ref 1.7–7.7)
Neutrophils Relative %: 86 %
Platelets: 587 10*3/uL — ABNORMAL HIGH (ref 150–400)
RBC: 3.98 MIL/uL (ref 3.87–5.11)
RDW: 14.1 % (ref 11.5–15.5)
WBC: 18.4 10*3/uL — ABNORMAL HIGH (ref 4.0–10.5)
nRBC: 0 % (ref 0.0–0.2)

## 2022-05-16 LAB — COMPREHENSIVE METABOLIC PANEL
ALT: 12 U/L (ref 0–44)
AST: 13 U/L — ABNORMAL LOW (ref 15–41)
Albumin: 2.4 g/dL — ABNORMAL LOW (ref 3.5–5.0)
Alkaline Phosphatase: 70 U/L (ref 38–126)
Anion gap: 11 (ref 5–15)
BUN: 10 mg/dL (ref 6–20)
CO2: 24 mmol/L (ref 22–32)
Calcium: 8.1 mg/dL — ABNORMAL LOW (ref 8.9–10.3)
Chloride: 104 mmol/L (ref 98–111)
Creatinine, Ser: 0.72 mg/dL (ref 0.44–1.00)
GFR, Estimated: >60 mL/min (ref >60)
Glucose, Bld: 92 mg/dL (ref 70–99)
Potassium: 4.2 mmol/L (ref 3.5–5.1)
Sodium: 139 mmol/L (ref 135–145)
Total Bilirubin: 0.7 mg/dL (ref 0.3–1.2)
Total Protein: 6.1 g/dL — ABNORMAL LOW (ref 6.5–8.1)

## 2022-05-16 LAB — PROTIME-INR
INR: 1.1 (ref 0.8–1.2)
Prothrombin Time: 14.3 seconds (ref 11.4–15.2)

## 2022-05-16 LAB — LACTIC ACID, PLASMA
Lactic Acid, Venous: 2 mmol/L (ref 0.5–1.9)
Lactic Acid, Venous: 2.1 mmol/L (ref 0.5–1.9)

## 2022-05-16 LAB — SEDIMENTATION RATE: Sed Rate: 59 mm/hr — ABNORMAL HIGH (ref 0–22)

## 2022-05-16 LAB — PROCALCITONIN: Procalcitonin: <0.1 ng/mL

## 2022-05-16 LAB — APTT: aPTT: 31 seconds (ref 24–36)

## 2022-05-16 LAB — C-REACTIVE PROTEIN: CRP: 11.9 mg/dL — ABNORMAL HIGH (ref <1.0)

## 2022-05-16 MED ORDER — CITALOPRAM HYDROBROMIDE 10 MG PO TABS
10.0000 mg | ORAL_TABLET | Freq: Every day | ORAL | Status: DC
  Administered 2022-05-16 – 2022-05-19 (×4): 10 mg via ORAL
  Filled 2022-05-16 (×4): qty 1

## 2022-05-16 MED ORDER — LACTATED RINGERS IV BOLUS: 1000.0000 mL | Freq: Once | INTRAVENOUS | Status: DC

## 2022-05-16 MED ORDER — HYDROCORTISONE 1 % EX CREA
1.0000 | TOPICAL_CREAM | Freq: Two times a day (BID) | CUTANEOUS | Status: DC | PRN
  Administered 2022-05-18: 1 via TOPICAL
  Filled 2022-05-16: qty 28

## 2022-05-16 MED ORDER — ENOXAPARIN SODIUM 40 MG/0.4ML IJ SOSY
40.0000 mg | PREFILLED_SYRINGE | INTRAMUSCULAR | Status: DC
  Administered 2022-05-17 – 2022-05-19 (×2): 40 mg via SUBCUTANEOUS
  Filled 2022-05-16 (×2): qty 0.4

## 2022-05-16 MED ORDER — SODIUM CHLORIDE 0.9 % IV SOLN
2.0000 g | Freq: Once | INTRAVENOUS | Status: DC
  Filled 2022-05-16: qty 20

## 2022-05-16 MED ORDER — SODIUM CHLORIDE 0.9 % IV BOLUS
1000.0000 mL | Freq: Once | INTRAVENOUS | Status: AC
  Administered 2022-05-16: 1000 mL via INTRAVENOUS

## 2022-05-16 MED ORDER — CYCLOBENZAPRINE HCL 10 MG PO TABS
10.0000 mg | ORAL_TABLET | Freq: Two times a day (BID) | ORAL | Status: DC | PRN
Start: 2022-05-16 — End: 2022-05-19

## 2022-05-16 MED ORDER — ACETAMINOPHEN 325 MG PO TABS
650.0000 mg | ORAL_TABLET | Freq: Four times a day (QID) | ORAL | Status: DC | PRN
Start: 2022-05-16 — End: 2022-05-18
  Administered 2022-05-17: 650 mg via ORAL
  Filled 2022-05-16: qty 2

## 2022-05-16 MED ORDER — TRIAMCINOLONE ACETONIDE 0.1 % EX OINT
TOPICAL_OINTMENT | Freq: Two times a day (BID) | CUTANEOUS | Status: DC
  Filled 2022-05-16: qty 15

## 2022-05-16 MED ORDER — TRAZODONE HCL 50 MG PO TABS
100.0000 mg | ORAL_TABLET | Freq: Every day | ORAL | Status: DC
  Filled 2022-05-16: qty 2

## 2022-05-16 MED ORDER — HYDROXYZINE HCL 25 MG PO TABS: 25.0000 mg | ORAL_TABLET | Freq: Four times a day (QID) | ORAL | Status: DC | PRN

## 2022-05-16 MED ORDER — HYDROCODONE-ACETAMINOPHEN 5-325 MG PO TABS
1.0000 | ORAL_TABLET | Freq: Two times a day (BID) | ORAL | Status: DC | PRN
  Administered 2022-05-16 – 2022-05-18 (×4): 1 via ORAL
  Filled 2022-05-16 (×4): qty 1

## 2022-05-16 MED ORDER — HYDROXYZINE HCL 25 MG PO TABS
50.0000 mg | ORAL_TABLET | Freq: Once | ORAL | Status: AC
  Administered 2022-05-16: 50 mg via ORAL
  Filled 2022-05-16: qty 2

## 2022-05-16 MED ORDER — TRIAMCINOLONE ACETONIDE 0.5 % EX CREA: 1.0000 | TOPICAL_CREAM | Freq: Two times a day (BID) | CUTANEOUS | Status: DC | PRN

## 2022-05-16 MED ORDER — SODIUM CHLORIDE 0.9 % IV SOLN: INTRAVENOUS | Status: AC

## 2022-05-16 MED ORDER — SODIUM CHLORIDE 0.9 % IV BOLUS: 2000.0000 mL | Freq: Once | INTRAVENOUS | Status: DC

## 2022-05-16 MED ORDER — SODIUM CHLORIDE 0.9 % IV BOLUS: 1000.0000 mL | Freq: Once | INTRAVENOUS | Status: DC

## 2022-05-16 MED ORDER — DIPHENHYDRAMINE HCL 25 MG PO CAPS
50.0000 mg | ORAL_CAPSULE | Freq: Once | ORAL | Status: AC
  Administered 2022-05-16: 50 mg via ORAL
  Filled 2022-05-16: qty 2

## 2022-05-16 MED ORDER — METHYLPREDNISOLONE SODIUM SUCC 125 MG IJ SOLR
80.0000 mg | Freq: Two times a day (BID) | INTRAMUSCULAR | Status: DC
  Administered 2022-05-16 – 2022-05-18 (×3): 80 mg via INTRAVENOUS
  Filled 2022-05-16 (×3): qty 2

## 2022-05-16 MED ORDER — VANCOMYCIN HCL 1500 MG/300ML IV SOLN
1500.0000 mg | Freq: Once | INTRAVENOUS | Status: DC
  Filled 2022-05-16: qty 300

## 2022-05-16 MED ORDER — LACTATED RINGERS IV BOLUS
1000.0000 mL | Freq: Once | INTRAVENOUS | Status: AC
  Administered 2022-05-16: 1000 mL via INTRAVENOUS

## 2022-05-16 NOTE — ED Notes (Signed)
MD notified on BP. Fluids being administered when access is obtained.

## 2022-05-16 NOTE — ED Notes (Signed)
Pt meds on hold due to inability to obtain iv access. Lab and swot in room now after 2 RN attempted 2x

## 2022-05-16 NOTE — ED Triage Notes (Signed)
Pt presents with peeling skin over entire body. Pain is a 9/10. Pt reports [facility] and was give an oral medication that was helpful, but she took it all and problem came back. Pt went to dermatologist and was given cream and oral medication, but neither were helpful. Pt reports this has happened once before around 6 years prior. She moved from that home and never had another flare up until she moved back into the same home. Pt reports home has roaches and fleas. Home owner will not allow pt to clean home or spray for insects.

## 2022-05-16 NOTE — ED Notes (Signed)
Pt received a meal tray

## 2022-05-16 NOTE — Consult Note (Addendum)
Initial Consultation Note                                                                                                                                                                               Patient Demographics:    Meghan Simmons, is a 56 y.o. female  MRN: [id number]   DOB - 12-10-1965  Admit Date - 05/16/2022  Outpatient Primary MD for the patient is Soyla Dryer, PA-C   Assessment & Plan:   Assessment and Plan: 1)Extensive rash---Pt had similar skin rash in 2017/2018.... she was admitted 09/17/2016 and discharged from [facility] on 09/26/2016--- -Pleuritic erythematous rash with desquamation involves almost 90% of patients skin -she subsequently followed up with dermatologist as an outpatient in 2018-she was given a working diagnosis of  erythrodermic psoriasis   (though note bx 09/19/16 supported "dermal hypersensitivity reaction)  --She saw a physician assistant at a local dermatology office couple weeks ago --She was treated with Keflex and steroids, patient was also recently treated with antifungal agents , however a itchy rash worsened and spread all over... Over the last couple weeks her rash has gotten worse, she also complains of some arthralgia -No fever  Or chills  --WBC 18.4, procalcitonin less than 0.10 --ESR and CRP requested --I called and reviewed this case with on-call ID physician Dr. Leonor Liv Comer---who  reviewed patient's chart in epic he reviewed the photos available of patient's skin lesions--- Dr.Comer, Was concerned about possible toxic epidermal necrolysis (TEN) Vs Stevens-Johnson syndrome--- he advised against antibiotics at this time.... -ID physician recommended transfer to [facility] as they have both dermatology and the burn unit --Discussed face-to-face with on-call PCCM attending Dr. Christinia Gully---.Marland Kitchen  Please see his full consult note for his recommendations -EDP spoke to hematology at [facility] who initially recommended  cyclosporine and IV Remicade, however when EDP spoke with local hematologist Dr. Jarvis Newcomer here in [place]... He recommendations were somewhat different from recommendations from [facility] dermatology team -[facility] dermatology recommended against IV oral steroids... They recommended topical steroids however patient's rash involves close to 90% of body surface--- this required a huge dose of topical steroids with significant risk for systemic absorption --Given lack of consensus on how to treat this patient..... I recommend to EDP the patient be transferred to tertiary facility preferably [facility] where they have dermatology and the burn unit with patient to be more properly evaluated to get appropriate diagnosis prior to trying different treatment regimens that may worsen patient's condition at her --[place] dermatology recommends against IV oral steroids -ID physician Dr. Talbot Grumbling, recommends against antibiotics -Patient initially received Vanco and Zosyn-from EDP.Marland KitchenMarland Kitchen  Cultures are pending -No significant mucosal lesions found  2)HFpEF/H/o ASD--- status Post surgical repair ASD, [facility] 10/06/21. No residual shunt, HFpEF/chronic diastolic dysfunction CHF -Echo from March 2023 is reassuring with EF of 60 to 65% and no shunt noted -Clinically no evidence of CHF exacerbation if anything patient is on the dry side  3) mild acute anemia--- -Hemoglobin 11.8, it was 13.9 in June 2023... Patient also had mild anemia when she presented with similar skin lesions back in 2018 -Monitor closely -Anticipate H&H with drop with IV fluids  4)Thrombocytosis--- suspect that this is reactive... -Platelets 587 Monitor closely  5)Elevated lactic acid--lactic acid is around 2, blood pressure is soft -Anticipate some insensible fluid loss given extensive skin lesions -Patient admits to poor appetite and poor oral intake over the last several days She has  nausea but No Vomiting or Diarrhea -- Give IV fluids -No evidence of focal infection per se, chest x-ray and PCT noted -Leukocytosis may be reactive due to inflammation/skin findings -Doubt frank sepsis -Discussed with ID physician on-call  6)Depression/anxiety/insomnia--- stable, continue Celexa trazodone and hydroxyzine  7) tobacco abuse--- patient declines nicotine patch  Disposition-Hospitalist service will continue to round on patient and see patient , however patient will Not be admitted to hospitalist service at [facility] as we do not have the expertise to care for this patient here at [facility]. -EDP will continue to try to transfer patient to a tertiary center where dermatology is available and possibly burn center available as well -Recommendations were relayed to EDP Dr. Roderic Palau and Dr. Regenia Skeeter  -This case was reviewed with campus director Dr. Roger Shelter and on-call hospitalist Director Dr. Vipu-patient's chart and available photos reviewed with both directors. - Patient will stay in the ED pending transfer to tertiary center where head needs can be more appropriate limit  Dispo: The patient is from: Home              Anticipated d/c is to:  [facility] center with Dermatology and burn unit services     With History of - Reviewed by me  Past Medical History:  Diagnosis Date   Atrial septal defect    Heart murmur    Hypertension       Past Surgical History:  Procedure Laterality Date   CHOLECYSTECTOMY     TEE WITHOUT CARDIOVERSION N/A 11/27/2014   Procedure: TRANSESOPHAGEAL ECHOCARDIOGRAM (TEE);  Surgeon: [name], MD;  Location: [facility] ENDOSCOPY;  Service: Cardiovascular;  Laterality: N/A;    Chief Complaint  Patient presents with   Rash    Skin peeling off of entire body      HPI:    Meghan Simmons  is a 56 y.o. female with past medical history relevant for obesity, HTN, tobacco abuse,, status Post surgical repair ASD, [facility] 10/06/21. No residual shunt,  HFpEF/chronic diastolic dysfunction CHF presents to the ED with complaints of generalized weakness, fatigue, generalized itchy rash all over her body since early July 2023 -She saw a physician assistant at a local dermatology office couple weeks ago -She was treated with Keflex and steroids, patient was also recently treated with antifungal agents , however a itchy rash worsened and spread all over... Over the last couple weeks her rash has gotten worse, she also complains of some arthralgia Pt had similar skin rash in 2017/2018.... she was admitted 09/17/2016 and discharged from [facility] on 09/26/2016--- -she subsequently followed up with dermatologist as an outpatient in 2018-she was given a working diagnosis of  erythrodermic  psoriasis   (though note bx 09/19/16 supported "dermal hypersensitivity reaction)  - No fever  Or chills  -Patient admits to poor appetite and poor oral intake over the last several days She has nausea but No Vomiting or Diarrhea -No productive cough no chest pains no palpitations no pleuritic type symptoms no shortness of breath, no abdominal pain no dysphagia -In the ED she has borderline tachycardia and borderline soft blood pressure --- hemodynamics improved significantly with IV fluids - -Chest x-ray without acute cardiopulmonary findings -Lactic acid 2.0 in the setting of dehydration and soft blood pressures -WBC 18.4, no fevers , no chills , procalcitonin less than 0.10 -Hemoglobin 11.8, it was 13.9 in June 2023... Patient also had mild anemia when she presented with similar skin lesions back in 2018 -Platelets 587 -Sodium is 139, potassium is 4.2, BUN is 10 creatinine 0.72, calcium is 8.1, albumin is low at 2.4 so corrected calcium is actually normal, LFTs are not elevated, anion gap is 11 -PT/INR and PTT are WNL -I called and reviewed this case with on-call ID physician Dr. Leonor Liv Comer---who  reviewed patient's chart in epic he reviewed the photos available of  patient's skin lesions--- Dr.Comer, Was concerned about possible toxic epidermal necrolysis (TEN) Vs Stevens-Johnson syndrome--- he advised against antibiotics at this time.... -ID physician recommended transfer to [facility] as they have both dermatology and the burn unit -Discussed face-to-face with on-call PCCM attending Dr. Christinia Gully---.Marland Kitchen  Please see his full consult note for his recommendations -EDP spoke to dermatology at [facility] who initially recommended cyclosporine and IV Remicade, however when EDP spoke with local hematologist Dr. Jarvis Newcomer here in [place]... He recommendations were somewhat different from recommendations from [facility] dermatology team -[facility] dermatology recommended against IV oral steroids... They recommended topical steroids however patient's rash involves close to 90% of body surface--- this required a huge dose of topical steroids with significant risk for systemic absorption - -Given lack of consensus on how to treat this patient..... I recommend to EDP the patient be transferred to tertiary facility preferably [facility] where they have dermatology and the burn unit with patient to be more properly evaluated to get appropriate diagnosis prior to trying different treatment regimens that may worsen patient's condition at her - Hospitalist service will continue to round on patient and see patient , however patient will Not be admitted to hospitalist service at [facility] as we do not have the expertise to care for this patient here at [place] will continue to try to transfer patient to a tertiary center where dermatology is available and possibly burn center available as well -Recommendations were relayed to EDP Dr. Roderic Palau and Dr. Regenia Skeeter  -This case was reviewed with campus director Dr. Roger Shelter and on-call hospitalist Director Dr. Vipu-patient's chart and available photos  reviewed with both directors. - Patient will stay in the ED pending transfer to tertiary center where head needs can be more appropriate limit - -[facility] hematology recommends against IV oral steroids -ID physician Dr. Talbot Grumbling, recommends against antibiotics -Patient initially received Vanco and Zosyn-from EDP... Cultures are pending -ESR and CRP requested     Review of systems:    In addition to the HPI above,   A full Review of  Systems was done, all other systems reviewed are negative except as noted above in HPI , .    Social History:  Reviewed by me  Social History   Tobacco Use   Smoking status: Every Day    Packs/day: 0.75    Years: 15.00    Total pack years: 11.25    Types: Cigarettes    Start date: 2008   Smokeless tobacco: Never  Substance Use Topics   Alcohol use: Not Currently    Alcohol/week: 0.0 standard drinks of alcohol    Comment: hx heavy etoh.  nonce since 21    Family History :  Reviewed by me    Family History  Problem Relation Age of Onset   Diabetes Mother    Heart murmur Mother    Hypertension Mother    Depression Mother    Kidney failure Father 66       dies while at dialysis     Home Medications:   Prior to Admission medications   Medication Sig Start Date End Date Taking? Authorizing Provider  cephALEXin (KEFLEX) 500 MG capsule Take 500 mg by mouth 3 (three) times daily. 05/04/22  Yes [provider]  fluconazole (DIFLUCAN) 150 MG tablet Take 150 mg by mouth every other day. 05/04/22  Yes [provider]  hydrocortisone 2.5 % cream Apply 1 Application topically 2 (two) times daily as needed (rash). 05/04/22  Yes [provider]  nystatin cream (MYCOSTATIN) Apply 1 Application topically 2 (two) times daily as needed. 05/04/22  Yes [provider]  triamcinolone cream (KENALOG) 0.5 % Apply 1 Application topically 2 (two) times daily as needed. 05/04/22  Yes [provider]  atorvastatin (LIPITOR) 40 MG tablet Take 1 tablet (40 mg total) by mouth daily. Patient not taking: Reported on 04/17/2022 02/28/22   Soyla Dryer, PA-C  citalopram (CELEXA) 20 MG tablet Take 10 mg by mouth daily. Patient not taking: Reported on 04/17/2022    [provider]  cyclobenzaprine (FLEXERIL) 10 MG tablet Take 1 tablet (10 mg total) by mouth 2 (two) times daily as needed for muscle spasms. Patient not taking: Reported on 04/12/2022 02/28/22   Mordecai Rasmussen, MD  doxycycline (VIBRA-TABS) 100 MG tablet Take 1 tablet (100 mg total) by mouth 2 (two) times daily. Patient not taking: Reported on 05/16/2022 04/12/22   Soyla Dryer, PA-C  ezetimibe (ZETIA) 10 MG tablet Take 1 tablet (10 mg total) by mouth daily. Patient not taking: Reported on 04/17/2022 02/28/22   Soyla Dryer, PA-C  losartan (COZAAR) 100 MG tablet Take 100 mg by mouth daily. Patient not taking: Reported on 05/16/2022 04/06/22   [provider]  predniSONE (DELTASONE) 10 MG tablet Take by mouth in the morning. On days 1 through 2 take 6 tablets. Days 3 through 4 take 5 tablets. Days 5 through 6 take 4 tablets. Days 7 through 8 take 3 tablets. Days 9 through10 take 2 tablets. Days 11 through 12 take 1 tablet. Patient not taking: Reported on 05/16/2022 04/12/22   Soyla Dryer, PA-C  terbinafine (LAMISIL) 250 MG tablet Take 1 tablet (250 mg total) by mouth daily. Patient not taking: Reported on 05/16/2022 04/12/22   Soyla Dryer, PA-C  traZODone (DESYREL) 100 MG tablet Take 100 mg by mouth at bedtime. Patient not taking: Reported on 05/16/2022 05/04/22   [provider]  triamcinolone (KENALOG) 0.025 % ointment Apply 1 Application topically 2 (two) times daily as needed. Patient not taking: Reported on 04/24/2022 04/12/22   Soyla Dryer, PA-C     Allergies:     Allergies  Allergen Reactions   Erythromycin Nausea And Vomiting and Rash  Physical Exam:   Vitals  Blood pressure  107/61, pulse 85, temperature 97.8 F (36.6 C), temperature source Oral, resp. rate (!) 24, height 5' 3" (1.6 m), weight 81.2 kg, SpO2 100 %.  Physical Examination: General appearance - alert,  in no distress   Mental status - alert, oriented to person, place, and time,  Eyes - sclera anicteric Mouth--No significant mucosal lesions found Neck - supple, no JVD elevation , Chest - clear  to auscultation bilaterally, symmetrical air movement,  Heart - S1 and S2 normal, regular  Abdomen - soft, nontender, nondistended, +BS Neurological - screening mental status exam normal, neck supple without rigidity, cranial nerves II through XII intact, DTR's normal and symmetric Extremities- intact peripheral pulses  Skin -extensive skin findings as in the photos below---No significant mucosal lesions found Media Information   Document Information  Photos    05/16/2022 15:20  Attached To:  Hospital Encounter on 05/16/22   Source Information  Roxan Hockey, MD  [facility]      Media Information   Document Information  Photos    05/16/2022 15:18  Attached To:  Hospital Encounter on 05/16/22   Source Information  Roxan Hockey, MD  [facility]      Media Information   Document Information  Photos    05/16/2022 15:18  Attached To:  Hospital Encounter on 05/16/22   Source Information  Roxan Hockey, MD  [facility]     Media Information   Document Information  Photos    05/16/2022 09:41  Attached To:  Hospital Encounter on 05/16/22   Source Information  Sherwood Gambler, MD  [facility]     Media Information   Document Information  Photos    05/16/2022 15:17  Attached To:  Hospital Encounter on 05/16/22   Source Information  Roxan Hockey, MD  [facility]gency [facility]     Data Review:    CBC Recent Labs  Lab 05/16/22 1138  WBC 18.4*  HGB 11.8*  HCT 37.0  PLT 587*  MCV 93.0  MCH 29.6  MCHC 31.9  RDW  14.1  LYMPHSABS 1.5  MONOABS 0.7  EOSABS 0.1  BASOSABS 0.1   ------------------------------------------------------------------------------------------------------------------  Chemistries  Recent Labs  Lab 05/16/22 1138  NA 139  K 4.2  CL 104  CO2 24  GLUCOSE 92  BUN 10  CREATININE 0.72  CALCIUM 8.1*  AST 13*  ALT 12  ALKPHOS 70  BILITOT 0.7   ------------------------------------------------------------------------------------------------------------------ estimated creatinine clearance is 81.1 mL/min (by C-G formula based on SCr of 0.72 mg/dL). ------------------------------------------------------------------------------------------------------------------ No results for input(s): "TSH", "T4TOTAL", "T3FREE", "THYROIDAB" in the last 72 hours.  Invalid input(s): "FREET3"   Coagulation profile Recent Labs  Lab 05/16/22 1046  INR 1.1   ------------------------------------------------------------------------------------------------------------------- No results for input(s): "DDIMER" in the last 72 hours. -------------------------------------------------------------------------------------------------------------------  Cardiac Enzymes No results for input(s): "CKMB", "TROPONINI", "MYOGLOBIN" in the last 168 hours.  Invalid input(s): "CK" ------------------------------------------------------------------------------------------------------------------    Component Value Date/Time   BNP 47.0 04/22/2021 1105   Urinalysis    Component Value Date/Time   COLORURINE YELLOW 07/24/2014 2155   APPEARANCEUR CLEAR 07/24/2014 2155   LABSPEC 1.010 07/24/2014 2155   PHURINE 6.5 07/24/2014 2155   GLUCOSEU NEGATIVE 07/24/2014 2155   HGBUR NEGATIVE 07/24/2014 2155   BILIRUBINUR NEGATIVE 07/24/2014 2155   KETONESUR NEGATIVE 07/24/2014 2155   PROTEINUR NEGATIVE 07/24/2014 2155   UROBILINOGEN 0.2 07/24/2014 2155   NITRITE NEGATIVE 07/24/2014 2155   LEUKOCYTESUR TRACE (A)  07/24/2014 2155    ----------------------------------------------------------------------------------------------------------------  Imaging Results:    DG Chest Portable 1 View  Result Date: 05/16/2022 CLINICAL DATA:  Dyspnea on exertion. EXAM: PORTABLE CHEST 1 VIEW COMPARISON:  Chest two views 04/22/2021 FINDINGS: Cardiac silhouette is again mildly enlarged. Mediastinal contours are within limits. The lungs are clear. No pleural effusion or pneumothorax. New right axillary surgical clips. No acute skeletal abnormality. IMPRESSION: No acute cardiopulmonary disease process. Electronically Signed   By: Yvonne Kendall M.D.   On: 05/16/2022 09:59    Radiological Exams on Admission: DG Chest Portable 1 View  Result Date: 05/16/2022 CLINICAL DATA:  Dyspnea on exertion. EXAM: PORTABLE CHEST 1 VIEW COMPARISON:  Chest two views 04/22/2021 FINDINGS: Cardiac silhouette is again mildly enlarged. Mediastinal contours are within limits. The lungs are clear. No pleural effusion or pneumothorax. New right axillary surgical clips. No acute skeletal abnormality. IMPRESSION: No acute cardiopulmonary disease process. Electronically Signed   By: Yvonne Kendall M.D.   On: 05/16/2022 09:59    DVT Prophylaxis -SCD /Lovenox AM Labs Ordered, also please review Full Orders  Family Communication: Admission, patients condition and plan of care including tests being ordered have been discussed with the patient who indicate understanding and agree with the plan   Condition   stable  Roxan Hockey M.D on 05/16/2022 at 6:29 PM Go to [email or web address] -  for contact info  [facility] - Office  [phone]

## 2022-05-16 NOTE — ED Notes (Signed)
Swot RN obtained a 22 in the right hand. IV gage and location will not support a Y site of fluids and abx. MD was consulted and it was decided to run fluids to begin with because of the length of time it take chosen abx to run.  MD will attempt a 2nd IV site.

## 2022-05-16 NOTE — ED Notes (Signed)
Reached out to MD in regards to pt being uncomfortable and pain control.

## 2022-05-16 NOTE — Progress Notes (Signed)
Pharmacy Antibiotic Note  Meghan Simmons is a 56 y.o. female admitted on 05/16/2022 with cellulitis.  Pharmacy has been consulted for Vancomycin dosing.  Plan: Vancomycin '1500mg'$  IV loading dose then '750mg'$  IV Q 12 hrs. Goal AUC 400-550. Expected AUC: 430 SCr used: 0.8 (0.7 actual)  F/U cxs and clinical progress Monitor V/S, labs and levels as indicated  Height: '5\' 3"'$  (160 cm) Weight: 81.2 kg (179 lb) IBW/kg (Calculated) : 52.4  Temp (24hrs), Avg:97.9 F (36.6 C), Min:97.7 F (36.5 C), Max:98.2 F (36.8 C)  Recent Labs  Lab 05/16/22 1134 05/16/22 1138  WBC  --  18.4*  CREATININE  --  0.72  LATICACIDVEN 2.0*  --     Estimated Creatinine Clearance: 81.1 mL/min (by C-G formula based on SCr of 0.72 mg/dL).    Allergies  Allergen Reactions   Erythromycin Nausea And Vomiting and Rash    Antimicrobials this admission: Vancomycin 8/29 >>  Ceftriaxone  8/29 >>   Microbiology results: 8/29 BCx: pending  Thank you for allowing pharmacy to be a part of this patient's care.  Isac Sarna, BS Pharm D, BCPS Clinical Pharmacist 05/16/2022 3:04 PM

## 2022-05-16 NOTE — Consult Note (Signed)
NAME:  Meghan Simmons, MRN:  [id number], DOB:  1966-03-24, LOS: 0 ADMISSION DATE:  05/16/2022, CONSULTATION DATE:  8/29 REFERRING MD:  EDP, CHIEF COMPLAINT:  ? Sepsis    History of Present Illness:  36 yowf active smoker with prior admits to [facility] with rash last admit 08/2016 with dx erythrodermic psoriasis   (though note bx 09/19/16 supported "dermal hypersensitivity reaction) with initial concern sepsis rx with topical and IV steroids and IV vanc  and back with similar presentation 8/29 with borderline bp, tachycardic better p IV fluids and PCT has returned normal  Concern is severity of skin involvement and lack of derm availability at [facility] and rx and that this should be treated like a burn with aggressive IV fluid but no consensus on steroids/abx / remicade so rec transfer to BURN unit but so far no bed available.  Pt says has been nauseated and poor intake for several days but no vomiting or diarrhea and main concern is painful joints in hands and feet. No sob/ st/ cough /dysphagia or abd pain   Already rx by NP for derm as out pt with keflex and pred and worse since started rx     Scheduled Meds:  triamcinolone ointment   Topical BID   Continuous Infusions:  cefTRIAXone (ROCEPHIN)  IV Stopped (05/16/22 1257)   lactated ringers Stopped (05/16/22 1257)   sodium chloride     sodium chloride     sodium chloride     vancomycin Stopped (05/16/22 1720)   PRN Meds:.  Significant Hospital Events: Including procedures, antibiotic start and stop dates in addition to other pertinent events     Interim History / Subjective:  Feeling better p fluids, no sob or cough   Objective   Blood pressure 102/62, pulse 90, temperature 97.8 F (36.6 C), temperature source Oral, resp. rate (!) 21, height '5\' 3"'$  (1.6 m), weight 81.2 kg, SpO2 100 %.        Intake/Output Summary (Last 24 hours) at 05/16/2022 1713 Last data filed at 05/16/2022 1257 Gross per 24 hour  Intake 1100 ml   Output --  Net 1100 ml   Filed Weights   05/16/22 0858  Weight: 81.2 kg    Examination: Tmax:  98.2 General appearance:    does not appear toxic   At Rest 02 sats  100% on RA  No jvd Oropharynx clear,  mucosa nl/ very poor dentition Neck supple Lungs clear bilaterally RRR no s3 or or sign murmur Abd obese with nl  excursion  Extr warm with no edema or clubbing noted Neuro  Sensorium intact,  no apparent motor deficits Skin diffuse severe erthematous rash with dry scaling spares neck /head    I personally reviewed images and agree with radiology impression as follows:  CXR:   portable 8/29  No acute cardiopulmonary disease process.     Assessment & Plan:  1)  Diffuse erythematous rash with arthritic symptoms hand and feet c/w severe psoriasis ? erythrodermic psoriasis (though note bx 09/19/16 supported "dermal hypersensitivity reaction) Rec:  consider treating the same was she was treated last admit 08/2016 (when she wasn't septic either) if can't get her transferred to burn unit but keep in mind in meantime high risk vol depletion from insensible losses and monitor bp/ sats/ pulse UOP carefully   Understand reluctance of triad to admit to [facility] but PCCM service has nothing specific to offer at this point as she luckily does not appear  septic or for that matter critically ill.  >>> would not give additional cephalosporins but favor IV doxy as less likely to cause this pattern of drug reaction - she was given IV vance x 5 days with pos culture for MRSA but d/c on doxy and did fine last time this happened.   Will continue to monitor but nothing else to offer for now     2) very poor dentition risks pulmonary infection  >>> dental eval as outpt  Labs   CBC: Recent Labs  Lab 05/16/22 1138  WBC 18.4*  NEUTROABS 15.8*  HGB 11.8*  HCT 37.0  MCV 93.0  PLT 587*    Basic Metabolic Panel: Recent Labs  Lab 05/16/22 1138  NA 139  K 4.2  CL 104  CO2 24  GLUCOSE 92   BUN 10  CREATININE 0.72  CALCIUM 8.1*   GFR: Estimated Creatinine Clearance: 81.1 mL/min (by C-G formula based on SCr of 0.72 mg/dL). Recent Labs  Lab 05/16/22 1134 05/16/22 1138 05/16/22 1417 05/16/22 1418  PROCALCITON  --   --  <0.10  --   WBC  --  18.4*  --   --   LATICACIDVEN 2.0*  --   --  2.1*    Liver Function Tests: Recent Labs  Lab 05/16/22 1138  AST 13*  ALT 12  ALKPHOS 70  BILITOT 0.7  PROT 6.1*  ALBUMIN 2.4*   No results for input(s): "LIPASE", "AMYLASE" in the last 168 hours. No results for input(s): "AMMONIA" in the last 168 hours.  ABG No results found for: "PHART", "PCO2ART", "PO2ART", "HCO3", "TCO2", "ACIDBASEDEF", "O2SAT"   Coagulation Profile: Recent Labs  Lab 05/16/22 1046  INR 1.1    Cardiac Enzymes: No results for input(s): "CKTOTAL", "CKMB", "CKMBINDEX", "TROPONINI" in the last 168 hours.  HbA1C: Hgb A1c MFr Bld  Date/Time Value Ref Range Status  01/31/2021 08:39 AM 5.5 4.8 - 5.6 % Final    Comment:    (NOTE) Pre diabetes:          5.7%-6.4%  Diabetes:              >6.4%  Glycemic control for   <7.0% adults with diabetes     CBG: No results for input(s): "GLUCAP" in the last 168 hours.     Past Medical History:  She,  has a past medical history of Atrial septal defect, Heart murmur, and Hypertension.   Surgical History:   Past Surgical History:  Procedure Laterality Date   CHOLECYSTECTOMY     TEE WITHOUT CARDIOVERSION N/A 11/27/2014   Procedure: TRANSESOPHAGEAL ECHOCARDIOGRAM (TEE);  Surgeon: [name], MD;  Location: [facility] ENDOSCOPY;  Service: Cardiovascular;  Laterality: N/A;     Social History:   reports that she has been smoking cigarettes. She started smoking about 15 years ago. She has a 11.25 pack-year smoking history. She has never used smokeless tobacco. She reports that she does not currently use alcohol. She reports that she does not currently use drugs after having used the following drugs: Marijuana and  "Crack" cocaine.   Family History:  Her family history includes Depression in her mother; Diabetes in her mother; Heart murmur in her mother; Hypertension in her mother; Kidney failure (age of onset: 64) in her father.   Allergies Allergies  Allergen Reactions   Erythromycin Nausea And Vomiting and Rash     Home Medications  Prior to Admission medications   Medication Sig Start Date End Date Taking? Authorizing Provider  cephALEXin (  KEFLEX) 500 MG capsule Take 500 mg by mouth 3 (three) times daily. 05/04/22  Yes [provider]  fluconazole (DIFLUCAN) 150 MG tablet Take 150 mg by mouth every other day. 05/04/22  Yes [provider]  hydrocortisone 2.5 % cream Apply 1 Application topically 2 (two) times daily as needed (rash). 05/04/22  Yes [provider]  nystatin cream (MYCOSTATIN) Apply 1 Application topically 2 (two) times daily as needed. 05/04/22  Yes [provider]  triamcinolone cream (KENALOG) 0.5 % Apply 1 Application topically 2 (two) times daily as needed. 05/04/22  Yes [provider]  atorvastatin (LIPITOR) 40 MG tablet Take 1 tablet (40 mg total) by mouth daily. Patient not taking: Reported on 04/17/2022 02/28/22   Soyla Dryer, PA-C  citalopram (CELEXA) 20 MG tablet Take 10 mg by mouth daily. Patient not taking: Reported on 04/17/2022    [provider]  cyclobenzaprine (FLEXERIL) 10 MG tablet Take 1 tablet (10 mg total) by mouth 2 (two) times daily as needed for muscle spasms. Patient not taking: Reported on 04/12/2022 02/28/22   Mordecai Rasmussen, MD  doxycycline (VIBRA-TABS) 100 MG tablet Take 1 tablet (100 mg total) by mouth 2 (two) times daily. Patient not taking: Reported on 05/16/2022 04/12/22   Soyla Dryer, PA-C  ezetimibe (ZETIA) 10 MG tablet Take 1 tablet (10 mg total) by mouth daily. Patient not taking: Reported on 04/17/2022 02/28/22   Soyla Dryer, PA-C  losartan (COZAAR) 100 MG tablet Take 100 mg by mouth  daily. Patient not taking: Reported on 05/16/2022 04/06/22   [provider]  predniSONE (DELTASONE) 10 MG tablet Take by mouth in the morning. On days 1 through 2 take 6 tablets. Days 3 through 4 take 5 tablets. Days 5 through 6 take 4 tablets. Days 7 through 8 take 3 tablets. Days 9 through10 take 2 tablets. Days 11 through 12 take 1 tablet. Patient not taking: Reported on 05/16/2022 04/12/22   Soyla Dryer, PA-C  terbinafine (LAMISIL) 250 MG tablet Take 1 tablet (250 mg total) by mouth daily. Patient not taking: Reported on 05/16/2022 04/12/22   Soyla Dryer, PA-C  traZODone (DESYREL) 100 MG tablet Take 100 mg by mouth at bedtime. Patient not taking: Reported on 05/16/2022 05/04/22   [provider]  triamcinolone (KENALOG) 0.025 % ointment Apply 1 Application topically 2 (two) times daily as needed. Patient not taking: Reported on 04/24/2022 04/12/22   Soyla Dryer, PA-C      The patient is critically ill with multiple organ systems failure and requires high complexity decision making for assessment and support, frequent evaluation and titration of therapies, application of advanced monitoring technologies and extensive interpretation of multiple databases. Critical Care Time devoted to patient care services described in this note is 45 minutes.   Christinia Gully, MD Pulmonary and [place] [phone]   After 7:00 pm call Elink  [phone]

## 2022-05-16 NOTE — Sepsis Progress Note (Signed)
Code Sepsis protocol being monitored by eLink.  Problems with IV access causing delays. See nursing notes.

## 2022-05-16 NOTE — ED Notes (Signed)
Pt was bladder scanned 450ML

## 2022-05-16 NOTE — ED Provider Notes (Signed)
The patient has been taken care of by Dr. Regenia Skeeter.  He attempted to transfer her to [facility], [facility], and [facility].  All 3 tertiary care centers that have dermatology refused to accept the patient to the ED or to be admitted.  The patient has been hypotensive in the emergency department and the hospitalist has been consulted.  I spoke with Dr. Manuella Ghazi the director of the hospitalist and his recommendations were to continue attempting to transfer the patient and they are refusing to admit the patient to [facility].  I explained that there are no other tertiary care centers for [place] to call to try to admit her today.  She will continue to be treated in the emergency department by the emergency physicians with the hospitalist consulting  since they have refused to admit the patient. The Emergency physicians will attempt to transfer the patient again tomorrow.  If she hs improved she may be considered for discharge home   Milton Ferguson, MD 05/16/22 [phone]

## 2022-05-16 NOTE — ED Provider Notes (Signed)
[facility] EMERGENCY DEPARTMENT Provider Note   CSN: [id number] Arrival date & time: 05/16/22  0254     History  Chief Complaint  Patient presents with   Rash    Skin peeling off of entire body    Meghan Simmons is a 56 y.o. female.  HPI 56 year old female presents with a rash.  This has been ongoing since the middle of July.  Started off on her arms and has progressively worsened.  She is on multiple different treatments from PCP including steroids and antibiotics.  She is currently on Keflex and just finished Diflucan.  She is also been taking topical steroids.  Saw dermatology a couple weeks ago and was told this was from psoriasis.  Symptoms seem to be worsening.  She had this about 6 years ago and was told it was from psoriasis (diagnosis seems to be erythrodermic psoriasis).  She states that this occurred when she was in a certain living situation which she has recently moved back to and is infested with bugs and roaches.  No fevers.  She has been feeling short of breath on exertion recently.  Home Medications Prior to Admission medications   Medication Sig Start Date End Date Taking? Authorizing Provider  cephALEXin (KEFLEX) 500 MG capsule Take 500 mg by mouth 3 (three) times daily. 05/04/22  Yes [provider]  fluconazole (DIFLUCAN) 150 MG tablet Take 150 mg by mouth every other day. 05/04/22  Yes [provider]  hydrocortisone 2.5 % cream Apply 1 Application topically 2 (two) times daily as needed (rash). 05/04/22  Yes [provider]  nystatin cream (MYCOSTATIN) Apply 1 Application topically 2 (two) times daily as needed. 05/04/22  Yes [provider]  triamcinolone cream (KENALOG) 0.5 % Apply 1 Application topically 2 (two) times daily as needed. 05/04/22  Yes [provider]  atorvastatin (LIPITOR) 40 MG tablet Take 1 tablet (40 mg total) by mouth daily. Patient not taking: Reported on 04/17/2022 02/28/22   Soyla Dryer, PA-C   citalopram (CELEXA) 20 MG tablet Take 10 mg by mouth daily. Patient not taking: Reported on 04/17/2022    [provider]  cyclobenzaprine (FLEXERIL) 10 MG tablet Take 1 tablet (10 mg total) by mouth 2 (two) times daily as needed for muscle spasms. Patient not taking: Reported on 04/12/2022 02/28/22   Mordecai Rasmussen, MD  doxycycline (VIBRA-TABS) 100 MG tablet Take 1 tablet (100 mg total) by mouth 2 (two) times daily. Patient not taking: Reported on 05/16/2022 04/12/22   Soyla Dryer, PA-C  ezetimibe (ZETIA) 10 MG tablet Take 1 tablet (10 mg total) by mouth daily. Patient not taking: Reported on 04/17/2022 02/28/22   Soyla Dryer, PA-C  losartan (COZAAR) 100 MG tablet Take 100 mg by mouth daily. Patient not taking: Reported on 05/16/2022 04/06/22   [provider]  predniSONE (DELTASONE) 10 MG tablet Take by mouth in the morning. On days 1 through 2 take 6 tablets. Days 3 through 4 take 5 tablets. Days 5 through 6 take 4 tablets. Days 7 through 8 take 3 tablets. Days 9 through10 take 2 tablets. Days 11 through 12 take 1 tablet. Patient not taking: Reported on 05/16/2022 04/12/22   Soyla Dryer, PA-C  terbinafine (LAMISIL) 250 MG tablet Take 1 tablet (250 mg total) by mouth daily. Patient not taking: Reported on 05/16/2022 04/12/22   Soyla Dryer, PA-C  traZODone (DESYREL) 100 MG tablet Take 100 mg by mouth at bedtime. Patient not taking: Reported on 05/16/2022 05/04/22  [provider]  triamcinolone (KENALOG) 0.025 % ointment Apply 1 Application topically 2 (two) times daily as needed. Patient not taking: Reported on 04/24/2022 04/12/22   Soyla Dryer, PA-C      Allergies    Erythromycin    Review of Systems   Review of Systems  Constitutional:  Negative for fever.  Respiratory:  Positive for shortness of breath.   Cardiovascular:  Negative for chest pain.  Skin:  Positive for rash.    Physical Exam Updated Vital Signs BP (!) 96/59   Pulse 78   Temp  98.2 F (36.8 C)   Resp (!) 26   Ht '5\' 3"'$  (1.6 m)   Wt 81.2 kg   LMP  (LMP Unknown)   SpO2 (!) 86%   BMI 31.71 kg/m  Physical Exam Vitals and nursing note reviewed.  Constitutional:      Appearance: She is well-developed.  HENT:     Head: Normocephalic and atraumatic.     Mouth/Throat:     Comments: No intra-oral lesions Cardiovascular:     Rate and Rhythm: Normal rate and regular rhythm.     Heart sounds: Normal heart sounds.  Pulmonary:     Effort: Pulmonary effort is normal.     Breath sounds: Normal breath sounds.  Abdominal:     General: There is no distension.     Palpations: Abdomen is soft.     Tenderness: There is no abdominal tenderness.  Skin:    General: Skin is warm and dry.     Findings: Rash present.     Comments: See pictures.  Diffuse rash this seems to spare most of her face though is into her scalp.  No oral lesions.  She has diffuse erythema with some evidence of skin peeling.  No Nikolsky sign.  Neurological:     Mental Status: She is alert.          ED Results / Procedures / Treatments   Labs (all labs ordered are listed, but only abnormal results are displayed) Labs Reviewed  COMPREHENSIVE METABOLIC PANEL - Abnormal; Notable for the following components:      Result Value   Calcium 8.1 (*)    Total Protein 6.1 (*)    Albumin 2.4 (*)    AST 13 (*)    All other components within normal limits  CBC WITH DIFFERENTIAL/PLATELET - Abnormal; Notable for the following components:   WBC 18.4 (*)    Hemoglobin 11.8 (*)    Platelets 587 (*)    Neutro Abs 15.8 (*)    Abs Immature Granulocytes 0.36 (*)    All other components within normal limits  LACTIC ACID, PLASMA - Abnormal; Notable for the following components:   Lactic Acid, Venous 2.0 (*)    All other components within normal limits  LACTIC ACID, PLASMA - Abnormal; Notable for the following components:   Lactic Acid, Venous 2.1 (*)    All other components within normal limits  CULTURE,  BLOOD (ROUTINE X 2)  CULTURE, BLOOD (ROUTINE X 2)  RESP PANEL BY RT-PCR (FLU A&B, COVID) [id number]  PROTIME-INR  APTT  PROCALCITONIN    EKG EKG Interpretation  Date/Time:  Tuesday May 16 2022 10:31:01 EDT Ventricular Rate:  82 PR Interval:  129 QRS Duration: 103 QT Interval:  387 QTC Calculation: 452 R Axis:   56 Text Interpretation: Sinus rhythm Baseline wander in lead(s) V3 No old tracing to compare no significant change since Aug 2022 Confirmed by Sherwood Gambler [phone]) on  05/16/2022 10:45:38 AM  Radiology DG Chest Portable 1 View  Result Date: 05/16/2022 CLINICAL DATA:  Dyspnea on exertion. EXAM: PORTABLE CHEST 1 VIEW COMPARISON:  Chest two views 04/22/2021 FINDINGS: Cardiac silhouette is again mildly enlarged. Mediastinal contours are within limits. The lungs are clear. No pleural effusion or pneumothorax. New right axillary surgical clips. No acute skeletal abnormality. IMPRESSION: No acute cardiopulmonary disease process. Electronically Signed   By: Yvonne Kendall M.D.   On: 05/16/2022 09:59    Procedures .Critical Care  Performed by: Sherwood Gambler, MD Authorized by: Sherwood Gambler, MD   Critical care provider statement:    Critical care time (minutes):  60   Critical care time was exclusive of:  Separately billable procedures and treating other patients   Critical care was necessary to treat or prevent imminent or life-threatening deterioration of the following conditions:  Sepsis   Critical care was time spent personally by me on the following activities:  Development of treatment plan with patient or surrogate, discussions with consultants, evaluation of patient's response to treatment, examination of patient, ordering and review of laboratory studies, ordering and review of radiographic studies, ordering and performing treatments and interventions, pulse oximetry, re-evaluation of patient's condition and review of old charts     Medications Ordered in  ED Medications  lactated ringers bolus 1,000 mL (0 mLs Intravenous Stopped 05/16/22 1257)  vancomycin (VANCOREADY) IVPB 1500 mg/300 mL ( Intravenous Restarted 05/16/22 1200)  cefTRIAXone (ROCEPHIN) 2 g in sodium chloride 0.9 % 100 mL IVPB (0 g Intravenous Stopped 05/16/22 1257)  triamcinolone ointment (KENALOG) 0.1 % (has no administration in time range)  diphenhydrAMINE (BENADRYL) capsule 50 mg (50 mg Oral Given 05/16/22 1116)  lactated ringers bolus 1,000 mL (1,000 mLs Intravenous New Bag/Given 05/16/22 1257)    ED Course/ Medical Decision Making/ A&P Clinical Course as of 05/16/22 1530  Tue May 16, 2022  1250 I discussed case with Dr. Denton Brick, who asks for patient to be transferred to [facility] for dermatology to see in person. [SG]    Clinical Course User Index [SG] Sherwood Gambler, MD                           Medical Decision Making Amount and/or Complexity of Data Reviewed Labs: ordered.    Details: WBC of 18 is concerning for infection-has not been on steroids for over 2 weeks Radiology: ordered and independent interpretation performed.    Details: No pneumonia ECG/medicine tests: independent interpretation performed.    Details: No acute ischemia  Risk Prescription drug management.    3:30 PM Discussed with dermatology at [facility]. Hard to give definitive treatment due to not seeing patient. If is erythrodermic psoriasis.. Oral Steroids make disease labile, would not do that.; cyclosporine BID or infliximab; triamcinolone ointment neck down and then wrap with moist kerlex, then dry kerlex - do BID.  3:30 PM I called [facility] but they have no beds there.  3:30 PM Discussed with local dermatologist who works at the clinic patient went to a couple weeks ago, Dr. Rozann Lesches - can do cyclosporine to do reduce her acute psoriasis, could also be considered as outpatient. As long as no renal disease or HTN should be ok. Recommends triamcinolone 0.1% ointment BID  3:30 PM D/w Dr. Denton Brick. Will  consult.   3:30 PM [facility] has a 3 day wait list for admission.  I am concerned about a potential superinfection as her psoriasis has led to decreased skin barrier.  She is intermittently tachycardic and has been hypotensive, a couple in the 80s but mostly in the 90s.  She has been given 2 L IV fluids.  She was given vancomycin and Rocephin.  She is otherwise stable but I think needs admission.  Unfortunately, 3 different tertiary care centers with dermatology have no beds and cannot accept her.  Thus I think we have to admit here and then we can work on transfer.  I have ordered the triamcinolone as recommended by dermatology.  Care transferred to Dr. Roderic Palau while awaiting hospitalist consultation.         Final Clinical Impression(s) / ED Diagnoses Final diagnoses:  Erythroderma  Sepsis, due to unspecified organism, unspecified whether acute organ dysfunction present [facility])    Rx / DC Orders ED Discharge Orders     None         Sherwood Gambler, MD 05/16/22 1540

## 2022-05-16 NOTE — ED Provider Notes (Signed)
I spoke to [facility] about excepting this patient as an ED to ED transfer and having dermatology see the patient.  They stated they do not have availability to accept the patient even to the ED.  They recommend to try again tomorrow and call back and see if they have availability to take care of this patient.  She will stay in the emergency department at [facility] and continue her care   Milton Ferguson, MD 05/16/22 1810

## 2022-05-17 DIAGNOSIS — I5032 Chronic diastolic (congestive) heart failure: Secondary | ICD-10-CM

## 2022-05-17 DIAGNOSIS — I10 Essential (primary) hypertension: Secondary | ICD-10-CM

## 2022-05-17 DIAGNOSIS — E669 Obesity, unspecified: Secondary | ICD-10-CM

## 2022-05-17 LAB — COMPREHENSIVE METABOLIC PANEL
ALT: 11 U/L (ref 0–44)
AST: 15 U/L (ref 15–41)
Albumin: 2.5 g/dL — ABNORMAL LOW (ref 3.5–5.0)
Alkaline Phosphatase: 70 U/L (ref 38–126)
Anion gap: 11 (ref 5–15)
BUN: 10 mg/dL (ref 6–20)
CO2: 19 mmol/L — ABNORMAL LOW (ref 22–32)
Calcium: 8.1 mg/dL — ABNORMAL LOW (ref 8.9–10.3)
Chloride: 108 mmol/L (ref 98–111)
Creatinine, Ser: 0.65 mg/dL (ref 0.44–1.00)
GFR, Estimated: >60 mL/min (ref >60)
Glucose, Bld: 144 mg/dL — ABNORMAL HIGH (ref 70–99)
Potassium: 4.4 mmol/L (ref 3.5–5.1)
Sodium: 138 mmol/L (ref 135–145)
Total Bilirubin: 0.5 mg/dL (ref 0.3–1.2)
Total Protein: 6.3 g/dL — ABNORMAL LOW (ref 6.5–8.1)

## 2022-05-17 LAB — RESP PANEL BY RT-PCR (FLU A&B, COVID) ARPGX2
Influenza A by PCR: NEGATIVE
Influenza B by PCR: NEGATIVE
SARS Coronavirus 2 by RT PCR: NEGATIVE

## 2022-05-17 LAB — URINALYSIS, ROUTINE W REFLEX MICROSCOPIC
Bilirubin Urine: NEGATIVE
Glucose, UA: NEGATIVE mg/dL
Ketones, ur: NEGATIVE mg/dL
Nitrite: NEGATIVE
Protein, ur: NEGATIVE mg/dL
Specific Gravity, Urine: 1.013 (ref 1.005–1.030)
pH: 6 (ref 5.0–8.0)

## 2022-05-17 LAB — CBC
HCT: 37.2 % (ref 36.0–46.0)
Hemoglobin: 11.6 g/dL — ABNORMAL LOW (ref 12.0–15.0)
MCH: 29.7 pg (ref 26.0–34.0)
MCHC: 31.2 g/dL (ref 30.0–36.0)
MCV: 95.1 fL (ref 80.0–100.0)
Platelets: 569 10*3/uL — ABNORMAL HIGH (ref 150–400)
RBC: 3.91 MIL/uL (ref 3.87–5.11)
RDW: 14.3 % (ref 11.5–15.5)
WBC: 14.1 10*3/uL — ABNORMAL HIGH (ref 4.0–10.5)
nRBC: 0 % (ref 0.0–0.2)

## 2022-05-17 LAB — SEDIMENTATION RATE: Sed Rate: 45 mm/hr — ABNORMAL HIGH (ref 0–22)

## 2022-05-17 LAB — LACTIC ACID, PLASMA: Lactic Acid, Venous: 1.9 mmol/L (ref 0.5–1.9)

## 2022-05-17 LAB — C-REACTIVE PROTEIN: CRP: 10.6 mg/dL — ABNORMAL HIGH (ref <1.0)

## 2022-05-17 LAB — MRSA NEXT GEN BY PCR, NASAL: MRSA by PCR Next Gen: DETECTED — AB

## 2022-05-17 MED ORDER — TRIAMCINOLONE ACETONIDE 0.025 % EX CREA
TOPICAL_CREAM | Freq: Two times a day (BID) | CUTANEOUS | Status: DC
  Administered 2022-05-18: 1 via TOPICAL
  Filled 2022-05-17 (×2): qty 15

## 2022-05-17 NOTE — TOC Progression Note (Signed)
  Transition of Care [facility]) Screening Note   Patient Details  Name: Meghan Simmons Date of Birth: 02-16-1966   Transition of Care [facility]) CM/SW Contact:    Boneta Lucks, RN Phone Number: 05/17/2022, 1:36 PM  Still in ED for workup  Transition of Care Department [facility]) has reviewed patient and no TOC needs have been identified at this time. We will continue to monitor patient advancement through interdisciplinary progression rounds. If new patient transition needs arise, please place a TOC consult.     Expected Discharge Plan: Home/Self Care Barriers to Discharge: Continued Medical Work up  Expected Discharge Plan and Services Expected Discharge Plan: Home/Self Care

## 2022-05-17 NOTE — Progress Notes (Signed)
PROGRESS NOTE    Meghan Simmons  QAS:[id number] DOB: [birth year] DOA: 05/16/2022 PCP: Soyla Dryer, PA-C    Brief Narrative:  56 year old female with a history of hypertension, obesity, diastolic heart failure, who presents to the emergency room with progressive rash, that has been worsening over the last few weeks.  She has been treated as an outpatient by her primary care physician as well as local dermatology and has completed a course of steroids, Keflex, topical steroid cream as well as fluconazole.  Despite these interventions, her rash has continued to spread/worsen.  Her condition was reviewed with local dermatology who felt that she would be best served by transferring to a tertiary center for in person dermatology evaluation.  It was felt that her current condition and needs are beyond the scope that is offered at [facility].   Assessment & Plan:   Principal Problem:   Rash and nonspecific skin eruption--unclear diagnosis and unclear etiology Active Problems:   ASD (atrial septal defect)/repaired at [facility] 10/06/21   Chronic heart failure with preserved ejection fraction/chronic diastolic dysfunction CHF   HTN (hypertension)   Obesity (BMI 30-39.9)   Tobacco use disorder   Extensive rash. -Etiology of rash is not clear -Rash is widespread over her entire body with associated desquamation -Dr. Denton Brick had discussed this case with infectious disease on-call who at this time, did not recommend antibiotics -She was also seen by PCCM yesterday will be discharged on IV steroids -Case was discussed with dermatology at [facility], as well as local dermatologist, Dr. Rozann Lesches.  Based on information that was provided to them, they both have given recommendations on potential therapies for this patient, although there does not appear to be consensus -Initial recommendations from [facility] included cyclosporin and IV Remicade, although I do not feel comfortable administering these  medicines without the direct evaluation/input of dermatology -I reviewed available images of patient's rash with local dermatologist, Dr. Nevada Crane -He felt that patient may have erythema multiforme and may also have a component of psoriasis -Steroids in the situation may be helpful and suggested considering triamcinolone cream, but ultimately her care will need to be directed by in person dermatologist -Dr. Nevada Crane felt that patient's care is beyond the scope offered at [facility], and he agrees needs to be transferred to tertiary center for specialized care -His office is willing to follow patient up, after she has been treated for her acute episode  HFpEF -No evidence of decompensation at this time, in fact she appears to be low on volume -Continue to monitor volume status  Elevated lactic acid -Suspect related to hypovolemia, possible insensible fluid loss given extensive skin lesions -Started on IV fluids and lactate has trended down -Continue to monitor -Holding off on further antibiotics  Depression/anxiety/insomnia -Stable -Continue home meds  Anemia -No evidence of bleeding at this time -Continue to follow hemoglobin   Disposition Plan: Current recommendations from local dermatology who has reviewed patient's images are that patient will need transfer to tertiary center for in person dermatology evaluation/management until her acute episode has been treated.  It is felt that her current needs are beyond the scope of what can be offered at [facility].  Hospital service will continue to follow along and assist with other medical issues pending transfer.    Subjective: Continues to have discomfort in skin over rash which is most of her body.   Objective: Vitals:   05/17/22 0530 05/17/22 0800 05/17/22 0805 05/17/22 1445  BP: 101/63 102/74  125/77  Pulse: 68 90  67  Resp: '18 16  16  '$ Temp:   (!) 97 F (36.1 C) 98.1 F (36.7 C)  TempSrc:   Axillary Oral  SpO2: 100% 92%  100%   Weight:      Height:        Intake/Output Summary (Last 24 hours) at 05/17/2022 1849 Last data filed at 05/16/2022 1938 Gross per 24 hour  Intake 1000 ml  Output --  Net 1000 ml   Filed Weights   05/16/22 0858  Weight: 81.2 kg    Examination:  General exam: Appears calm and comfortable  Respiratory system: Clear to auscultation. Respiratory effort normal. Cardiovascular system: S1 & S2 heard, RRR. No JVD, murmurs, rubs, gallops or clicks. No pedal edema. Gastrointestinal system: Abdomen is nondistended, soft and nontender. No organomegaly or masses felt. Normal bowel sounds heard. Central nervous system: Alert and oriented. No focal neurological deficits. Extremities: Symmetric 5 x 5 power. Skin: wide spread erythema over hands/legs, trunk and back. Skin is peeling throughout Psychiatry: Judgement and insight appear normal. Mood & affect appropriate.     Data Reviewed: I have personally reviewed following labs and imaging studies  CBC: Recent Labs  Lab 05/16/22 1138 05/17/22 0439  WBC 18.4* 14.1*  NEUTROABS 15.8*  --   HGB 11.8* 11.6*  HCT 37.0 37.2  MCV 93.0 95.1  PLT 587* 601*   Basic Metabolic Panel: Recent Labs  Lab 05/16/22 1138 05/17/22 0909  NA 139 138  K 4.2 4.4  CL 104 108  CO2 24 19*  GLUCOSE 92 144*  BUN 10 10  CREATININE 0.72 0.65  CALCIUM 8.1* 8.1*   GFR: Estimated Creatinine Clearance: 81.1 mL/min (by C-G formula based on SCr of 0.65 mg/dL). Liver Function Tests: Recent Labs  Lab 05/16/22 1138 05/17/22 0909  AST 13* 15  ALT 12 11  ALKPHOS 70 70  BILITOT 0.7 0.5  PROT 6.1* 6.3*  ALBUMIN 2.4* 2.5*   No results for input(s): "LIPASE", "AMYLASE" in the last 168 hours. No results for input(s): "AMMONIA" in the last 168 hours. Coagulation Profile: Recent Labs  Lab 05/16/22 1046  INR 1.1   Cardiac Enzymes: No results for input(s): "CKTOTAL", "CKMB", "CKMBINDEX", "TROPONINI" in the last 168 hours. BNP (last 3 results) No results  for input(s): "PROBNP" in the last 8760 hours. HbA1C: No results for input(s): "HGBA1C" in the last 72 hours. CBG: No results for input(s): "GLUCAP" in the last 168 hours. Lipid Profile: No results for input(s): "CHOL", "HDL", "LDLCALC", "TRIG", "CHOLHDL", "LDLDIRECT" in the last 72 hours. Thyroid Function Tests: No results for input(s): "TSH", "T4TOTAL", "FREET4", "T3FREE", "THYROIDAB" in the last 72 hours. Anemia Panel: No results for input(s): "VITAMINB12", "FOLATE", "FERRITIN", "TIBC", "IRON", "RETICCTPCT" in the last 72 hours. Sepsis Labs: Recent Labs  Lab 05/16/22 1134 05/16/22 1417 05/16/22 1418 05/17/22 0439  PROCALCITON  --  <0.10  --   --   LATICACIDVEN 2.0*  --  2.1* 1.9    Recent Results (from the past 240 hour(s))  Culture, blood (routine x 2)     Status: None (Preliminary result)   Collection Time: 05/16/22 11:38 AM   Specimen: BLOOD RIGHT WRIST  Result Value Ref Range Status   Specimen Description BLOOD RIGHT WRIST  Final   Special Requests   Final    BOTTLES DRAWN AEROBIC AND ANAEROBIC Blood Culture adequate volume   Culture   Final    NO GROWTH < 24 HOURS Performed at [facility], [address], [place], [place] [address]    Report Status PENDING  Incomplete  Culture, blood (routine x 2)     Status: None (Preliminary result)   Collection Time: 05/16/22 11:38 AM   Specimen: BLOOD LEFT ARM  Result Value Ref Range Status   Specimen Description BLOOD LEFT ARM  Final   Special Requests   Final    BOTTLES DRAWN AEROBIC ONLY Blood Culture adequate volume   Culture   Final    NO GROWTH < 24 HOURS Performed at [facility], [address] [place], [place] [address]    Report Status PENDING  Incomplete  Resp Panel by RT-PCR (Flu A&B, Covid) Anterior Nasal Swab     Status: None   Collection Time: 05/17/22  8:00 AM   Specimen: Anterior Nasal Swab  Result Value Ref Range Status   SARS Coronavirus 2 by RT PCR NEGATIVE NEGATIVE Final    Comment:  (NOTE) SARS-CoV-2 target nucleic acids are NOT DETECTED.  The SARS-CoV-2 RNA is generally detectable in upper respiratory specimens during the acute phase of infection. The lowest concentration of SARS-CoV-2 viral copies this assay can detect is 138 copies/mL. A negative result does not preclude SARS-Cov-2 infection and should not be used as the sole basis for treatment or other patient management decisions. A negative result may occur with  improper specimen collection/handling, submission of specimen other than nasopharyngeal swab, presence of viral mutation(s) within the areas targeted by this assay, and inadequate number of viral copies(<138 copies/mL). A negative result must be combined with clinical observations, patient history, and epidemiological information. The expected result is Negative.  Fact Sheet for Patients:  EntrepreneurPulse.com.au  Fact Sheet for Healthcare Providers:  IncredibleEmployment.be  This test is no t yet approved or cleared by the [place] FDA and  has been authorized for detection and/or diagnosis of SARS-CoV-2 by FDA under an Emergency Use Authorization (EUA). This EUA will remain  in effect (meaning this test can be used) for the duration of the COVID-19 declaration under Section 564(b)(1) of the Act, 21 U.S.C.section 360bbb-3(b)(1), unless the authorization is terminated  or revoked sooner.       Influenza A by PCR NEGATIVE NEGATIVE Final   Influenza B by PCR NEGATIVE NEGATIVE Final    Comment: (NOTE) The Xpert Xpress SARS-CoV-2/FLU/RSV plus assay is intended as an aid in the diagnosis of influenza from Nasopharyngeal swab specimens and should not be used as a sole basis for treatment. Nasal washings and aspirates are unacceptable for Xpert Xpress SARS-CoV-2/FLU/RSV testing.  Fact Sheet for Patients: EntrepreneurPulse.com.au  Fact Sheet for Healthcare  Providers: IncredibleEmployment.be  This test is not yet approved or cleared by the [place] FDA and has been authorized for detection and/or diagnosis of SARS-CoV-2 by FDA under an Emergency Use Authorization (EUA). This EUA will remain in effect (meaning this test can be used) for the duration of the COVID-19 declaration under Section 564(b)(1) of the Act, 21 U.S.C. section 360bbb-3(b)(1), unless the authorization is terminated or revoked.  Performed at [facility], [address] [place], [place] [address]   MRSA Next Gen by PCR, Nasal     Status: Abnormal   Collection Time: 05/17/22  8:06 AM   Specimen: Anterior Nasal Swab  Result Value Ref Range Status   MRSA by PCR Next Gen DETECTED (A) NOT DETECTED Final    Comment: RESULT CALLED TO, READ BACK BY AND VERIFIED WITH: MONTANA GILLEY @ 2774 ON 05/17/22 C VARNER (NOTE) The GeneXpert MRSA Assay (FDA approved  for NASAL specimens only), is one component of a comprehensive MRSA colonization surveillance program. It is not intended to diagnose MRSA infection nor to guide or monitor treatment for MRSA infections. Test performance is not FDA approved in patients less than 42 years old. Performed at [facility], [address] [place], [place] [address]          Radiology Studies: DG Chest Portable 1 View  Result Date: 05/16/2022 CLINICAL DATA:  Dyspnea on exertion. EXAM: PORTABLE CHEST 1 VIEW COMPARISON:  Chest two views 04/22/2021 FINDINGS: Cardiac silhouette is again mildly enlarged. Mediastinal contours are within limits. The lungs are clear. No pleural effusion or pneumothorax. New right axillary surgical clips. No acute skeletal abnormality. IMPRESSION: No acute cardiopulmonary disease process. Electronically Signed   By: Yvonne Kendall M.D.   On: 05/16/2022 09:59        Scheduled Meds:  citalopram  10 mg Oral Daily   enoxaparin (LOVENOX) injection  40 mg Subcutaneous Q24H   methylPREDNISolone  (SOLU-MEDROL) injection  80 mg Intravenous Q12H   traZODone  100 mg Oral QHS   triamcinolone   Topical BID   Continuous Infusions:  lactated ringers Stopped (05/16/22 1257)   sodium chloride     sodium chloride     vancomycin Stopped (05/16/22 1720)     LOS: 0 days    Time spent: 41mns    JKathie Dike MD [facility]   If 7PM-7AM, please contact night-coverage [email or web address]  05/17/2022, 6:49 PM

## 2022-05-17 NOTE — ED Provider Notes (Signed)
Reevaluated patient. Appears well. VSS. Pending [facility] transfer attempt in AM.   Vitals:   05/17/22 0100 05/17/22 0200 05/17/22 0303 05/17/22 0307  BP: 102/71 91/62 (!) 115/94   Pulse: 65 65 (!) 58   Resp: '18 18 12 11  '$ Temp:      TempSrc:      SpO2: 100% 100% 98%   Weight:      Height:          Merrily Pew, MD 05/17/22 [phone]

## 2022-05-17 NOTE — ED Provider Notes (Signed)
Emergency Medicine Observation Re-evaluation Note  Meghan Simmons is a 56 y.o. female, seen on rounds today.  Pt initially presented to the ED for complaints of Rash (Skin peeling off of entire body) Currently, the patient is sitting in bed.  Physical Exam  BP 101/63   Pulse 68   Temp 97.8 F (36.6 C) (Oral)   Resp 18   Ht _0  (1.6 m)   Wt 81.2 kg   LMP  (LMP Unknown)   SpO2 100%   BMI 31.71 kg/m  Physical Exam General: Awake, alert Cardiac: Normal heart rate and rhythm Lungs: Even and unlabored respirations Skin: Diffuse desquamation HEENT: Mild cracking of lips with no intraoral findings  ED Course / MDM  EKG:EKG Interpretation  Date/Time:  Tuesday May 16 2022 10:31:01 EDT Ventricular Rate:  82 PR Interval:  129 QRS Duration: 103 QT Interval:  387 QTC Calculation: 452 R Axis:   56 Text Interpretation: Sinus rhythm Baseline wander in lead(s) V3 No old tracing to compare no significant change since Aug 2022 Confirmed by Sherwood Gambler [phone]) on 05/16/2022 10:45:38 AM  I have reviewed the labs performed to date as well as medications administered while in observation.  Recent changes in the last 24 hours include the following: This patient presented to the ED yesterday for a diffuse desquamating rash that has been present for the past 1.5 months but has recently worsened.  She has been seen by PCP as well as dermatology for this.  As an outpatient, she has been treated with Keflex, Diflucan, oral steroids, and topical steroids.  In the ED, she has been treated with IV fluids, vancomycin, ceftriaxone, Kenalog ointment, and Benadryl.  Yesterday, calls were placed to [place], and [facility].  No beds were available in tertiary centers.  Hospitalist and PCCM were consulted.  PCCM recommends IV doxycycline.  They do not feel they have much to offer for ICU admission.  Hospitalist recommends continued efforts to transfer to tertiary center.  They will continue to round on  the patient while she is here in the ED.  Patient reports improved symptoms with Kenalog ointment.  Concentration appointment was reduced due to the diffuse area of her rash.  She will continue to get this twice daily.  Patient was sent to [facility] hospitalist services stated that they would not be able to accept in transfer for admission due to inavailability of any beds.  Labwork today shows downtrending WBC and ESR.  Kidney function remains normal.  Lab work from yesterday shows neutrophilia without eosinophilia.  Attempt was made to admit here at [facility].  Hospitalist stated that this would make transferring to tertiary center more difficult.  Patient remained in the ED in stable condition.  Plan  Current plan is for transfer to tertiary center.    Godfrey Pick, MD 05/19/22 2020

## 2022-05-17 NOTE — ED Notes (Signed)
Patient using bedside commode at this time.

## 2022-05-17 NOTE — ED Notes (Signed)
New bed pad changed with new gown and blankets in place.

## 2022-05-17 NOTE — ED Notes (Signed)
Put pt on beside commode and changed bedding with new draw sheet and chuck pad

## 2022-05-18 LAB — CBC
HCT: 35.3 % — ABNORMAL LOW (ref 36.0–46.0)
Hemoglobin: 11.2 g/dL — ABNORMAL LOW (ref 12.0–15.0)
MCH: 30 pg (ref 26.0–34.0)
MCHC: 31.7 g/dL (ref 30.0–36.0)
MCV: 94.6 fL (ref 80.0–100.0)
Platelets: 596 10*3/uL — ABNORMAL HIGH (ref 150–400)
RBC: 3.73 MIL/uL — ABNORMAL LOW (ref 3.87–5.11)
RDW: 14.4 % (ref 11.5–15.5)
WBC: 20.8 10*3/uL — ABNORMAL HIGH (ref 4.0–10.5)
nRBC: 0 % (ref 0.0–0.2)

## 2022-05-18 LAB — BASIC METABOLIC PANEL
Anion gap: 4 — ABNORMAL LOW (ref 5–15)
BUN: 13 mg/dL (ref 6–20)
CO2: 26 mmol/L (ref 22–32)
Calcium: 8.3 mg/dL — ABNORMAL LOW (ref 8.9–10.3)
Chloride: 109 mmol/L (ref 98–111)
Creatinine, Ser: 0.74 mg/dL (ref 0.44–1.00)
GFR, Estimated: >60 mL/min (ref >60)
Glucose, Bld: 81 mg/dL (ref 70–99)
Potassium: 3.8 mmol/L (ref 3.5–5.1)
Sodium: 139 mmol/L (ref 135–145)

## 2022-05-18 LAB — CBG MONITORING, ED
Glucose-Capillary: 68 mg/dL — ABNORMAL LOW (ref 70–99)
Glucose-Capillary: 81 mg/dL (ref 70–99)
Glucose-Capillary: 109 mg/dL — ABNORMAL HIGH (ref 70–99)

## 2022-05-18 MED ORDER — ACETAMINOPHEN 325 MG PO TABS
650.0000 mg | ORAL_TABLET | Freq: Four times a day (QID) | ORAL | Status: DC | PRN
  Administered 2022-05-19: 650 mg via ORAL
  Filled 2022-05-18: qty 2

## 2022-05-18 NOTE — Progress Notes (Signed)
PROGRESS NOTE    Meghan Simmons  NUU:[id number] DOB: 1966/03/25 DOA: 05/16/2022 PCP: Soyla Dryer, PA-C    Brief Narrative:  56 year old female with a history of hypertension, obesity, diastolic heart failure, who presents to the emergency room with progressive rash, that has been worsening over the last few weeks.  She has been treated as an outpatient by her primary care physician as well as local dermatology and has completed a course of steroids, Keflex, topical steroid cream as well as fluconazole.  Despite these interventions, her rash has continued to spread/worsen.  Her condition was reviewed with local dermatology who felt that she would be best served by transferring to a tertiary center for in person dermatology evaluation.  It was felt that her current condition and needs are beyond the scope that is offered at [facility].   Assessment & Plan:   Principal Problem:   Rash and nonspecific skin eruption--unclear diagnosis and unclear etiology Active Problems:   ASD (atrial septal defect)/repaired at [facility] 10/06/21   Chronic heart failure with preserved ejection fraction/chronic diastolic dysfunction CHF   HTN (hypertension)   Obesity (BMI 30-39.9)   Tobacco use disorder   Extensive rash. -Etiology of rash is not clear -Rash is widespread over her entire body with associated desquamation -Dr. Denton Brick had discussed this case with infectious disease on-call who at this time, did not recommend antibiotics -She was also seen by PCCM yesterday will be discharged on IV steroids -Case was discussed with dermatology at [facility], as well as local dermatologist, Dr. Rozann Lesches.  Based on information that was provided to them, they both have given recommendations on potential therapies for this patient, although there does not appear to be consensus -Initial recommendations from [facility] included cyclosporin and IV Remicade, although I do not feel comfortable administering these  medicines without the direct evaluation/input of dermatology -I reviewed available images of patient's rash with local dermatologist, Dr. Nevada Crane -He felt that patient may have erythema multiforme and may also have a component of psoriasis -Steroids in the situation may be helpful and suggested considering triamcinolone cream, but ultimately her care will need to be directed by in-person dermatologist -Dr. Nevada Crane felt that patient's care is beyond the scope offered at [facility], and he agrees needs to be transferred to tertiary center for specialized care -His office is willing to follow patient up, after she has been treated for her acute episode  HFpEF -No evidence of decompensation at this time -Continue to monitor volume status  Elevated lactic acid -Suspect related to hypovolemia, possible insensible fluid loss given extensive skin lesions -Started on IV fluids and lactate has trended down -Continue to monitor -Holding off on further antibiotics  Depression/anxiety/insomnia -Stable -Continue home meds  Anemia -No evidence of bleeding at this time -Continue to follow hemoglobin  Hypoglycemia -noted that patient found to have cbg 68 this morning -appears to be asymptomatic -she is not on insulin or hypoglycemic agents -question accuracy of this  -will check serum glucose on chemistry panel.   Disposition Plan: Current recommendations from local dermatology who has reviewed patient's images are that patient will need transfer to tertiary center for in person dermatology evaluation/management until her acute episode has been treated.  It is felt that her current needs are beyond the scope of what can be offered at [facility].  Hospital service will continue to follow along and assist with other medical issues pending transfer.    Subjective: Complains of persistent pruritis in her legs,  continues to have discomfort from rash in her hands and back  Objective: Vitals:   05/17/22  1445 05/17/22 2105 05/18/22 0535 05/18/22 0818  BP: 125/77 128/81 129/77 124/70  Pulse: 67 71 75 60  Resp: '16 15 19 18  '$ Temp: 98.1 F (36.7 C) 98.3 F (36.8 C) 98.1 F (36.7 C)   TempSrc: Oral  Oral   SpO2: 100% 100% 98% 100%  Weight:      Height:        Intake/Output Summary (Last 24 hours) at 05/18/2022 0854 Last data filed at 05/17/2022 2117 Gross per 24 hour  Intake --  Output 1200 ml  Net -1200 ml   Filed Weights   05/16/22 0858  Weight: 81.2 kg    Examination:  General exam: Appears calm and comfortable  Respiratory system: Clear to auscultation. Respiratory effort normal. Cardiovascular system: S1 & S2 heard, RRR. No JVD, murmurs, rubs, gallops or clicks. No pedal edema. Gastrointestinal system: Abdomen is nondistended, soft and nontender. No organomegaly or masses felt. Normal bowel sounds heard. Central nervous system: Alert and oriented. No focal neurological deficits. Extremities: Symmetric 5 x 5 power. Skin: wide spread erythema over hands/legs, trunk and back. Skin is peeling throughout Psychiatry: Judgement and insight appear normal. Mood & affect appropriate.     Data Reviewed: I have personally reviewed following labs and imaging studies  CBC: Recent Labs  Lab 05/16/22 1138 05/17/22 0439  WBC 18.4* 14.1*  NEUTROABS 15.8*  --   HGB 11.8* 11.6*  HCT 37.0 37.2  MCV 93.0 95.1  PLT 587* 382*   Basic Metabolic Panel: Recent Labs  Lab 05/16/22 1138 05/17/22 0909  NA 139 138  K 4.2 4.4  CL 104 108  CO2 24 19*  GLUCOSE 92 144*  BUN 10 10  CREATININE 0.72 0.65  CALCIUM 8.1* 8.1*   GFR: Estimated Creatinine Clearance: 81.1 mL/min (by C-G formula based on SCr of 0.65 mg/dL). Liver Function Tests: Recent Labs  Lab 05/16/22 1138 05/17/22 0909  AST 13* 15  ALT 12 11  ALKPHOS 70 70  BILITOT 0.7 0.5  PROT 6.1* 6.3*  ALBUMIN 2.4* 2.5*   No results for input(s): "LIPASE", "AMYLASE" in the last 168 hours. No results for input(s): "AMMONIA"  in the last 168 hours. Coagulation Profile: Recent Labs  Lab 05/16/22 1046  INR 1.1   Cardiac Enzymes: No results for input(s): "CKTOTAL", "CKMB", "CKMBINDEX", "TROPONINI" in the last 168 hours. BNP (last 3 results) No results for input(s): "PROBNP" in the last 8760 hours. HbA1C: No results for input(s): "HGBA1C" in the last 72 hours. CBG: Recent Labs  Lab 05/18/22 0809 05/18/22 0821  GLUCAP 68* 81   Lipid Profile: No results for input(s): "CHOL", "HDL", "LDLCALC", "TRIG", "CHOLHDL", "LDLDIRECT" in the last 72 hours. Thyroid Function Tests: No results for input(s): "TSH", "T4TOTAL", "FREET4", "T3FREE", "THYROIDAB" in the last 72 hours. Anemia Panel: No results for input(s): "VITAMINB12", "FOLATE", "FERRITIN", "TIBC", "IRON", "RETICCTPCT" in the last 72 hours. Sepsis Labs: Recent Labs  Lab 05/16/22 1134 05/16/22 1417 05/16/22 1418 05/17/22 0439  PROCALCITON  --  <0.10  --   --   LATICACIDVEN 2.0*  --  2.1* 1.9    Recent Results (from the past 240 hour(s))  Culture, blood (routine x 2)     Status: None (Preliminary result)   Collection Time: 05/16/22 11:38 AM   Specimen: BLOOD RIGHT WRIST  Result Value Ref Range Status   Specimen Description BLOOD RIGHT WRIST  Final   Special Requests  Final    BOTTLES DRAWN AEROBIC AND ANAEROBIC Blood Culture adequate volume   Culture   Final    NO GROWTH 2 DAYS Performed at [facility], [address] [place], [place] [address]    Report Status PENDING  Incomplete  Culture, blood (routine x 2)     Status: None (Preliminary result)   Collection Time: 05/16/22 11:38 AM   Specimen: BLOOD LEFT ARM  Result Value Ref Range Status   Specimen Description BLOOD LEFT ARM  Final   Special Requests   Final    BOTTLES DRAWN AEROBIC ONLY Blood Culture adequate volume   Culture   Final    NO GROWTH 2 DAYS Performed at [facility], [address], [place], [place] [address]    Report Status PENDING  Incomplete  Resp Panel by RT-PCR  (Flu A&B, Covid) Anterior Nasal Swab     Status: None   Collection Time: 05/17/22  8:00 AM   Specimen: Anterior Nasal Swab  Result Value Ref Range Status   SARS Coronavirus 2 by RT PCR NEGATIVE NEGATIVE Final    Comment: (NOTE) SARS-CoV-2 target nucleic acids are NOT DETECTED.  The SARS-CoV-2 RNA is generally detectable in upper respiratory specimens during the acute phase of infection. The lowest concentration of SARS-CoV-2 viral copies this assay can detect is 138 copies/mL. A negative result does not preclude SARS-Cov-2 infection and should not be used as the sole basis for treatment or other patient management decisions. A negative result may occur with  improper specimen collection/handling, submission of specimen other than nasopharyngeal swab, presence of viral mutation(s) within the areas targeted by this assay, and inadequate number of viral copies(<138 copies/mL). A negative result must be combined with clinical observations, patient history, and epidemiological information. The expected result is Negative.  Fact Sheet for Patients:  EntrepreneurPulse.com.au  Fact Sheet for Healthcare Providers:  IncredibleEmployment.be  This test is no t yet approved or cleared by the [place] FDA and  has been authorized for detection and/or diagnosis of SARS-CoV-2 by FDA under an Emergency Use Authorization (EUA). This EUA will remain  in effect (meaning this test can be used) for the duration of the COVID-19 declaration under Section 564(b)(1) of the Act, 21 U.S.C.section 360bbb-3(b)(1), unless the authorization is terminated  or revoked sooner.       Influenza A by PCR NEGATIVE NEGATIVE Final   Influenza B by PCR NEGATIVE NEGATIVE Final    Comment: (NOTE) The Xpert Xpress SARS-CoV-2/FLU/RSV plus assay is intended as an aid in the diagnosis of influenza from Nasopharyngeal swab specimens and should not be used as a sole basis for  treatment. Nasal washings and aspirates are unacceptable for Xpert Xpress SARS-CoV-2/FLU/RSV testing.  Fact Sheet for Patients: EntrepreneurPulse.com.au  Fact Sheet for Healthcare Providers: IncredibleEmployment.be  This test is not yet approved or cleared by the [place] FDA and has been authorized for detection and/or diagnosis of SARS-CoV-2 by FDA under an Emergency Use Authorization (EUA). This EUA will remain in effect (meaning this test can be used) for the duration of the COVID-19 declaration under Section 564(b)(1) of the Act, 21 U.S.C. section 360bbb-3(b)(1), unless the authorization is terminated or revoked.  Performed at [facility], [address] [place], [place] [address]   MRSA Next Gen by PCR, Nasal     Status: Abnormal   Collection Time: 05/17/22  8:06 AM   Specimen: Anterior Nasal Swab  Result Value Ref Range Status   MRSA by PCR Next Gen DETECTED (A)  NOT DETECTED Final    Comment: RESULT CALLED TO, READ BACK BY AND VERIFIED WITH: MONTANA GILLEY @ 8177 ON 05/17/22 C VARNER (NOTE) The GeneXpert MRSA Assay (FDA approved for NASAL specimens only), is one component of a comprehensive MRSA colonization surveillance program. It is not intended to diagnose MRSA infection nor to guide or monitor treatment for MRSA infections. Test performance is not FDA approved in patients less than 64 years old. Performed at [facility], [address] [place], [place] [address]          Radiology Studies: DG Chest Portable 1 View  Result Date: 05/16/2022 CLINICAL DATA:  Dyspnea on exertion. EXAM: PORTABLE CHEST 1 VIEW COMPARISON:  Chest two views 04/22/2021 FINDINGS: Cardiac silhouette is again mildly enlarged. Mediastinal contours are within limits. The lungs are clear. No pleural effusion or pneumothorax. New right axillary surgical clips. No acute skeletal abnormality. IMPRESSION: No acute cardiopulmonary disease process.  Electronically Signed   By: Yvonne Kendall M.D.   On: 05/16/2022 09:59        Scheduled Meds:  citalopram  10 mg Oral Daily   enoxaparin (LOVENOX) injection  40 mg Subcutaneous Q24H   methylPREDNISolone (SOLU-MEDROL) injection  80 mg Intravenous Q12H   traZODone  100 mg Oral QHS   triamcinolone   Topical BID   Continuous Infusions:  lactated ringers Stopped (05/16/22 1257)   sodium chloride     sodium chloride     vancomycin Stopped (05/16/22 1720)     LOS: 0 days    Time spent: 29mns    JKathie Dike MD [facility]   If 7PM-7AM, please contact night-coverage [email or web address]  05/18/2022, 8:54 AM

## 2022-05-18 NOTE — ED Notes (Addendum)
Pt in bed, pt reports a slight decrease in pain, skin appears better where hydrocortisone was placed, less flaky

## 2022-05-18 NOTE — ED Notes (Signed)
Pt in bed, pt requests another drink and warm blankets, both are given, pt requests more cream, pharmacy texted for refill.

## 2022-05-18 NOTE — ED Provider Notes (Signed)
5:42 PM I received a call from [facility] transfer center.  He informed me that the transfer medical director has noted that they can no longer accept patient after they found out that she has no insurance benefits.  Thus they are declining the transfer.   Sherwood Gambler, MD 05/18/22 [phone]

## 2022-05-18 NOTE — ED Notes (Signed)
Hospitalist, Memon, removed oxygen from pt. Oxygen sats reading 100% on room air.

## 2022-05-18 NOTE — ED Notes (Signed)
Blood sugar read 68, gave pt 4 oz orange juice and will recheck

## 2022-05-18 NOTE — ED Provider Notes (Signed)
Emergency Medicine Observation Re-evaluation Note  Meghan Simmons is a 56 y.o. female, seen on rounds today.  Pt initially presented to the ED for complaints of Rash (Skin peeling off of entire body) Currently, the patient is resting  Physical Exam  BP 129/77 (BP Location: Left Arm)   Pulse 75   Temp 98.1 F (36.7 C) (Oral)   Resp 19   Ht '5\' 3"'$  (1.6 m)   Wt 81.2 kg   LMP  (LMP Unknown)   SpO2 98%   BMI 31.71 kg/m  Physical Exam General: NAD Cardiac: Regular HR Lungs: No respiratory distress  ED Course / MDM  EKG:EKG Interpretation  Date/Time:  Tuesday May 16 2022 10:31:01 EDT Ventricular Rate:  82 PR Interval:  129 QRS Duration: 103 QT Interval:  387 QTC Calculation: 452 R Axis:   56 Text Interpretation: Sinus rhythm Baseline wander in lead(s) V3 No old tracing to compare no significant change since Aug 2022 Confirmed by Sherwood Gambler [phone]) on 05/16/2022 10:45:38 AM  I have reviewed the labs performed to date as well as medications administered while in observation.    Plan  Current plan is for continued IV vancomycin, IV fluids as needed, IV steroids Labs reviewed showing improved trend of WBC over past 2 days, MRSA positive, Covid flu negative.  Blood cultures NGTD.  Unfortunately it appears no surrounding hospital system has capacity to accept transfer for dermatology evaluation and management, per my review of recent records. Appreciate continued hospitalist consultation for management support while patient is boarding in ED    Wyvonnia Dusky, MD 05/18/22 1018

## 2022-05-18 NOTE — ED Notes (Signed)
Pt moved to inpt bed for comfort. Able to ambulate with standby assist. Given meal tray.

## 2022-05-18 NOTE — ED Provider Notes (Signed)
Dr Gracy Racer accepting physician hospitalist at [facility].  They will accept patient for transfer, likely would happen tomorrow due to their active bed situation.   Wyvonnia Dusky, MD 05/18/22 [phone]

## 2022-05-19 MED ORDER — TRIAMCINOLONE ACETONIDE 0.1 % EX CREA: 1.0000 | TOPICAL_CREAM | Freq: Two times a day (BID) | CUTANEOUS | 3 refills | Status: AC

## 2022-05-19 MED ORDER — HYDROXYZINE HCL 25 MG PO TABS: 25.0000 mg | ORAL_TABLET | Freq: Four times a day (QID) | ORAL | 0 refills | Status: DC | PRN

## 2022-05-19 MED ORDER — PREDNISONE 20 MG PO TABS: ORAL_TABLET | ORAL | 0 refills | Status: DC

## 2022-05-19 MED ORDER — PREDNISONE 20 MG PO TABS
40.0000 mg | ORAL_TABLET | Freq: Every day | ORAL | Status: DC
  Administered 2022-05-19: 40 mg via ORAL
  Filled 2022-05-19: qty 2

## 2022-05-19 MED ORDER — TRIAMCINOLONE ACETONIDE 0.5 % EX CREA
1.0000 | TOPICAL_CREAM | Freq: Two times a day (BID) | CUTANEOUS | 0 refills | Status: DC | PRN
Start: 2022-05-19 — End: 2023-05-30

## 2022-05-19 MED ORDER — PREDNISONE 10 MG PO TABS: ORAL_TABLET | ORAL | 0 refills | Status: AC

## 2022-05-19 MED ORDER — TRIAMCINOLONE ACETONIDE 0.5 % EX CREA: 1.0000 | TOPICAL_CREAM | Freq: Two times a day (BID) | CUTANEOUS | 0 refills | Status: DC | PRN

## 2022-05-19 NOTE — ED Notes (Signed)
PT at bedside. Patient had BM on self. Assisted to bedside commode and incontinent care provided.

## 2022-05-19 NOTE — TOC Progression Note (Addendum)
Transition of Care [facility]) - Progression Note    Patient Details  Name: CAILEY TRIGUEROS MRN: [id number] Date of Birth: 1966/02/26  [facility] CM/SW Contact  Salome Arnt, [place] Phone Number: 05/19/2022, 1:19 PM  Clinical Narrative: TOC received consult for medication assistance and transportation. Pt reports she has [facility]. MD notified. However, [facility] unsure when they can have medications delivered to pt due to weekend and holiday. MD sent scripts to [facility]. Per [facility], total is $19.25 using [facility]. Pt states she has no money. Discussed with supervisor who completed MATCH voucher with override. Pt given MATCH voucher. RN updated. PT evaluated pt and recommend home health. Discussed with pt who refuses as she said her uncle wouldn't want anyone in the home. ED to arrange transport home and RN is aware pt will need to go by [place] first.       Expected Discharge Plan: Home/Self Care Barriers to Discharge: Continued Medical Work up  Expected Discharge Plan and Services Expected Discharge Plan: Home/Self Care                                               Social Determinants of Health (SDOH) Interventions    Readmission Risk Interventions     No data to display

## 2022-05-19 NOTE — Plan of Care (Signed)
  Problem: Acute Rehab PT Goals(only PT should resolve) Goal: Pt Will Go Supine/Side To Sit Outcome: Progressing Flowsheets (Taken 05/19/2022 1242) Pt will go Supine/Side to Sit:  Independently  with modified independence Goal: Patient Will Transfer Sit To/From Stand Outcome: Progressing Flowsheets (Taken 05/19/2022 1242) Patient will transfer sit to/from stand:  with modified independence  with supervision Goal: Pt Will Transfer Bed To Chair/Chair To Bed Outcome: Progressing Flowsheets (Taken 05/19/2022 1242) Pt will Transfer Bed to Chair/Chair to Bed:  with modified independence  with supervision Goal: Pt Will Ambulate Outcome: Progressing Flowsheets (Taken 05/19/2022 1242) Pt will Ambulate:  50 feet  with min guard assist  with supervision  with rolling walker   12:43 PM, 05/19/22 Lonell Grandchild, MPT Physical Therapist with [facility] [phone] office [phone] mobile phone

## 2022-05-19 NOTE — Evaluation (Signed)
Physical Therapy Evaluation Patient Details Name: Meghan Simmons MRN: [id number] DOB: Jul 31, 1966 Today's Date: 05/19/2022  History of Present Illness  56 year old female with a history of hypertension, obesity, diastolic heart failure, who presents to the emergency room with progressive rash, that has been worsening over the last few weeks.  She has been treated as an outpatient by her primary care physician as well as local dermatology and has completed a course of steroids, Keflex, topical steroid cream as well as fluconazole.  Despite these interventions, her rash has continued to spread/worsen.  Her condition was reviewed with local dermatology who felt that she would be best served by transferring to a tertiary center for in person dermatology evaluation.  It was felt that her current condition and needs are beyond the scope that is offered at [facility].   Clinical Impression  Patient demonstrates good return for bed mobility, but requires increased time, able to transfer to [facility] and chair having to use armrest for support, required RW for safety demonstrating good return for transferring and taking steps with RW.  Patient limited for ambulation mostly due to fatigue, generalized weakness and tolerated sitting up in chair after therapy - RN aware.  Patient will benefit from continued skilled physical therapy in hospital and recommended venue below to increase strength, balance, endurance for safe ADLs and gait.         Recommendations for follow up therapy are one component of a multi-disciplinary discharge planning process, led by the attending physician.  Recommendations may be updated based on patient status, additional functional criteria and insurance authorization.  Follow Up Recommendations Home health PT      Assistance Recommended at Discharge Intermittent Supervision/Assistance  Patient can return home with the following  A little help with walking and/or transfers;A little help with  bathing/dressing/bathroom;Assistance with cooking/housework;Help with stairs or ramp for entrance    Equipment Recommendations None recommended by PT  Recommendations for Other Services       Functional Status Assessment Patient has had a recent decline in their functional status and demonstrates the ability to make significant improvements in function in a reasonable and predictable amount of time.     Precautions / Restrictions Precautions Precautions: Fall Restrictions Weight Bearing Restrictions: No      Mobility  Bed Mobility Overal bed mobility: Needs Assistance Bed Mobility: Supine to Sit, Sit to Supine     Supine to sit: Supervision Sit to supine: Supervision   General bed mobility comments: increased time, labored movement    Transfers Overall transfer level: Needs assistance Equipment used: Rolling walker (2 wheels) Transfers: Sit to/from Stand, Bed to chair/wheelchair/BSC Sit to Stand: Min guard   Step pivot transfers: Min guard       General transfer comment: slow labored movement having to lean on armrest of BSC and chair without AD, safer using RW    Ambulation/Gait Ambulation/Gait assistance: Min assist, Min guard Gait Distance (Feet): 18 Feet Assistive device: Rolling walker (2 wheels) Gait Pattern/deviations: Decreased step length - right, Decreased step length - left, Decreased stride length Gait velocity: decreased     General Gait Details: slow labored cadence with short step/stride length, limited mostly due to c/o fatigue and generalized weakness  Stairs            Wheelchair Mobility    Modified Rankin (Stroke Patients Only)       Balance Overall balance assessment: Needs assistance Sitting-balance support: Feet supported, No upper extremity supported Sitting balance-Leahy Scale: Good Sitting  balance - Comments: seated at EOB   Standing balance support: During functional activity, No upper extremity supported Standing  balance-Leahy Scale: Poor Standing balance comment: fair/poor without AD, fair/good using RW                             Pertinent Vitals/Pain Pain Assessment Pain Assessment: 0-10 Pain Score: 8  Pain Location: all over body Pain Descriptors / Indicators: Sore Pain Intervention(s): Limited activity within patient's tolerance, Monitored during session, Repositioned    Home Living Family/patient expects to be discharged to:: Private residence Living Arrangements: Other relatives Available Help at Discharge: Family;Available PRN/intermittently Type of Home: Mobile home Home Access: Stairs to enter Entrance Stairs-Rails: None Entrance Stairs-Number of Steps: 3   Home Layout: One level Home Equipment: Conservation officer, nature (2 wheels)      Prior Function Prior Level of Function : Independent/Modified Independent             Mobility Comments: Hydrographic surveyor without AD ADLs Comments: Independent     Hand Dominance        Extremity/Trunk Assessment   Upper Extremity Assessment Upper Extremity Assessment: Overall WFL for tasks assessed    Lower Extremity Assessment Lower Extremity Assessment: Generalized weakness    Cervical / Trunk Assessment Cervical / Trunk Assessment: Normal  Communication   Communication: No difficulties  Cognition Arousal/Alertness: Awake/alert Behavior During Therapy: WFL for tasks assessed/performed Overall Cognitive Status: Within Functional Limits for tasks assessed                                          General Comments      Exercises     Assessment/Plan    PT Assessment Patient needs continued PT services  PT Problem List Decreased strength;Decreased activity tolerance;Decreased balance;Decreased mobility       PT Treatment Interventions Gait training;DME instruction;Stair training;Functional mobility training;Therapeutic activities;Therapeutic exercise;Balance training;Patient/family education     PT Goals (Current goals can be found in the Care Plan section)  Acute Rehab PT Goals Patient Stated Goal: return home with family to assist PT Goal Formulation: With patient Time For Goal Achievement: 05/26/22 Potential to Achieve Goals: Good    Frequency Min 2X/week     Co-evaluation               AM-PAC PT "6 Clicks" Mobility  Outcome Measure Help needed turning from your back to your side while in a flat bed without using bedrails?: None Help needed moving from lying on your back to sitting on the side of a flat bed without using bedrails?: None Help needed moving to and from a bed to a chair (including a wheelchair)?: A Little Help needed standing up from a chair using your arms (e.g., wheelchair or bedside chair)?: A Little Help needed to walk in hospital room?: A Lot Help needed climbing 3-5 steps with a railing? : A Lot 6 Click Score: 18    End of Session   Activity Tolerance: Patient tolerated treatment well;Patient limited by fatigue Patient left: in chair;with call bell/phone within reach Nurse Communication: Mobility status PT Visit Diagnosis: Unsteadiness on feet (R26.81);Other abnormalities of gait and mobility (R26.89);Muscle weakness (generalized) (M62.81)    Time: 1062-6948 PT Time Calculation (min) (ACUTE ONLY): 37 min   Charges:   PT Evaluation $PT Eval High Complexity: 1 High PT Treatments $Therapeutic Activity:  23-37 mins        12:39 PM, 05/19/22 Lonell Grandchild, MPT Physical Therapist with [facility] [phone] office [phone] mobile phone

## 2022-05-19 NOTE — ED Notes (Signed)
Pt was given breakfast tray 

## 2022-05-19 NOTE — Discharge Instructions (Addendum)
Please call the dermatology office schedule follow-up appointment with them.  If you have new or worsening symptoms you can return to the ER.   Continue the steroids beginning tomorrow for the full 20-day course.  You can also apply the cream twice a day for 10 days.

## 2022-05-19 NOTE — ED Provider Notes (Signed)
Patient was reevaluated today and appears to have improvement overall of the rash, certainly has been stable for 4 days in the ED and not worsening.  Her blood work is also been stable as well as her vital signs.  At this point I think she is reasonably safe for outpatient management with steroids.  I reviewed her medical records and she was evaluated and discharged with a similar plan of action in 2019 for the exact same presentation.  At that time she was treated with antibiotics, but today, consultation with both ID and the hospitalist, there is a lower suspicion for sepsis or skin infection, she is chronically colonized for MRSA, but no need for further antibiotics.  We will prescribe her a long course or a taper of steroids.  Also triamcinolone cream.  She will follow-up with dermatology as an outpatient.  The hospitalist is in agreement the plan.  I consulted social worker was able to provide the patient a voucher for her medications which were sent to [place].  We have also arranged for a cab to pick up the patient and get her to her medications to get her home.  The patient has limited resources in the house and poor transportation options, unfortunately.   Wyvonnia Dusky, MD 05/19/22 1426

## 2022-05-21 LAB — CULTURE, BLOOD (ROUTINE X 2)
Culture: NO GROWTH
Culture: NO GROWTH
Special Requests: ADEQUATE
Special Requests: ADEQUATE

## 2022-05-23 ENCOUNTER — Other Ambulatory Visit: Payer: Self-pay | Admitting: Physician Assistant

## 2022-05-23 ENCOUNTER — Telehealth: Payer: Self-pay

## 2022-05-23 DIAGNOSIS — Z1231 Encounter for screening mammogram for malignant neoplasm of breast: Secondary | ICD-10-CM

## 2022-05-23 NOTE — Telephone Encounter (Signed)
Telephoned patient at mobile number. Left voice message with [facility] (scholarship) contact information. 

## 2022-05-29 ENCOUNTER — Telehealth: Payer: Self-pay

## 2022-05-29 NOTE — Telephone Encounter (Signed)
Client contacted that she needs transportation for the following appts.  05/31/22 [facility] at [place]  06/08/22 [facility] [place] at [place]: at 2 PM 06/14/22 to [facility] for Mammogram at 10:15 am.  Scheduled with [employer] for all 3. Client notified that transportation is scheduled and she will receive call and text with pick up times from [place]

## 2022-05-30 ENCOUNTER — Telehealth: Payer: Self-pay

## 2022-05-30 NOTE — Telephone Encounter (Signed)
Client called and sent text message today wanting to again confirm that transportation had been arranged for her upcoming appointments. Provided her with the information for all appointments including that [facility] transport was used for all her appointments. Also resent text from 05/29/22 to confirm all had been arranged and that Betsy Pries was utilized . She stated understanding.   [place] [employer]

## 2022-05-31 ENCOUNTER — Telehealth: Payer: Self-pay

## 2022-05-31 ENCOUNTER — Ambulatory Visit: Payer: Medicaid Other | Admitting: Physician Assistant

## 2022-05-31 NOTE — Telephone Encounter (Signed)
This am client contacted this RN at 7:35 am via text message that she never heard from [place] in regards to her pick up time. I called [facility] and spoke to K[place] and he assured me that she was on the list and would be picked up at 9 am . Client notified via text.  0923-3007 am client texts and calls stating she has not been picked up for her 0930 am appt to [facility]. This RN called [facility] transportation and was told it was an error on their end that driver did not know to pick up patient and asked if she could be late. Discussed appointment policy of [facility]. [facility] and was able to speak with the provider and to let them know patient would not be there today at no fault of her own. Office will call client to reschedule. Notified client of above and that she can contact me when she has her new appointment and we would get her transportation scheduled again.   Client contacted office and she is rescheduled for 06/15/22 at 0930 at [facility]. Will contact [employer] to arrange. Will plan to update client once it is scheduled. Will discuss getting her labs done for her appointment when she is at [facility] for her Mammogram on 06/14/22(which transportation is already scheduled for with [facility])   [place] [facility]

## 2022-06-01 ENCOUNTER — Telehealth: Payer: Self-pay

## 2022-06-01 NOTE — Telephone Encounter (Signed)
Scheduled transportation with [employer] for 06/15/22 930 appointment with [facility]. Client to be picked up at [place].  Client notified via text message and she acknowledged receipt.    [place] [employer]

## 2022-06-14 ENCOUNTER — Ambulatory Visit (HOSPITAL_COMMUNITY)
Admission: RE | Admit: 2022-06-14 | Discharge: 2022-06-14 | Disposition: A | Discharge status: Home / Self Care | Payer: Self-pay | Source: Ambulatory Visit | Attending: Physician Assistant | Admitting: Physician Assistant

## 2022-06-14 ENCOUNTER — Other Ambulatory Visit (HOSPITAL_COMMUNITY)
Admission: RE | Admit: 2022-06-14 | Discharge: 2022-06-14 | Disposition: A | Discharge status: Home / Self Care | Payer: Self-pay | Source: Ambulatory Visit | Attending: Physician Assistant | Admitting: Physician Assistant

## 2022-06-14 ENCOUNTER — Other Ambulatory Visit: Payer: Self-pay | Admitting: Physician Assistant

## 2022-06-14 DIAGNOSIS — D649 Anemia, unspecified: Secondary | ICD-10-CM | POA: Insufficient documentation

## 2022-06-14 DIAGNOSIS — E785 Hyperlipidemia, unspecified: Secondary | ICD-10-CM

## 2022-06-14 DIAGNOSIS — Z1231 Encounter for screening mammogram for malignant neoplasm of breast: Secondary | ICD-10-CM | POA: Insufficient documentation

## 2022-06-14 LAB — LIPID PANEL
Cholesterol: 223 mg/dL — ABNORMAL HIGH (ref 0–200)
HDL: 53 mg/dL (ref >40)
LDL Cholesterol: 153 mg/dL — ABNORMAL HIGH (ref 0–99)
Total CHOL/HDL Ratio: 4.2 RATIO
Triglycerides: 86 mg/dL (ref <150)
VLDL: 17 mg/dL (ref 0–40)

## 2022-06-14 LAB — COMPREHENSIVE METABOLIC PANEL
ALT: 10 U/L (ref 0–44)
AST: 14 U/L — ABNORMAL LOW (ref 15–41)
Albumin: 4.1 g/dL (ref 3.5–5.0)
Alkaline Phosphatase: 76 U/L (ref 38–126)
Anion gap: 4 — ABNORMAL LOW (ref 5–15)
BUN: 8 mg/dL (ref 6–20)
CO2: 26 mmol/L (ref 22–32)
Calcium: 9.3 mg/dL (ref 8.9–10.3)
Chloride: 112 mmol/L — ABNORMAL HIGH (ref 98–111)
Creatinine, Ser: 0.7 mg/dL (ref 0.44–1.00)
GFR, Estimated: >60 mL/min (ref >60)
Glucose, Bld: 84 mg/dL (ref 70–99)
Potassium: 4.3 mmol/L (ref 3.5–5.1)
Sodium: 142 mmol/L (ref 135–145)
Total Bilirubin: 0.7 mg/dL (ref 0.3–1.2)
Total Protein: 7.1 g/dL (ref 6.5–8.1)

## 2022-06-14 LAB — CBC
HCT: 43.3 % (ref 36.0–46.0)
Hemoglobin: 13.8 g/dL (ref 12.0–15.0)
MCH: 29.7 pg (ref 26.0–34.0)
MCHC: 31.9 g/dL (ref 30.0–36.0)
MCV: 93.3 fL (ref 80.0–100.0)
Platelets: 234 10*3/uL (ref 150–400)
RBC: 4.64 MIL/uL (ref 3.87–5.11)
RDW: 14.6 % (ref 11.5–15.5)
WBC: 5.5 10*3/uL (ref 4.0–10.5)
nRBC: 0 % (ref 0.0–0.2)

## 2022-06-15 ENCOUNTER — Encounter: Payer: Self-pay | Admitting: Physician Assistant

## 2022-06-15 ENCOUNTER — Ambulatory Visit: Payer: Medicaid Other | Admitting: Physician Assistant

## 2022-06-15 VITALS — BP 110/78 | HR 71 | Temp 97.4°F | Wt 185.7 lb

## 2022-06-15 DIAGNOSIS — E785 Hyperlipidemia, unspecified: Secondary | ICD-10-CM

## 2022-06-15 NOTE — Progress Notes (Signed)
BP 110/78   Pulse 71   Temp (!) 97.4 F (36.3 C)   Wt 185 lb 11.2 oz (84.2 kg)   LMP  (LMP Unknown)   SpO2 99%   BMI 32.90 kg/m    Subjective:    Patient ID: Meghan Simmons, female    DOB: 03-Sep-1966, 56 y.o.   MRN: [id number]  HPI: Meghan Simmons is a 56 y.o. female presenting on 06/15/2022 for Hypertension and Hyperlipidemia (/)   HPI   Chief Complaint  Patient presents with   Hypertension   Hyperlipidemia         She is still seeing the dermatologist.  She says her skin is doing better.  She says the Dermatologist was concerned about zetia as a possible contributor to her skin issues.  Pt has no complaints today.    Relevant past medical, surgical, family and social history reviewed and updated as indicated. Interim medical history since our last visit reviewed. Allergies and medications reviewed and updated.  CURRENT MEDS: Hydrocortisone cream TAC cream Nystatin cream    Current Outpatient Medications:    hydrocortisone 2.5 % cream, Apply 1 Application topically daily as needed., Disp: , Rfl:    nystatin cream (MYCOSTATIN), Apply 1 Application topically at bedtime., Disp: , Rfl:    triamcinolone cream (KENALOG) 0.1 %, Apply 1 Application topically daily., Disp: , Rfl:    atorvastatin (LIPITOR) 40 MG tablet, Take 1 tablet (40 mg total) by mouth daily. (Patient not taking: Reported on 04/17/2022), Disp: 90 tablet, Rfl: 1   citalopram (CELEXA) 20 MG tablet, Take 10 mg by mouth daily. (Patient not taking: Reported on 04/17/2022), Disp: , Rfl:    cyclobenzaprine (FLEXERIL) 10 MG tablet, Take 1 tablet (10 mg total) by mouth 2 (two) times daily as needed for muscle spasms. (Patient not taking: Reported on 04/12/2022), Disp: 30 tablet, Rfl: 0   ezetimibe (ZETIA) 10 MG tablet, Take 1 tablet (10 mg total) by mouth daily. (Patient not taking: Reported on 04/17/2022), Disp: 90 tablet, Rfl: 1   hydrOXYzine (ATARAX) 25 MG tablet, Take 1 tablet (25 mg total) by mouth every 6 (six)  hours as needed for itching or anxiety. (Patient not taking: Reported on 06/15/2022), Disp: 30 tablet, Rfl: 0   predniSONE (DELTASONE) 20 MG tablet, Take '40mg'$  po daily and decrease dose by '10mg'$  every 5 days until complete (Patient not taking: Reported on 06/15/2022), Disp: 25 tablet, Rfl: 0   traZODone (DESYREL) 100 MG tablet, Take 100 mg by mouth at bedtime. (Patient not taking: Reported on 05/16/2022), Disp: , Rfl:    triamcinolone cream (KENALOG) 0.5 %, Apply 1 Application topically 2 (two) times daily as needed. (Patient not taking: Reported on 06/15/2022), Disp: 454 g, Rfl: 0   Review of Systems  Per HPI unless specifically indicated above     Objective:    BP 110/78   Pulse 71   Temp (!) 97.4 F (36.3 C)   Wt 185 lb 11.2 oz (84.2 kg)   LMP  (LMP Unknown)   SpO2 99%   BMI 32.90 kg/m   Wt Readings from Last 3 Encounters:  06/15/22 185 lb 11.2 oz (84.2 kg)  05/16/22 179 lb (81.2 kg)  03/19/22 173 lb (78.5 kg)    Physical Exam Vitals reviewed.  Constitutional:      General: She is not in acute distress.    Appearance: She is well-developed.  HENT:     Head: Normocephalic and atraumatic.  Cardiovascular:  Rate and Rhythm: Normal rate and regular rhythm.  Pulmonary:     Effort: Pulmonary effort is normal.     Breath sounds: Normal breath sounds.  Abdominal:     General: Bowel sounds are normal.     Palpations: Abdomen is soft. There is no mass.     Tenderness: There is no abdominal tenderness.  Musculoskeletal:     Cervical back: Neck supple.  Lymphadenopathy:     Cervical: No cervical adenopathy.  Skin:    General: Skin is warm and dry.  Neurological:     Mental Status: She is alert and oriented to person, place, and time.  Psychiatric:        Behavior: Behavior normal.     Results for orders placed or performed during the hospital encounter of 06/14/22  Lipid panel  Result Value Ref Range   Cholesterol 223 (H) 0 - 200 mg/dL   Triglycerides 86 <150 mg/dL    HDL 53 >40 mg/dL   Total CHOL/HDL Ratio 4.2 RATIO   VLDL 17 0 - 40 mg/dL   LDL Cholesterol 153 (H) 0 - 99 mg/dL  Comprehensive metabolic panel  Result Value Ref Range   Sodium 142 135 - 145 mmol/L   Potassium 4.3 3.5 - 5.1 mmol/L   Chloride 112 (H) 98 - 111 mmol/L   CO2 26 22 - 32 mmol/L   Glucose, Bld 84 70 - 99 mg/dL   BUN 8 6 - 20 mg/dL   Creatinine, Ser 0.70 0.44 - 1.00 mg/dL   Calcium 9.3 8.9 - 10.3 mg/dL   Total Protein 7.1 6.5 - 8.1 g/dL   Albumin 4.1 3.5 - 5.0 g/dL   AST 14 (L) 15 - 41 U/L   ALT 10 0 - 44 U/L   Alkaline Phosphatase 76 38 - 126 U/L   Total Bilirubin 0.7 0.3 - 1.2 mg/dL   GFR, Estimated >60 >60 mL/min   Anion gap 4 (L) 5 - 15  CBC  Result Value Ref Range   WBC 5.5 4.0 - 10.5 K/uL   RBC 4.64 3.87 - 5.11 MIL/uL   Hemoglobin 13.8 12.0 - 15.0 g/dL   HCT 43.3 36.0 - 46.0 %   MCV 93.3 80.0 - 100.0 fL   MCH 29.7 26.0 - 34.0 pg   MCHC 31.9 30.0 - 36.0 g/dL   RDW 14.6 11.5 - 15.5 %   Platelets 234 150 - 400 K/uL   nRBC 0.0 0.0 - 0.2 %      Assessment & Plan:    Encounter Diagnosis  Name Primary?   Hyperlipidemia, unspecified hyperlipidemia type Yes    -reviewed labs with pt -discontinue  zetia -pt to restart atorvastatin.   If she is doing well after a week, restart citalopram.   -she is to follow up with Dermatology  10/12 at 2:20pm as scheduled -pt to follow up 3 months.  She is to contact office sooner prn

## 2022-06-15 NOTE — Patient Instructions (Signed)
Restart meds  Atorvastatin (cholesterol)  If doing well for a week, can restart  Citalopram (mental health)  Wait until follow up with dermatology before you restart your aspirin  ---------------------------------------------------------  Dermatology follow up- 06/29/22 2:20pm

## 2022-07-21 ENCOUNTER — Other Ambulatory Visit: Payer: Self-pay | Admitting: Cardiovascular Disease

## 2022-08-15 NOTE — Progress Notes (Deleted)
Cardiology Office Note   Date:  08/15/2022   ID:  Meghan Simmons, DOB 07/08/1966, MRN [id number]  PCP:  Soyla Dryer, PA-C  Cardiologist:   Dorris Carnes, MD   Pt presents for f/u of ASD    History of Present Illness: Meghan Simmons is a 56 y.o. female with a history of  HTN and a large secundum ASD  Echo in 2015 LVEF normal  RV dilated    ASD medium sized with L to R flow  PAP 46   TEE also done showing large ASD with RV dilation/dysfunction.   Suspicion for small VSD I saw her last in 2016 Cardiac CT done that showed CAD:  LAD mild; LCx small  Mod plaque, RCA with possible significant stenosis   Large ASD (27 x 29 mm) with sufficient rim on all edges except anterior.   Normal PV drainage.  No VSD noted  RV and PA enlargement noted  In 2016 the  pt was set up to see [name] to review studies, discuss options.   She declned this, even after discussion of risks for not evaluating/treating  The pt came back to clinic in July 2022   Complained of more skipping   Episodes lasted about 5 min,some dizziness + coughing.    The pt was not very active  Still smoking   She saw [name] after and went on to hae ASD repair by [name] at [place] [facility], 2023    I last saw the pt in clinic in March 2022  Since seen she denies  CP   Breathing is a little short at times   Stable overall   She says she smokes 10 to 16 cigs per ay  BP at house was 160/  UP and down     Since seen she says she is having problems with pain in her legs  She says her leg is giving her a fit   Starts in low back and goes to foot    iiscomfort is constant at times   The pt says she has had back problmes since the fall for spotng eve been bothering her since her teens   [place] [address]  The pt admits to smoking 10 to 15 cigs per day   Diet   Breakfast:   Scrampbled or boiled eggs   Cereal   Lunch  Skips Spper   Hot dogs   Chicken in oven   Potato or be  I saw the pt in May 2023   No outpatient medications have been marked as  taking for the 08/18/22 encounter (Appointment) with [name], MD.     Allergies:   Erythromycin   Past Medical History:  Diagnosis Date   Atrial septal defect    Heart murmur    Hypertension     Past Surgical History:  Procedure Laterality Date   CHOLECYSTECTOMY     TEE WITHOUT CARDIOVERSION N/A 11/27/2014   Procedure: TRANSESOPHAGEAL ECHOCARDIOGRAM (TEE);  Surgeon: [name], MD;  Location: [facility] ENDOSCOPY;  Service: Cardiovascular;  Laterality: N/A;     Social History:  The patient  reports that she has been smoking cigarettes. She started smoking about 15 years ago. She has a 11.25 pack-year smoking history. She has never used smokeless tobacco. She reports that she does not currently use alcohol. She reports that she does not currently use drugs after having used the following drugs: Marijuana and "Crack" cocaine.   Family  History:  The patient's family history includes Depression in her mother; Diabetes in her mother; Heart murmur in her mother; Hypertension in her mother; Kidney failure (age of onset: 57) in her father.    ROS:  Please see the history of present illness. All other systems are reviewed and  Negative to the above problem except as noted.    PHYSICAL EXAM: VS:  There were no vitals taken for this visit.  GEN: Obese 56 yo in no acute distress  HEENT: normal  Neck: no JVD, carotid bruit Cardiac: RRR; no murmurs  NO LE  edema  Respiratory:  clear to auscultation bilaterally GI: soft, nontender, nondistended, + BS  No hepatomegaly  MS: no deformity Moving all extremities   Skin: warm and dry, no rash Neuro:  Strength and sensation are intact Psych: euthymic mood, full affect   EKG:  EKG is not ordered    Echo   12/13/21  1. Post surgical repair ASD, [facility] 10/06/21. No residual shunt. Left ventricular ejection fraction, by estimation, is 60 to 65%. The left ventricle has normal function. The left ventricle has no regional wall motion abnormalities.  There is mild left ventricular hypertrophy of the basal-septal segment. Left ventricular diastolic parameters are consistent with Grade I diastolic dysfunction (impaired relaxation). 2. Right ventricular systolic function is normal. The right ventricular size is normal. There is normal pulmonary artery systolic pressure. 3. 4. Post surgical repair ASD, [facility] 10/06/21. No residual shunt. The mitral valve is normal in structure. No evidence of mitral valve regurgitation. No evidence of mitral stenosis. 5. The aortic valve is normal in structure. Aortic valve regurgitation is not visualized. No aortic stenosis is present. 6. The inferior vena cava is normal in size with greater than 50% respiratory variability, suggesting right atrial pressure of 3 mmHg.   Lipid Panel    Component Value Date/Time   CHOL 223 (H) 06/14/2022 1114   TRIG 86 06/14/2022 1114   TRIG 76 11/28/2021 1000   HDL 53 06/14/2022 1114   HDL 46 11/28/2021 1000   CHOLHDL 4.2 06/14/2022 1114   VLDL 17 06/14/2022 1114   LDLCALC 153 (H) 06/14/2022 1114      Wt Readings from Last 3 Encounters:  06/15/22 185 lb 11.2 oz (84.2 kg)  05/16/22 179 lb (81.2 kg)  03/19/22 173 lb (78.5 kg)      ASSESSMENT AND PLAN:    ASD   Pt is s/p secundum ASD repair in Jan 2023  REcovering well      Echo in March noted above looks good    Follow    Continue ASA   CAD   CT coronary angiogram done in 2016   Score wa s143   Poor study    Possible obstruction in M RCA and prox LCx    Remains with no symptoms of angina   Control risk factor   3   HTN  BP is OK   Follow   4  HL   Reviewed meds   Take lipitor and Zetia at dinner    Follow I future  5  Ortho   Will refer to Dr Aline Brochure for R leg pain evaluation     6  Dentistry  Needs an appt         Current medicines are reviewed at length with the patient today.  The patient does not have concerns regarding medicines.  Signed, Dorris Carnes, MD  08/15/2022 6:26 PM    [place]  [address], [place], [place]  [address] Phone: [phone]; Fax: [phone]

## 2022-08-16 ENCOUNTER — Other Ambulatory Visit: Payer: Self-pay | Admitting: Physician Assistant

## 2022-08-18 ENCOUNTER — Ambulatory Visit: Payer: Medicaid Other | Admitting: Internal Medicine

## 2022-08-21 ENCOUNTER — Encounter: Payer: Self-pay | Admitting: Internal Medicine

## 2022-09-04 ENCOUNTER — Other Ambulatory Visit: Payer: Self-pay | Admitting: Physician Assistant

## 2022-09-04 DIAGNOSIS — E785 Hyperlipidemia, unspecified: Secondary | ICD-10-CM

## 2022-09-20 ENCOUNTER — Ambulatory Visit: Payer: Medicaid Other | Admitting: Physician Assistant

## 2022-10-09 ENCOUNTER — Ambulatory Visit (HOSPITAL_COMMUNITY)
Admission: RE | Admit: 2022-10-09 | Discharge: 2022-10-09 | Disposition: A | Discharge status: Home / Self Care | Payer: Medicaid Other | Source: Ambulatory Visit | Attending: Gerontology | Admitting: Gerontology

## 2022-10-09 ENCOUNTER — Other Ambulatory Visit (HOSPITAL_COMMUNITY): Payer: Self-pay | Admitting: Gerontology

## 2022-10-09 ENCOUNTER — Other Ambulatory Visit (HOSPITAL_COMMUNITY)
Admission: RE | Admit: 2022-10-09 | Discharge: 2022-10-09 | Disposition: A | Discharge status: Home / Self Care | Payer: Medicaid Other | Source: Ambulatory Visit | Attending: Internal Medicine | Admitting: Internal Medicine

## 2022-10-09 DIAGNOSIS — Z0001 Encounter for general adult medical examination with abnormal findings: Secondary | ICD-10-CM | POA: Insufficient documentation

## 2022-10-09 DIAGNOSIS — Z113 Encounter for screening for infections with a predominantly sexual mode of transmission: Secondary | ICD-10-CM | POA: Insufficient documentation

## 2022-10-09 DIAGNOSIS — R059 Cough, unspecified: Secondary | ICD-10-CM | POA: Insufficient documentation

## 2022-10-09 DIAGNOSIS — R739 Hyperglycemia, unspecified: Secondary | ICD-10-CM | POA: Insufficient documentation

## 2022-10-09 LAB — BASIC METABOLIC PANEL
Anion gap: 8 (ref 5–15)
BUN: 18 mg/dL (ref 6–20)
CO2: 26 mmol/L (ref 22–32)
Calcium: 8.8 mg/dL — ABNORMAL LOW (ref 8.9–10.3)
Chloride: 102 mmol/L (ref 98–111)
Creatinine, Ser: 0.91 mg/dL (ref 0.44–1.00)
GFR, Estimated: >60 mL/min (ref >60)
Glucose, Bld: 90 mg/dL (ref 70–99)
Potassium: 4.2 mmol/L (ref 3.5–5.1)
Sodium: 136 mmol/L (ref 135–145)

## 2022-10-09 LAB — CBC WITH DIFFERENTIAL/PLATELET
Abs Immature Granulocytes: 0.03 10*3/uL (ref 0.00–0.07)
Basophils Absolute: 0.1 10*3/uL (ref 0.0–0.1)
Basophils Relative: 1 %
Eosinophils Absolute: 0.4 10*3/uL (ref 0.0–0.5)
Eosinophils Relative: 6 %
HCT: 44.7 % (ref 36.0–46.0)
Hemoglobin: 14.8 g/dL (ref 12.0–15.0)
Immature Granulocytes: 0 %
Lymphocytes Relative: 26 %
Lymphs Abs: 2 10*3/uL (ref 0.7–4.0)
MCH: 30.1 pg (ref 26.0–34.0)
MCHC: 33.1 g/dL (ref 30.0–36.0)
MCV: 90.9 fL (ref 80.0–100.0)
Monocytes Absolute: 0.4 10*3/uL (ref 0.1–1.0)
Monocytes Relative: 6 %
Neutro Abs: 4.5 10*3/uL (ref 1.7–7.7)
Neutrophils Relative %: 61 %
Platelets: 242 10*3/uL (ref 150–400)
RBC: 4.92 MIL/uL (ref 3.87–5.11)
RDW: 13.5 % (ref 11.5–15.5)
WBC: 7.4 10*3/uL (ref 4.0–10.5)
nRBC: 0 % (ref 0.0–0.2)

## 2022-10-09 LAB — HEPATIC FUNCTION PANEL
ALT: 12 U/L (ref 0–44)
AST: 16 U/L (ref 15–41)
Albumin: 3.9 g/dL (ref 3.5–5.0)
Alkaline Phosphatase: 92 U/L (ref 38–126)
Bilirubin, Direct: 0.1 mg/dL (ref 0.0–0.2)
Indirect Bilirubin: 0.4 mg/dL (ref 0.3–0.9)
Total Bilirubin: 0.5 mg/dL (ref 0.3–1.2)
Total Protein: 7 g/dL (ref 6.5–8.1)

## 2022-10-09 LAB — LIPID PANEL
Cholesterol: 241 mg/dL — ABNORMAL HIGH (ref 0–200)
HDL: 57 mg/dL (ref >40)
LDL Cholesterol: 166 mg/dL — ABNORMAL HIGH (ref 0–99)
Total CHOL/HDL Ratio: 4.2 RATIO
Triglycerides: 88 mg/dL (ref <150)
VLDL: 18 mg/dL (ref 0–40)

## 2022-10-09 LAB — HEMOGLOBIN A1C
Hgb A1c MFr Bld: 5.1 % (ref 4.8–5.6)
Mean Plasma Glucose: 99.67 mg/dL

## 2022-10-18 ENCOUNTER — Encounter: Payer: Self-pay | Admitting: *Deleted

## 2022-11-21 ENCOUNTER — Encounter: Payer: Self-pay | Admitting: *Deleted

## 2022-11-21 ENCOUNTER — Encounter: Payer: Self-pay | Admitting: Internal Medicine

## 2022-11-21 ENCOUNTER — Ambulatory Visit: Payer: Medicaid Other | Attending: Internal Medicine | Admitting: Internal Medicine

## 2022-11-21 VITALS — BP 124/76 | HR 79 | Ht 63.0 in | Wt 169.0 lb

## 2022-11-21 DIAGNOSIS — E785 Hyperlipidemia, unspecified: Secondary | ICD-10-CM

## 2022-11-21 DIAGNOSIS — Q211 Atrial septal defect, unspecified: Secondary | ICD-10-CM | POA: Diagnosis not present

## 2022-11-21 MED ORDER — ASPIRIN 81 MG PO TBEC: 81.0000 mg | DELAYED_RELEASE_TABLET | Freq: Every day | ORAL | 6 refills | Status: AC

## 2022-11-21 NOTE — Progress Notes (Signed)
Cardiology Office Note   Date:  11/21/2022   ID:  Meghan Simmons, DOB Oct 02, 1965, MRN [id number]  PCP:  Carrolyn Meiers, MD  Cardiologist:   Dorris Carnes, MD   Pt presents for f/u of HTN      History of Present Illness: Meghan Simmons is a 57 y.o. female with a history of  HTN and a large secundum ASD  Echo in 2015 LVEF normal  RV dilated    ASD medium sized with L to R flow  PAP 46   TEE also done showing large ASD with RV dilation/dysfunction.   Suspicion for small VSD I saw her last in 2016 Cardiac CT done that showed CAD:  LAD mild; LCx small  Mod plaque, RCA with possible significant stenosis   Large ASD (27 x 29 mm) with sufficient rim on all edges except anterior.   Normal PV drainage.  No VSD noted  RV and PA enlargement noted    The pt did not come back to clinic until 2022   She ultimately was referred to  [facility] for ASD repair in Jan 2023  R/ L heart cath noted to have  unusual LM takefoo with dampening of catherter on each engagement. No significant  Possible spasm  The then went on and had  ASD closure on Feb19, 2023  ([place])   I saw the pt in clinic in May 2023     Since seen she denies CP   She said her breathing is OK   She denies palpitations  Continues to smoke about 1/2 PPD    Current Meds  Medication Sig   atorvastatin (LIPITOR) 40 MG tablet TAKE 1 Tablet BY MOUTH ONCE EVERY DAY   citalopram (CELEXA) 20 MG tablet Take 10 mg by mouth daily.   hydrocortisone 2.5 % cream Apply 1 Application topically daily as needed.   losartan (COZAAR) 100 MG tablet Take 100 mg by mouth daily.   triamcinolone cream (KENALOG) 0.1 % Apply 1 Application topically daily.   [DISCONTINUED] aspirin EC 81 MG tablet Take 81 mg by mouth daily. Swallow whole.     Allergies:   Erythromycin   Past Medical History:  Diagnosis Date   Atrial septal defect    Heart murmur    Hypertension     Past Surgical History:  Procedure Laterality Date   CHOLECYSTECTOMY     TEE WITHOUT  CARDIOVERSION N/A 11/27/2014   Procedure: TRANSESOPHAGEAL ECHOCARDIOGRAM (TEE);  Surgeon: [name], MD;  Location: [facility] END[facility]COPY;  Service: Cardiovascular;  Laterality: N/A;     Social History:  The patient  reports that she has been smoking cigarettes. She started smoking about 16 years ago. She has a 11.25 pack-year smoking history. She has never used smokeless tobacco. She reports that she does not currently use alcohol. She reports that she does not currently use drugs after having used the following drugs: Marijuana and "Crack" cocaine.   Family History:  The patient's family history includes Depression in her mother; Diabetes in her mother; Heart murmur in her mother; Hypertension in her mother; Kidney failure (age of onset: 68) in her father.    ROS:  Please see the history of present illness. All other systems are reviewed and  Negative to the above problem except as noted.    PHYSICAL EXAM: VS:  BP 124/76   Pulse 79   Ht '5\' 3"'$  (1.6 m)   Wt 169 lb (76.7 kg)   SpO2 99%  BMI 29.94 kg/m   GEN: Overweight 57 yo in no acute distress  HEENT: normal  Neck: no JVD, carotid bruit Cardiac: RRR; no murmur  No LE  edema  Respiratory:  clear to auscultation  GI: soft, nontender,  No hepatomegaly  MS: no deformity Moving all extremities   Skin: warm and dry, no rash Neuro:  Strength and sensation are intact Psych: euthymic mood, full affect   EKG:  EKG shows SR 79 bpm   Nonspecific IVCD  Echo   12/13/21  1. Post surgical repair ASD, [facility] 10/06/21. No residual shunt. Left ventricular ejection fraction, by estimation, is 60 to 65%. The left ventricle has normal function. The left ventricle has no regional wall motion abnormalities. There is mild left ventricular hypertrophy of the basal-septal segment. Left ventricular diastolic parameters are consistent with Grade I diastolic dysfunction (impaired relaxation). 2. Right ventricular systolic function is normal. The right  ventricular size is normal. There is normal pulmonary artery systolic pressure. 3. 4. Post surgical repair ASD, [facility] 10/06/21. No residual shunt. The mitral valve is normal in structure. No evidence of mitral valve regurgitation. No evidence of mitral stenosis. 5. The aortic valve is normal in structure. Aortic valve regurgitation is not visualized. No aortic stenosis is present. 6. The inferior vena cava is normal in size with greater than 50% respiratory variability, suggesting right atrial pressure of 3 mmHg.  Cardiac Cath Jan 2023 ([facility])  Impressions:  Significant shunting predominantly right to left. Unusual LM anatomy with dampening of catheter on each engagement of LM. Borderline LCx and RCA disease as described in CT report. Elected to proceed with iFR of LCx. Unable to cannulate from radial approach. From femoral approach required JL3.5 guide.   Initially the LCx iFR was 0.83 but after further advancement of wire well past lesion, multiple assessments with guide clearly disengaged from LM showed iFR of 0.97 to 1.0.    Recommendations:  Significant predominanlty left to right shunt consistent with ASD. Likely non-significant epicardial coronary disease, with ventricularization and dampening of pressure with engagement of LM which does not appear significant by iFR.  The LM has an unusual take off and the hemodynamic significance of this is unclear and may be altered by the guide and a wire being in place.  By invasive assessment this was not significant.There may also be some component of spasm as values normalized over time, or the hemodynamic consequences may be altered by the presence of the wire and or guide, straightening out the lesion.  CT FFR might useful or adenosine MRI to assess for ischemia non-invasively. Procedure Note  Povsic, Tereasa Coop, MD - 10/05/2021 Formatting of this note might be different from the original. Impressions:  Significant shunting  predominantly right to left. Unusual LM anatomy with dampening of catheter on each engagement of LM. Borderline LCx and RCA disease as described in CT report. Elected to proceed with iFR of LCx. Unable to cannulate from radial approach. From femoral approach required JL3.5 guide.   Initially the LCx iFR was 0.83 but after further advancement of wire well past lesion, multiple assessments with guide clearly disengaged from LM showed iFR of 0.97 to 1.0.    Recommendations:  Significant predominanlty left to right shunt consistent with ASD. Likely non-significant epicardial coronary disease, with ventricularization and dampening of pressure with engagement of LM which does not appear significant by iFR.  The LM has an unusual take off and the hemodynamic significance of this is unclear and may be altered  by the guide and a wire being in place.  By invasive assessment this was not significant.There may also be some component of spasm as values normalized over time, or the hemodynamic consequences may be altered by the presence of the wire and or guide, straightening out the lesion.  CT FFR might useful or adenosine MRI to assess for ischemia non-invasively. Lipid Panel    Component Value Date/Time   CHOL 241 (H) 10/09/2022 0927   TRIG 88 10/09/2022 0927   TRIG 76 11/28/2021 1000   HDL 57 10/09/2022 0927   HDL 46 11/28/2021 1000   CHOLHDL 4.2 10/09/2022 0927   VLDL 18 10/09/2022 0927   LDLCALC 166 (H) 10/09/2022 0927      Wt Readings from Last 3 Encounters:  11/21/22 169 lb (76.7 kg)  06/15/22 185 lb 11.2 oz (84.2 kg)  05/16/22 179 lb (81.2 kg)      ASSESSMENT AND PLAN:    ASD   Pt is s/p secundum ASD repair in Jan 2023 at [facility], she continues to do well  Echo in March 2023 shows normal LV and RV function    ]  2   HTN  BP is controlled   Continue losartan    3  HL Pt not on Zetia   She developed skin rash using it On lipitor   Wold continue   Check lipds later this  year Spoke to her about cutting out processed foods (hot doges,carbs, packaged goods, SSB)  4  Tob  She continues to smoke about 1/2 ppd.  Counselled on cessatoin       Plan for F/U in NOv 2024        Current medicines are reviewed at length with the patient today.  The patient does not have concerns regarding medicines.  Signed, Dorris Carnes, MD  11/21/2022 9:17 AM    [address], [place], [place]  [address] Phone: [phone]; Fax: [phone]

## 2022-11-21 NOTE — Patient Instructions (Signed)
Medication Instructions:  Your physician recommends that you continue on your current medications as directed. Please refer to the Current Medication list given to you today.  *If you need a refill on your cardiac medications before your next appointment, please call your pharmacy*   Lab Work: Your physician recommends that you return for lab work in: June ( Lipid) Fasting   If you have labs (blood work) drawn today and your tests are completely normal, you will receive your results only by: [place] (if you have [place]) OR A paper copy in the mail If you have any lab test that is abnormal or we need to change your treatment, we will call you to review the results.   Testing/Procedures: NONE    Follow-Up: At [facility], you and your health needs are our priority.  As part of our continuing mission to provide you with exceptional heart care, we have created designated Provider Care Teams.  These Care Teams include your primary Cardiologist (physician) and Advanced Practice Providers (APPs -  Physician Assistants and Nurse Practitioners) who all work together to provide you with the care you need, when you need it.  We recommend signing up for the patient portal called "MyChart".  Sign up information is provided on this After Visit Summary.  MyChart is used to connect with patients for Virtual Visits (Telemedicine).  Patients are able to view lab/test results, encounter notes, upcoming appointments, etc.  Non-urgent messages can be sent to your provider as well.   To learn more about what you can do with MyChart, go to [email or web address].    Your next appointment:    November  Provider:   Dorris Carnes, MD    Other Instructions Thank you for choosing [facility]!

## 2022-11-27 ENCOUNTER — Other Ambulatory Visit: Payer: Self-pay | Admitting: Physician Assistant

## 2022-12-04 ENCOUNTER — Telehealth: Payer: Self-pay | Admitting: Internal Medicine

## 2022-12-04 NOTE — Telephone Encounter (Signed)
Spoke with Pt who states that she has an abscess tooth since Saturday. Pt states that she was told by Dr. Harrington Challenger that if she needed Amoxicillin to just call the office. Pt states that Dr. Legrand Rams does not know about [facility]. Please advise.

## 2022-12-04 NOTE — Telephone Encounter (Signed)
Pt c/o medication issue:  1. Name of Medication: amoxicillin (AMOXIL) 500 MG capsule (Expired)   2. How are you currently taking this medication (dosage and times per day)?    3. Are you having a reaction (difficulty breathing--STAT)? no  4. What is your medication issue? Calling to get a refill but not listed under current medication. Please advise

## 2022-12-04 NOTE — Telephone Encounter (Signed)
Pt informed of Dr. Elmarie Shiley response. Pt voiced understanding.

## 2022-12-19 ENCOUNTER — Telehealth: Payer: Self-pay | Admitting: Internal Medicine

## 2022-12-19 NOTE — Telephone Encounter (Signed)
Attempted to call the pt back but unable to leave a voice message.

## 2022-12-19 NOTE — Telephone Encounter (Signed)
Patient states that she need the dr to give her a call. Please advise

## 2022-12-26 NOTE — Telephone Encounter (Signed)
Attempted to call the pt back again but still unable to LM. Will close this encounter.

## 2023-01-10 ENCOUNTER — Telehealth: Payer: Self-pay | Admitting: *Deleted

## 2023-01-10 ENCOUNTER — Telehealth: Payer: Self-pay | Admitting: Internal Medicine

## 2023-01-10 NOTE — Telephone Encounter (Signed)
   Name: Meghan Simmons  DOB: 1966-06-13  MRN: [id number]  Primary Cardiologist: Dietrich Pates, MD   Preoperative team, please contact this patient and set up a phone call appointment for further preoperative risk assessment. Please obtain consent and complete medication review. Thank you for your help.  I confirm that guidance regarding antiplatelet and oral anticoagulation therapy has been completed and, if necessary, noted below.  Regarding ASA therapy, we recommend continuation of ASA throughout the perioperative period.  However, if the surgeon feels that cessation of ASA is required in the perioperative period, it may be stopped 5-7 days prior to surgery with a plan to resume it as soon as felt to be feasible from a surgical standpoint in the post-operative period.   SBE prophylaxis is not required for this pt.    Joylene Grapes, NP 01/10/2023, 3:32 PM  [facility]

## 2023-01-10 NOTE — Telephone Encounter (Signed)
Per the pt and she said Dr. Ross told her she should ABX. Pt has been scheduled for tele pre op appt 01/29/23 @ 10 am. Med rec and consent are done.     Patient Consent for Virtual Visit        Meghan Simmons has provided verbal consent on 01/10/2023 for a virtual visit (video or telephone).   CONSENT FOR VIRTUAL VISIT FOR:  Meghan Simmons  By participating in this virtual visit I agree to the following:  I hereby voluntarily request, consent and authorize [facility] and its employed or contracted physicians, physician assistants, nurse practitioners or other licensed health care professionals (the Practitioner), to provide me with telemedicine health care services (the "Services") as deemed necessary by the treating Practitioner. I acknowledge and consent to receive the Services by the Practitioner via telemedicine. I understand that the telemedicine visit will involve communicating with the Practitioner through live audiovisual communication technology and the disclosure of certain medical information by electronic transmission. I acknowledge that I have been given the opportunity to request an in-person assessment or other available alternative prior to the telemedicine visit and am voluntarily participating in the telemedicine visit.  I understand that I have the right to withhold or withdraw my consent to the use of telemedicine in the course of my care at any time, without affecting my right to future care or treatment, and that the Practitioner or I may terminate the telemedicine visit at any time. I understand that I have the right to inspect all information obtained and/or recorded in the course of the telemedicine visit and may receive copies of available information for a reasonable fee.  I understand that some of the potential risks of receiving the Services via telemedicine include:  Delay or interruption in medical evaluation due to technological equipment failure or  disruption; Information transmitted may not be sufficient (e.g. poor resolution of images) to allow for appropriate medical decision making by the Practitioner; and/or  In rare instances, security protocols could fail, causing a breach of personal health information.  Furthermore, I acknowledge that it is my responsibility to provide information about my medical history, conditions and care that is complete and accurate to the best of my ability. I acknowledge that Practitioner's advice, recommendations, and/or decision may be based on factors not within their control, such as incomplete or inaccurate data provided by me or distortions of diagnostic images or specimens that may result from electronic transmissions. I understand that the practice of medicine is not an exact science and that Practitioner makes no warranties or guarantees regarding treatment outcomes. I acknowledge that a copy of this consent can be made available to me via my patient portal ([facility] MyChart), or I can request a printed copy by calling the office of [facility].    I understand that my insurance will be billed for this visit.   I have read or had this consent read to me. I understand the contents of this consent, which adequately explains the benefits and risks of the Services being provided via telemedicine.  I have been provided ample opportunity to ask questions regarding this consent and the Services and have had my questions answered to my satisfaction. I give my informed consent for the services to be provided through the use of telemedicine in my medical care    

## 2023-01-10 NOTE — Telephone Encounter (Signed)
   Pre-operative Risk Assessment    Patient Name: Meghan Simmons  DOB: [birth year] MRN: [id number]     Request for Surgical Clearance    Procedure:  Dental Extraction - Amount of Teeth to be Pulled:  12 teeth   Date of Surgery:  Clearance 02/13/23                                 Surgeon:  Dr. Grafton Folk  Surgeon's Group or Practice Name:  [facility]  Phone number:  [phone] Fax number:  [phone]   Type of Clearance Requested:   - Medical  - Pharmacy:  Hold    Needs to know if anything is to be held.    Type of Anesthesia:  Local    Additional requests/questions:  Does this patient need antibiotics?  Signed, April Henson   01/10/2023, 3:03 PM

## 2023-01-10 NOTE — Telephone Encounter (Signed)
Per the pt and she said Dr. Tenny Craw told her she should ABX. Pt has been scheduled for tele pre op appt 01/29/23 @ 10 am. Med rec and consent are done.     Patient Consent for Virtual Visit        Meghan Simmons has provided verbal consent on 01/10/2023 for a virtual visit (video or telephone).   CONSENT FOR VIRTUAL VISIT FOR:  Meghan Simmons  By participating in this virtual visit I agree to the following:  I hereby voluntarily request, consent and authorize [facility] and its employed or contracted physicians, physician assistants, nurse practitioners or other licensed health care professionals (the Practitioner), to provide me with telemedicine health care services (the "Services") as deemed necessary by the treating Practitioner. I acknowledge and consent to receive the Services by the Practitioner via telemedicine. I understand that the telemedicine visit will involve communicating with the Practitioner through live audiovisual communication technology and the disclosure of certain medical information by electronic transmission. I acknowledge that I have been given the opportunity to request an in-person assessment or other available alternative prior to the telemedicine visit and am voluntarily participating in the telemedicine visit.  I understand that I have the right to withhold or withdraw my consent to the use of telemedicine in the course of my care at any time, without affecting my right to future care or treatment, and that the Practitioner or I may terminate the telemedicine visit at any time. I understand that I have the right to inspect all information obtained and/or recorded in the course of the telemedicine visit and may receive copies of available information for a reasonable fee.  I understand that some of the potential risks of receiving the Services via telemedicine include:  Delay or interruption in medical evaluation due to technological equipment failure or  disruption; Information transmitted may not be sufficient (e.g. poor resolution of images) to allow for appropriate medical decision making by the Practitioner; and/or  In rare instances, security protocols could fail, causing a breach of personal health information.  Furthermore, I acknowledge that it is my responsibility to provide information about my medical history, conditions and care that is complete and accurate to the best of my ability. I acknowledge that Practitioner's advice, recommendations, and/or decision may be based on factors not within their control, such as incomplete or inaccurate data provided by me or distortions of diagnostic images or specimens that may result from electronic transmissions. I understand that the practice of medicine is not an exact science and that Practitioner makes no warranties or guarantees regarding treatment outcomes. I acknowledge that a copy of this consent can be made available to me via my patient portal [facility] MyChart), or I can request a printed copy by calling the office of  [facility].    I understand that my insurance will be billed for this visit.   I have read or had this consent read to me. I understand the contents of this consent, which adequately explains the benefits and risks of the Services being provided via telemedicine.  I have been provided ample opportunity to ask questions regarding this consent and the Services and have had my questions answered to my satisfaction. I give my informed consent for the services to be provided through the use of telemedicine in my medical care

## 2023-01-29 ENCOUNTER — Ambulatory Visit: Payer: Medicaid Other | Attending: Internal Medicine

## 2023-01-29 DIAGNOSIS — Z01810 Encounter for preprocedural cardiovascular examination: Secondary | ICD-10-CM | POA: Diagnosis not present

## 2023-01-29 MED ORDER — AMOXICILLIN 500 MG PO TABS: 2000.0000 mg | ORAL_TABLET | Freq: Once | ORAL | 0 refills | Status: AC

## 2023-01-29 NOTE — Progress Notes (Signed)
Virtual Visit via Telephone Note   Because of Meghan Simmons's co-morbid illnesses, she is at least at moderate risk for complications without adequate follow up.  This format is felt to be most appropriate for this patient at this time.  The patient did not have access to video technology/had technical difficulties with video requiring transitioning to audio format only (telephone).  All issues noted in this document were discussed and addressed.  No physical exam could be performed with this format.  Please refer to the patient's chart for her consent to telehealth for [facility].  Evaluation Performed:  Preoperative cardiovascular risk assessment _____________   Date:  01/29/2023   Patient ID:  Meghan Simmons, DOB [birth year], MRN [id number] Patient Location:  Home Provider location:   Office  Primary Care Provider:  Benetta Spar, MD Primary Cardiologist:  Dietrich Pates, MD  Chief Complaint / Patient Profile   57 y.o. y/o female with a h/o hypertension and large secundum ASD with echo in 2015 with LVEF normal and RV dilated, TEE also done showing large ASD with RV dilation/dysfunction.  Suspicion for small VSD who is pending dental extractions and presents today for telephonic preoperative cardiovascular risk assessment.  History of Present Illness    Meghan Simmons is a 57 y.o. female who presents via audio/video conferencing for a telehealth visit today.  Pt was last seen in cardiology clinic on 11/21/2022 by Dr. Tenny Craw.  At that time [name] was doing well.  The patient is now pending procedure as outlined above. Since her last visit, she tells me that back when she saw Dr. Tenny Craw in March she was having a sharp pain in her shoulders that was intermittent and it made her arm sore.  She did not feel like it was exertional.  It only last for a few days and then went away.  It happened somewhere between November and January.  She is still smoking but slowly trying to wean  off of cigarettes.  She did finally find a dentist and found out that she needs to have several teeth extracted.  She does meet 4 METS even though her legs hold her back from a lot of activities.    Per the pt and she said Dr. Tenny Craw told her she should have ABX.     Regarding ASA therapy, we recommend continuation of ASA throughout the perioperative period. However, if the surgeon feels that cessation of ASA is required in the perioperative period, it may be stopped 5-7 days prior to surgery with a plan to resume it as soon as felt to be feasible from a surgical standpoint in the post-operative period   Amox-clav 875-125mg -made her feel really sick but she tells me that plain old Amoxicillin she did fine with that.     Past Medical History    Past Medical History:  Diagnosis Date   Atrial septal defect    Heart murmur    Hypertension    Past Surgical History:  Procedure Laterality Date   CHOLECYSTECTOMY     TEE WITHOUT CARDIOVERSION N/A 11/27/2014   Procedure: TRANSESOPHAGEAL ECHOCARDIOGRAM (TEE);  Surgeon: Pricilla Riffle, MD;  Location: [facility] ENDOSCOPY;  Service: Cardiovascular;  Laterality: N/A;    Allergies  Allergies  Allergen Reactions   Erythromycin Nausea And Vomiting and Rash    Home Medications    Prior to Admission medications   Medication Sig Start Date End Date Taking? Authorizing Provider  aspirin EC 81 MG tablet  Take 1 tablet (81 mg total) by mouth daily. Swallow whole. 11/21/22   Pricilla Riffle, MD  atorvastatin (LIPITOR) 40 MG tablet TAKE 1 Tablet BY MOUTH ONCE EVERY DAY 08/16/22   Jacquelin Hawking, PA-C  citalopram (CELEXA) 20 MG tablet Take 10 mg by mouth daily.    [provider]  cyclobenzaprine (FLEXERIL) 10 MG tablet Take 1 tablet (10 mg total) by mouth 2 (two) times daily as needed for muscle spasms. Patient not taking: Reported on 04/12/2022 02/28/22   Oliver Barre, MD  hydrocortisone 2.5 % cream Apply 1 Application topically daily as needed.     [provider]  hydrOXYzine (ATARAX) 25 MG tablet Take 1 tablet (25 mg total) by mouth every 6 (six) hours as needed for itching or anxiety. Patient not taking: Reported on 06/15/2022 05/19/22   Erick Blinks, MD  losartan (COZAAR) 100 MG tablet Take 100 mg by mouth daily.    [provider]  nystatin cream (MYCOSTATIN) Apply 1 Application topically at bedtime. Patient not taking: Reported on 11/21/2022    [provider]  traZODone (DESYREL) 100 MG tablet Take 100 mg by mouth at bedtime. Patient not taking: Reported on 05/16/2022 05/04/22   [provider]  triamcinolone cream (KENALOG) 0.1 % Apply 1 Application topically daily.    [provider]  triamcinolone cream (KENALOG) 0.5 % Apply 1 Application topically 2 (two) times daily as needed. Patient not taking: Reported on 11/21/2022 05/19/22   Erick Blinks, MD    Physical Exam    Vital Signs:  ARVELLA FRONDA does not have vital signs available for review today.  Given telephonic nature of communication, physical exam is limited. AAOx3. NAD. Normal affect.  Speech and respirations are unlabored.  Accessory Clinical Findings    None  Assessment & Plan    1.  Preoperative Cardiovascular Risk Assessment:  [name]'s perioperative risk of a major cardiac event is 0.4% according to the Revised Cardiac Risk Index (RCRI).  Therefore, she is at low risk for perioperative complications.   Her functional capacity is good at 5.07 METs according to the Duke Activity Status Index (DASI). Recommendations: According to ACC/AHA guidelines, no further cardiovascular testing needed.  The patient may proceed to surgery at acceptable risk.   Antiplatelet and/or Anticoagulation Recommendations: Aspirin can be held for 5-7 days prior to her surgery.  Please resume Aspirin post operatively when it is felt to be safe from a bleeding standpoint.   The patient was advised that if she develops new symptoms prior to  surgery to contact our office to arrange for a follow-up visit, and she verbalized understanding.  Amoxicillin is sent in today.   A copy of this note will be routed to requesting surgeon.  Time:   Today, I have spent 18 minutes with the patient with telehealth technology discussing medical history, symptoms, and management plan.     Sharlene Dory, PA-C  01/29/2023, 10:03 AM

## 2023-02-14 ENCOUNTER — Telehealth: Payer: Self-pay | Admitting: Orthopedic Surgery

## 2023-02-14 NOTE — Telephone Encounter (Signed)
Dr. Dallas Schimke pt - spoke w/the patient, she is wanting her xray of her back on a CD, she stated that Dr. Felecia Shelling is requesting this.  [phone]

## 2023-02-20 ENCOUNTER — Ambulatory Visit (INDEPENDENT_AMBULATORY_CARE_PROVIDER_SITE_OTHER): Payer: Medicaid Other

## 2023-02-20 ENCOUNTER — Ambulatory Visit: Payer: Medicaid Other | Admitting: Podiatry

## 2023-02-20 DIAGNOSIS — M21611 Bunion of right foot: Secondary | ICD-10-CM | POA: Diagnosis not present

## 2023-02-20 DIAGNOSIS — M21619 Bunion of unspecified foot: Secondary | ICD-10-CM

## 2023-02-20 DIAGNOSIS — B351 Tinea unguium: Secondary | ICD-10-CM | POA: Diagnosis not present

## 2023-02-20 MED ORDER — CICLOPIROX 8 % EX SOLN: Freq: Every day | CUTANEOUS | 2 refills | Status: DC

## 2023-02-20 NOTE — Patient Instructions (Signed)
You can use VOLTAREN GEL over the counter to apply as needed.  ---  Wear the bunion pad  ---  Wear shoes that are wider in the toe box or a shoe with a softer toe box area to help decrease pressure  --  Bunion A bunion (hallux valgus) is a bump that forms slowly on the inner side of the big toe joint. It occurs when the big toe turns toward the second toe. Bunions may be small at first, but they often get larger over time. They can make walking painful. What are the causes? This condition may be caused by: Wearing narrow or pointed shoes that force the big toe to press against the other toes. Abnormal foot development that causes the foot to roll inward. Changes in the foot that are caused by certain diseases, such as rheumatoid arthritis or polio. A foot injury. What increases the risk? The following factors may make you more likely to develop this condition: Wearing shoes that squeeze the toes together. Having certain diseases, such as: Rheumatoid arthritis. Polio. Cerebral palsy. Having family members who have bunions. Being born with abnormally shaped feet (a foot deformity), such as flat feet or low arches. Doing activities that put a lot of pressure on the feet, such as ballet dancing. What are the signs or symptoms?  The main symptom of this condition is a bump on your big toe that you can notice. Other symptoms may include: Pain. Redness and inflammation around your big toe. Thick or hardened skin on your big toe or between your toes. Stiffness or loss of motion in your big toe. Trouble with walking. How is this diagnosed? This condition may be diagnosed based on your symptoms, medical history, and activities. You may also have tests and imaging, such as: X-rays. These allow your health care provider to check the position of the bones in your foot and look for damage to your joint. They also help your health care provider determine the severity of your bunion and the  best way to treat it. Joint aspiration. In this test, a sample of fluid is removed from the toe joint. This test may be done if you are in a lot of pain. It helps rule out diseases that cause painful swelling of the joints, such as arthritis or gout. How is this treated? Treatment depends on the severity of your symptoms. The goal of treatment is to relieve symptoms and prevent your bunion from getting worse. Your health care provider may recommend: Wearing shoes that have a wide toe box, or using bunion pads to cushion the affected area. Taping your toes together to keep them in a normal position. Placing a device inside your shoe (orthotic device) to help reduce pressure on your toe joint. Taking medicine to ease pain and inflammation. Putting ice or heat on the affected area. Doing stretching exercises. Surgery, for severe cases. Follow these instructions at home: Managing pain, stiffness, and swelling     If directed, put ice on the painful area. To do this: Put ice in a plastic bag. Place a towel between your skin and the bag. Leave the ice on for 20 minutes, 2-3 times a day. Remove the ice if your skin turns bright red. This is very important. If you cannot feel pain, heat, or cold, you have a greater risk of damage to the area. If directed, apply heat to the affected area before you exercise. Use the heat source that your health care provider recommends, such  as a moist heat pack or a heating pad. Place a towel between your skin and the heat source. Leave the heat on for 20-30 minutes. Remove the heat if your skin turns bright red. This is especially important if you are unable to feel pain, heat, or cold. You have a greater risk of getting burned. General instructions Do exercises as told by your health care provider. Support your toe joint with proper footwear, shoe padding, or taping as told by your health care provider. Take over-the-counter and prescription medicines only as  told by your health care provider. Do not use any products that contain nicotine or tobacco, such as cigarettes, e-cigarettes, and chewing tobacco. If you need help quitting, ask your health care provider. Keep all follow-up visits. This is important. Contact a health care provider if: Your symptoms get worse. Your symptoms do not improve in 2 weeks. Get help right away if: You have severe pain and trouble with walking. Summary A bunion is a bump on the inner side of the big toe joint that forms when the big toe turns toward the second toe. Bunions can make walking painful. Treatment depends on the severity of your symptoms. Support your toe joint with proper footwear, shoe padding, or taping as told by your health care provider. This information is not intended to replace advice given to you by your health care provider. Make sure you discuss any questions you have with your health care provider. Document Revised: 01/09/2020 Document Reviewed: 01/09/2020 Elsevier Patient Education  2024 Elsevier Inc.   ---  Ciclopirox Topical Solution What is this medication? CICLOPIROX (sye kloe PEER ox) treats fungal infections of the nails. It belongs to a group of medications called antifungals. It will not treat infections caused by bacteria or viruses. This medicine may be used for other purposes; ask your health care provider or pharmacist if you have questions. COMMON BRAND NAME(S): Ciclodan Nail Solution, CNL8, Myochrysine, Penlac What should I tell my care team before I take this medication? They need to know if you have any of these conditions: Diabetes (high blood sugar) Immune system problems Organ transplant Receiving steroid inhalers, cream, or lotion Seizures Tingling of the fingers or toes or other nerve disorder An unusual or allergic reaction to ciclopirox, other medications, foods, dyes, or preservatives Pregnant or trying to get pregnant Breast-feeding How should I use this  medication? This medication is for external use only. Do not take by mouth. Wash your hands before and after use. If you are treating your hands, only wash your hands before use. Do not get it in your eyes. If you do, rinse your eyes with plenty of cool tap water. Use it as directed on the prescription label at the same time every day. Do not use it more often than directed. Use the medication for the full course as directed by your care team, even if you think you are better. Do not stop using it unless your care team tells you to stop it early. Apply a thin film of the medication to the affected area. Talk to your care team about the use of this medication in children. While it may be prescribed for children as young as 12 years for selected conditions, precautions do apply. Overdosage: If you think you have taken too much of this medicine contact a poison control center or emergency room at once. NOTE: This medicine is only for you. Do not share this medicine with others. What if I miss a dose?  If you miss a dose, use it as soon as you can. If it is almost time for your next dose, use only that dose. Do not use double or extra doses. What may interact with this medication? Interactions are not expected. Do not use any other skin products without telling your care team. This list may not describe all possible interactions. Give your health care provider a list of all the medicines, herbs, non-prescription drugs, or dietary supplements you use. Also tell them if you smoke, drink alcohol, or use illegal drugs. Some items may interact with your medicine. What should I watch for while using this medication? Visit your care team for regular checks on your progress. It may be some time before you see the benefit from this medication. Do not use nail polish or other nail cosmetic products on the treated nails. Removal of the unattached, infected nail by your care team is needed with use of this medication.  If you have diabetes or numbness in your fingers or toes, talk to your care team about proper nail care. What side effects may I notice from receiving this medication? Side effects that you should report to your care team as soon as possible: Allergic reactions--skin rash, itching, hives, swelling of the face, lips, tongue, or throat Burning, itching, crusting, or peeling of treated skin Side effects that usually do not require medical attention (report to your care team if they continue or are bothersome): Change in nail shape, thickness, or color Mild skin irritation, redness, or dryness This list may not describe all possible side effects. Call your doctor for medical advice about side effects. You may report side effects to FDA at 1-800-FDA-1088. Where should I keep my medication? Keep out of the reach of children and pets. Store at room temperature between 20 and 25 degrees C (68 and 77 degrees F). This medication is flammable. Avoid exposure to heat, fire, flame, and smoking. Get rid of medications that are no longer needed or have expired: Take the medication to a medication take-back program. Check with your pharmacy or law enforcement to find a location. If you cannot return the medication, check the label or package insert to see if the medication should be thrown out in the garbage or flushed down the toilet. If you are not sure, ask your care team. If it is safe to put in the trash, take the medication out of the container. Mix the medication with cat litter, dirt, coffee grounds, or other unwanted substance. Seal the mixture in a bag or container. Put it in the trash. NOTE: This sheet is a summary. It may not cover all possible information. If you have questions about this medicine, talk to your doctor, pharmacist, or health care provider.  2024 Elsevier/Gold Standard (2022-01-02 00:00:00)

## 2023-02-22 ENCOUNTER — Other Ambulatory Visit (HOSPITAL_COMMUNITY)
Admission: RE | Admit: 2023-02-22 | Discharge: 2023-02-22 | Disposition: A | Discharge status: Home / Self Care | Payer: Medicaid Other | Source: Ambulatory Visit | Attending: Internal Medicine | Admitting: Internal Medicine

## 2023-02-22 DIAGNOSIS — E785 Hyperlipidemia, unspecified: Secondary | ICD-10-CM | POA: Insufficient documentation

## 2023-02-22 LAB — LIPID PANEL
Cholesterol: 189 mg/dL (ref 0–200)
HDL: 45 mg/dL (ref >40)
LDL Cholesterol: 126 mg/dL — ABNORMAL HIGH (ref 0–99)
Total CHOL/HDL Ratio: 4.2 RATIO
Triglycerides: 90 mg/dL (ref <150)
VLDL: 18 mg/dL (ref 0–40)

## 2023-02-23 ENCOUNTER — Ambulatory Visit (INDEPENDENT_AMBULATORY_CARE_PROVIDER_SITE_OTHER): Payer: Medicaid Other | Admitting: Orthopedic Surgery

## 2023-02-23 ENCOUNTER — Encounter: Payer: Self-pay | Admitting: Orthopedic Surgery

## 2023-02-23 VITALS — BP 151/90 | HR 64 | Ht 63.0 in | Wt 164.0 lb

## 2023-02-23 DIAGNOSIS — M5416 Radiculopathy, lumbar region: Secondary | ICD-10-CM | POA: Diagnosis not present

## 2023-02-23 MED ORDER — PREDNISONE 10 MG (21) PO TBPK: ORAL_TABLET | ORAL | 0 refills | Status: DC

## 2023-02-23 MED ORDER — CYCLOBENZAPRINE HCL 10 MG PO TABS: 10.0000 mg | ORAL_TABLET | Freq: Two times a day (BID) | ORAL | 0 refills | Status: DC | PRN

## 2023-02-23 NOTE — Patient Instructions (Signed)

## 2023-02-23 NOTE — Progress Notes (Signed)
Return patient Visit  Assessment: Meghan Simmons is a 57 y.o. female with the following: 1. Lumbar pain, with radiculopathy  Plan: Meghan Simmons has persistence of her low back pain, with radiating pains to the right foot.  She has some tingling in the right foot.  Previous x-rays demonstrates degenerative changes at L5-S1.  Prednisone and Flexeril were helpful before.  We discussed proceeding with physical therapy, with possibility of an MRI, but she is not interested in therapy, she is concerned about transportation.  In addition, she is not interested in considering surgery.  We will hold off on physical therapy and a referral to a spine specialist.  Medications and home exercises have been provided.  She will follow-up as needed.  Follow-up: Return if symptoms worsen or fail to improve.  Subjective:  Chief Complaint  Patient presents with   Back Pain    LBP going down both legs but worse on the R     History of Present Illness: Meghan Simmons is a 57 y.o. female who returns to clinic today for repeat evaluation of low back pain.  I saw her in clinic about a year ago.  At that time, she took some prednisone and Flexeril.  This improves some of her symptoms.  However, she continues to have pain in the lower back, with radiating pains into both legs, right worse than left.  She has not worked physical therapy.  She did exercise on her own.  She continues to take ibuprofen or naproxen.    Review of Systems: No fevers or chills + numbness and tingling No chest pain No shortness of breath No bowel or bladder dysfunction No GI distress No headaches     Objective: BP (!) 151/90   Pulse 64   Ht 5\' 3"  (1.6 m)   Wt 164 lb (74.4 kg)   BMI 29.05 kg/m   Physical Exam:  General: Alert and oriented. and No acute distress. Gait: Slow, steady gait.  Tenderness to palpation of the lower back.  Negative straight leg raise bilaterally.  Sensation is intact distally.  Good strength  bilaterally.  IMAGING: No new imaging obtained today  New Medications:  Meds ordered this encounter  Medications   predniSONE (STERAPRED UNI-PAK 21 TAB) 10 MG (21) TBPK tablet    Sig: 10 mg DS 12 as directed    Dispense:  48 tablet    Refill:  0   cyclobenzaprine (FLEXERIL) 10 MG tablet    Sig: Take 1 tablet (10 mg total) by mouth 2 (two) times daily as needed.    Dispense:  20 tablet    Refill:  0      Oliver Barre, MD  02/23/2023 1:06 PM

## 2023-03-02 NOTE — Progress Notes (Signed)
Subjective:   Patient ID: Meghan Simmons, female   DOB: 57 y.o.   MRN: [id number]   HPI Chief Complaint  Patient presents with   Bunions    Patient came in today for right foot bunion, started 4 years ago, in the last year it has become more painful, rate of pain 3 out of 10, X-Rays done today      Nail Problem    Nail fungus, nails are thick and yellow, patient denies any pain, bilateral hallux and 5th are the worse    57 year old female presents the office with above concerns.  She states she is having bunion pain which started 4 years ago and over the last year it has been getting worse.  No recent injuries.  No treatment.  No pain in the toenails.  No other concerns.   Review of Systems  All other systems reviewed and are negative.  Past Medical History:  Diagnosis Date   Atrial septal defect    Heart murmur    Hypertension     Past Surgical History:  Procedure Laterality Date   CHOLECYSTECTOMY     TEE WITHOUT CARDIOVERSION N/A 11/27/2014   Procedure: TRANSESOPHAGEAL ECHOCARDIOGRAM (TEE);  Surgeon: Pricilla Riffle, MD;  Location: [facility] ENDOSCOPY;  Service: Cardiovascular;  Laterality: N/A;     Current Outpatient Medications:    ciclopirox (PENLAC) 8 % solution, Apply topically at bedtime. Apply over nail and surrounding skin. Apply daily over previous coat. After seven (7) days, may remove with alcohol and continue cycle. (Patient not taking: Reported on 02/23/2023), Disp: 6.6 mL, Rfl: 2   aspirin EC 81 MG tablet, Take 1 tablet (81 mg total) by mouth daily. Swallow whole., Disp: 30 tablet, Rfl: 6   atorvastatin (LIPITOR) 40 MG tablet, TAKE 1 Tablet BY MOUTH ONCE EVERY DAY, Disp: 90 tablet, Rfl: 0   citalopram (CELEXA) 20 MG tablet, Take 10 mg by mouth daily., Disp: , Rfl:    cyclobenzaprine (FLEXERIL) 10 MG tablet, Take 1 tablet (10 mg total) by mouth 2 (two) times daily as needed., Disp: 20 tablet, Rfl: 0   hydrocortisone 2.5 % cream, Apply 1 Application topically daily as needed.  (Patient not taking: Reported on 02/23/2023), Disp: , Rfl:    hydrOXYzine (ATARAX) 25 MG tablet, Take 1 tablet (25 mg total) by mouth every 6 (six) hours as needed for itching or anxiety. (Patient not taking: Reported on 06/15/2022), Disp: 30 tablet, Rfl: 0   losartan (COZAAR) 100 MG tablet, Take 100 mg by mouth daily., Disp: , Rfl:    nystatin cream (MYCOSTATIN), Apply 1 Application topically at bedtime. (Patient not taking: Reported on 11/21/2022), Disp: , Rfl:    predniSONE (STERAPRED UNI-PAK 21 TAB) 10 MG (21) TBPK tablet, 10 mg DS 12 as directed, Disp: 48 tablet, Rfl: 0   traZODone (DESYREL) 100 MG tablet, Take 100 mg by mouth at bedtime. (Patient not taking: Reported on 05/16/2022), Disp: , Rfl:    triamcinolone cream (KENALOG) 0.1 %, Apply 1 Application topically daily. (Patient not taking: Reported on 02/23/2023), Disp: , Rfl:    triamcinolone cream (KENALOG) 0.5 %, Apply 1 Application topically 2 (two) times daily as needed. (Patient not taking: Reported on 11/21/2022), Disp: 454 g, Rfl: 0  Allergies  Allergen Reactions   Erythromycin Nausea And Vomiting and Rash          Objective:  Physical Exam  General: AAO x3, NAD  Dermatological: Nails are hypertrophic, dystrophic with yellow discoloration.  No edema, erythema to  the toenail sites.  No open lesions.  Vascular: Dorsalis Pedis artery and Posterior Tibial artery pedal pulses are 2/4 bilateral with immedate capillary fill time. There is no pain with calf compression, swelling, warmth, erythema.   Neruologic: Grossly intact via light touch bilateral.   Musculoskeletal: Moderate to severe bunion present on the right foot with tenderness palpation directly on the first MPJ medially.  There is mild erythema of the irritation but there is no skin breakdown or warmth or any signs of infection.  Gait: Unassisted, Nonantalgic.       Assessment:   57 year old female with bunion deformity, onychomycosis      Plan:  -Treatment options  discussed including all alternatives, risks, and complications -Etiology of symptoms were discussed -X-rays were obtained and reviewed with the patient.  Moderate bunion deformity is noted with osteophyte along the medial first MPJ. -In regards to the bunion we discussed both conservative as well as surgical treatment options.  Will start conservative treatment.  Dispensed bunion pads.  Discussed softer toebox shoes to avoid excess pressure.  Discussed Voltaren gel.  Offered steroid injection as needed. -Discussed treatment options for nail fungus with oral, topical medications.  Discussed length of therapy and success rates. Decided on Penlac.   Vivi Barrack DPM

## 2023-03-06 ENCOUNTER — Telehealth: Payer: Self-pay

## 2023-03-06 DIAGNOSIS — E785 Hyperlipidemia, unspecified: Secondary | ICD-10-CM

## 2023-03-06 NOTE — Telephone Encounter (Signed)
Patient notified and verbalized understanding. Patient declined Leqvio injections. Pt stated she will continue with Crestor and have lab work in 8 weeks.

## 2023-03-06 NOTE — Telephone Encounter (Signed)
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

## 2023-03-16 ENCOUNTER — Other Ambulatory Visit: Payer: Self-pay | Admitting: Podiatry

## 2023-03-16 DIAGNOSIS — M21619 Bunion of unspecified foot: Secondary | ICD-10-CM

## 2023-03-16 DIAGNOSIS — B351 Tinea unguium: Secondary | ICD-10-CM

## 2023-03-20 ENCOUNTER — Encounter: Payer: Self-pay | Admitting: *Deleted

## 2023-05-04 IMAGING — DX DG CHEST 2V
2 series · 2 of 2 positions shown · non-contrast
Comparison: Chest radiograph 09/13/2016

CLINICAL DATA: Chest pain goes into left arm and back

EXAM:
CHEST - 2 VIEW

[chest pa]
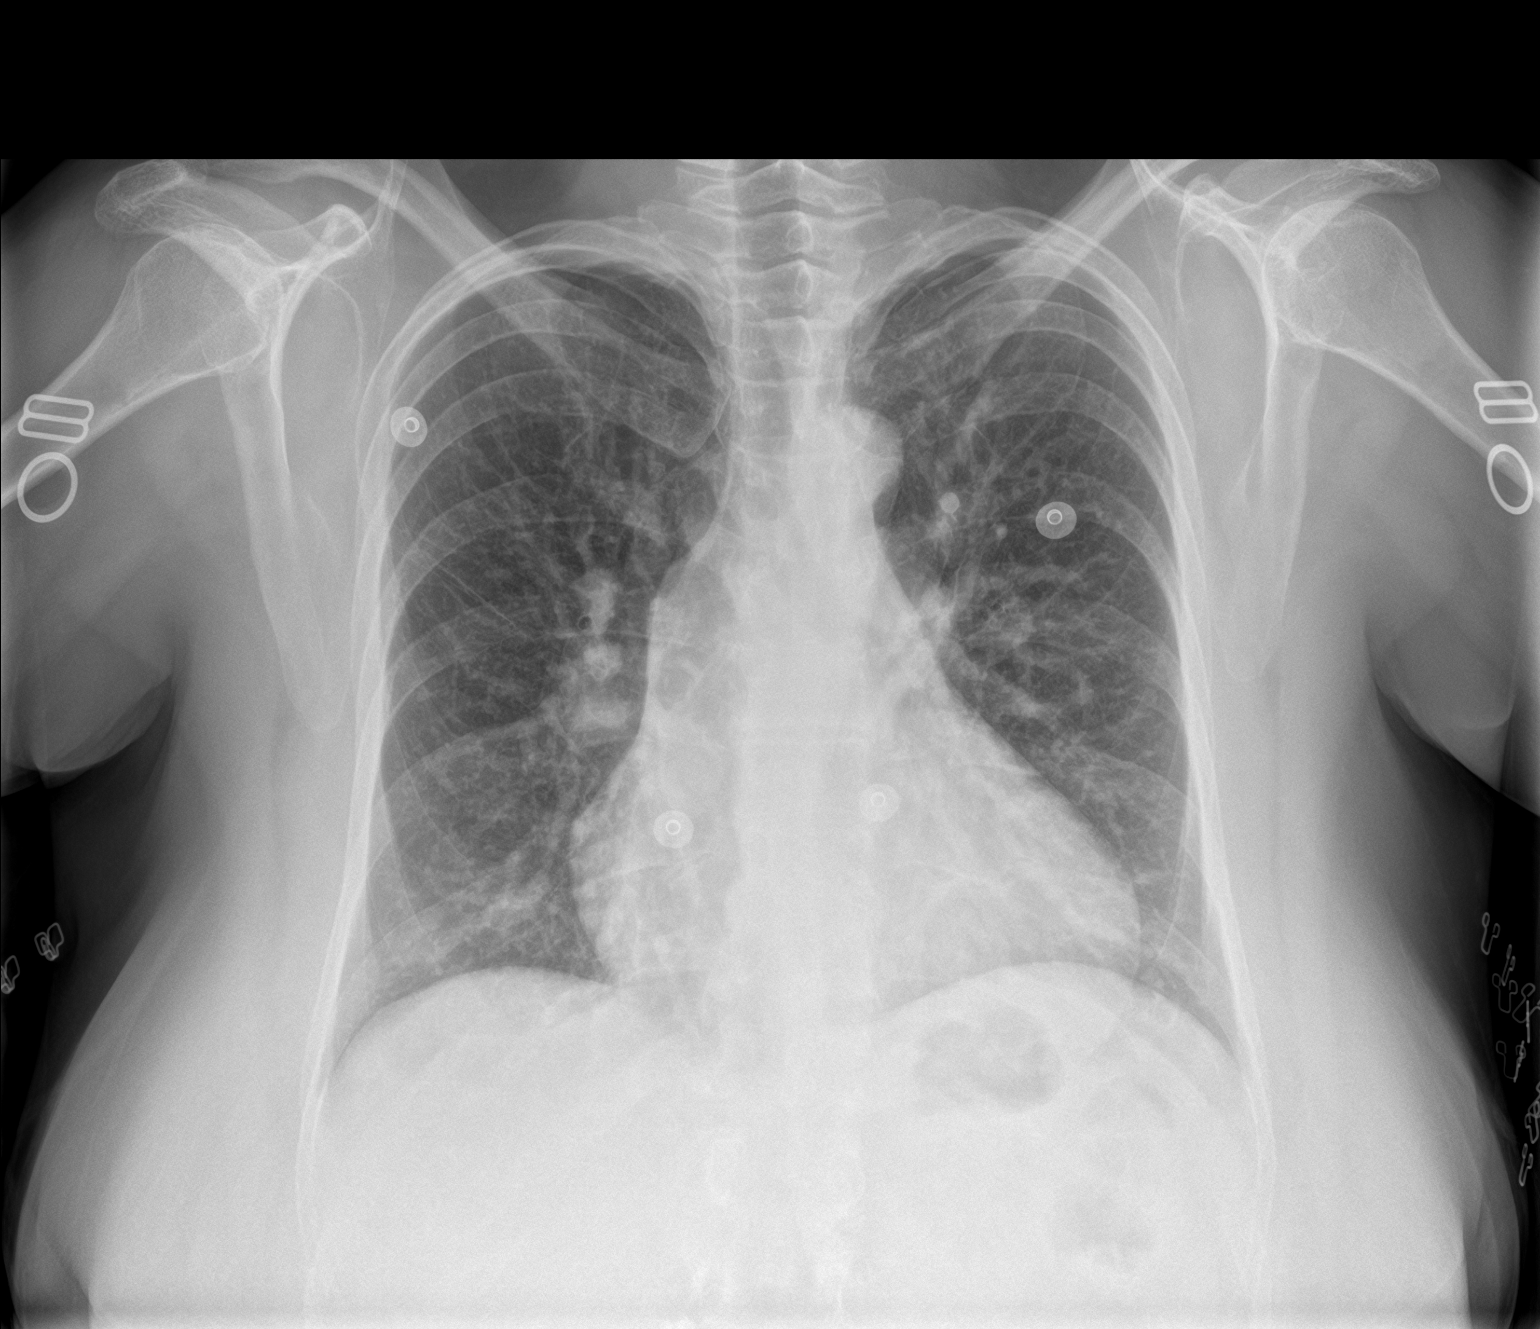

[chest lat]
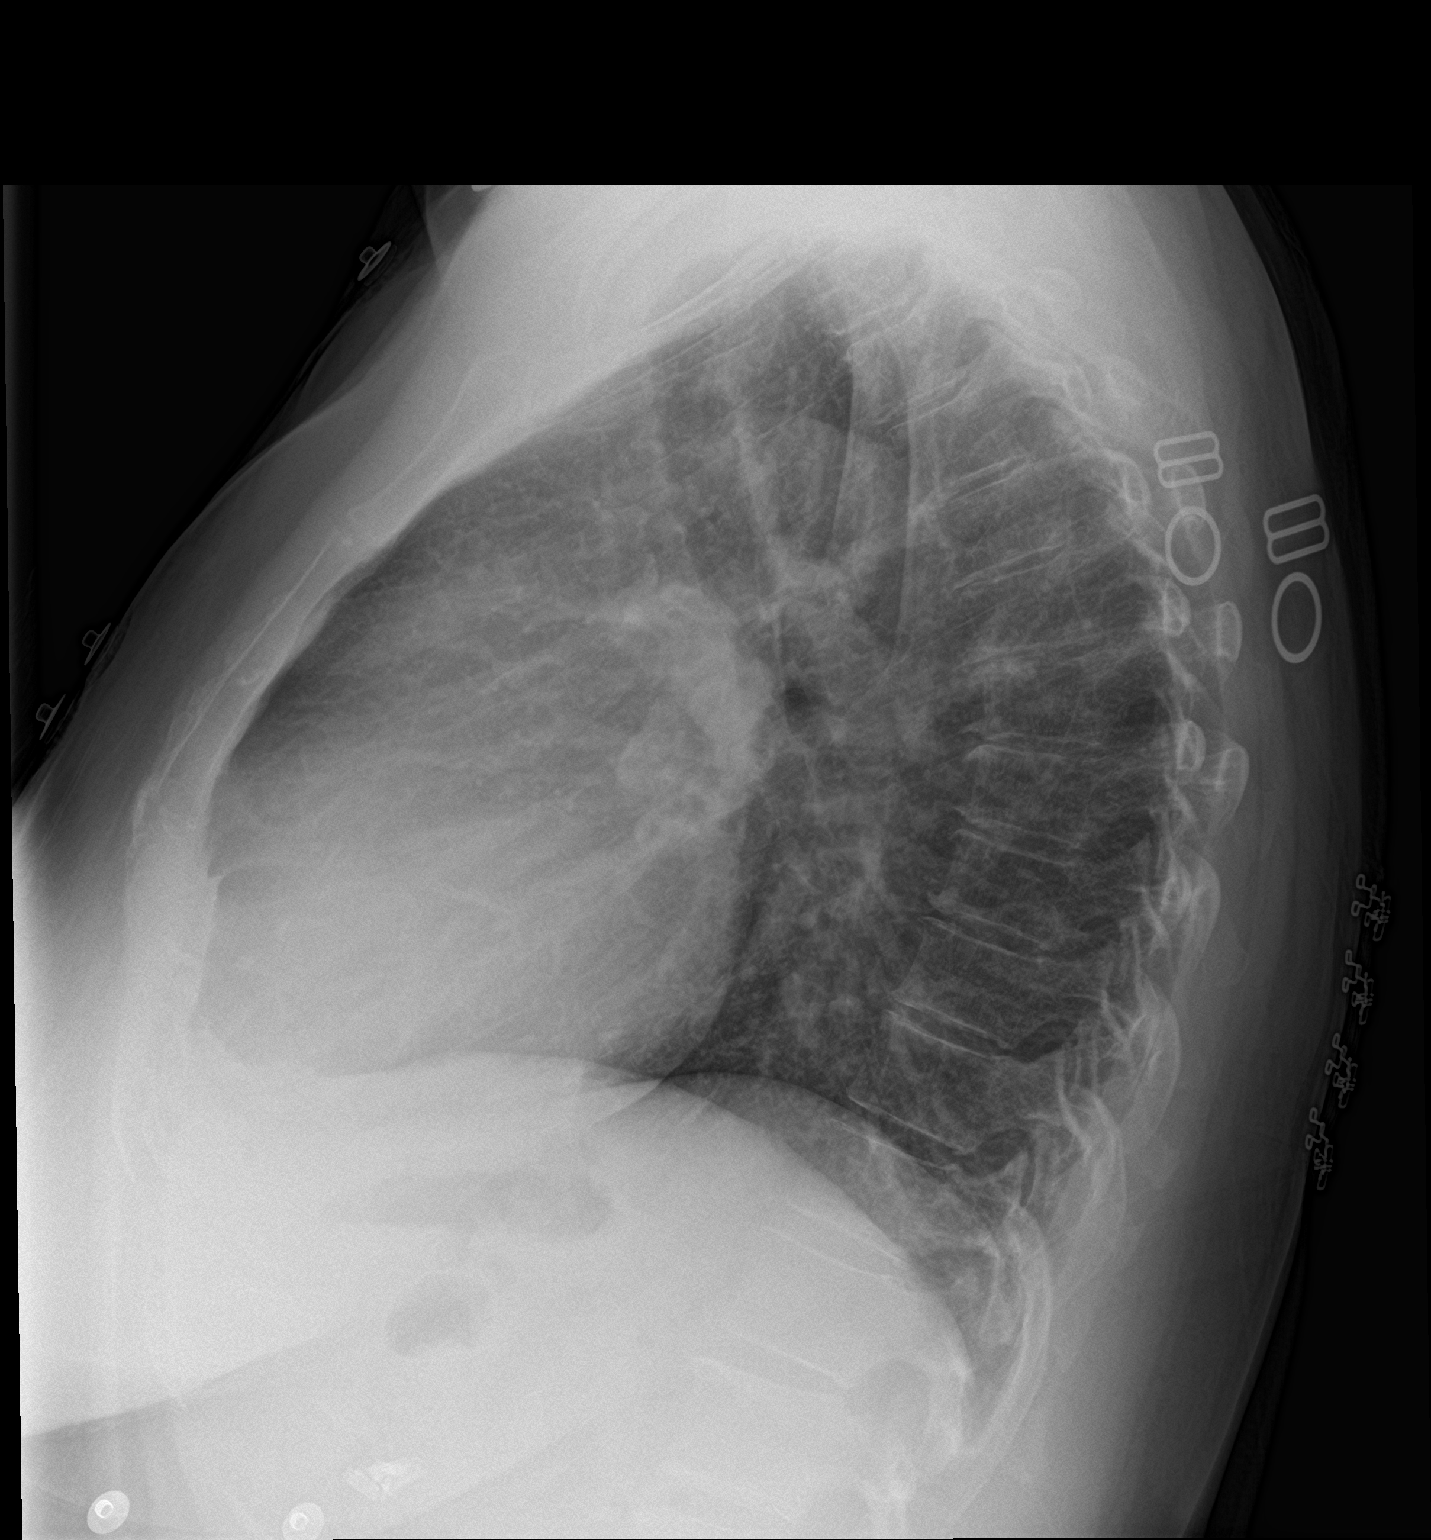

[2 of 2 positions shown; findings below may reference images not displayed]

FINDINGS: The heart is mildly enlarged, unchanged. The mediastinal contours
are within normal limits.

The pulmonary arteries are prominent, unchanged. There is vascular
congestion without overt pulmonary edema. There is no focal
consolidation. There is no pleural effusion or pneumothorax.

There is no acute osseous abnormality.
IMPRESSION: Unchanged cardiomegaly and prominent pulmonary arteries.

Mild vascular congestion without overt pulmonary edema.

## 2023-05-11 ENCOUNTER — Other Ambulatory Visit (HOSPITAL_COMMUNITY)
Admission: RE | Admit: 2023-05-11 | Discharge: 2023-05-11 | Disposition: A | Discharge status: Home / Self Care | Payer: Medicaid Other | Source: Ambulatory Visit | Attending: Internal Medicine | Admitting: Internal Medicine

## 2023-05-11 DIAGNOSIS — E785 Hyperlipidemia, unspecified: Secondary | ICD-10-CM | POA: Diagnosis present

## 2023-05-11 LAB — HEPATIC FUNCTION PANEL
ALT: 17 U/L (ref 0–44)
AST: 16 U/L (ref 15–41)
Albumin: 3.7 g/dL (ref 3.5–5.0)
Alkaline Phosphatase: 92 U/L (ref 38–126)
Bilirubin, Direct: <0.1 mg/dL (ref 0.0–0.2)
Total Bilirubin: 0.5 mg/dL (ref 0.3–1.2)
Total Protein: 6.9 g/dL (ref 6.5–8.1)

## 2023-05-13 LAB — NMR, LIPOPROFILE
Cholesterol, Total: 172 mg/dL (ref 100–199)
HDL Cholesterol by NMR: 54 mg/dL (ref >39)
HDL Particle Number: 30.5 umol/L (ref >=30.5)
LDL Particle Number: 1146 nmol/L — ABNORMAL HIGH (ref <1000)
LDL Size: 21.6 nm (ref >20.5)
LDL-C (NIH Calc): 102 mg/dL — ABNORMAL HIGH (ref 0–99)
LP-IR Score: <25 (ref <=45)
Small LDL Particle Number: 230 nmol/L (ref <=527)
Triglycerides by NMR: 84 mg/dL (ref 0–149)

## 2023-05-15 ENCOUNTER — Telehealth: Payer: Self-pay | Admitting: Pharmacist

## 2023-05-15 NOTE — Telephone Encounter (Signed)
Spoke to patient, does not have transportation so can not come to lipid clinic. She states she has been taking Lipitor 40 mg daily but last fill date is 07/2022 for 90 days supply. She still has some pills. Ezetimibe after trying for couple months she started having rash from it. She is afraid of needles so prefers oral agent   Patient gets help for transportation so will check with them if she can come to [facility] for her lipid appointment. Patient requesting call back from me after 12:00 pm.

## 2023-05-30 ENCOUNTER — Ambulatory Visit: Payer: Medicaid Other | Attending: Internal Medicine | Admitting: Pharmacist

## 2023-05-30 ENCOUNTER — Other Ambulatory Visit (HOSPITAL_COMMUNITY): Payer: Self-pay

## 2023-05-30 DIAGNOSIS — E782 Mixed hyperlipidemia: Secondary | ICD-10-CM

## 2023-05-30 MED ORDER — ATORVASTATIN CALCIUM 80 MG PO TABS: 80.0000 mg | ORAL_TABLET | Freq: Every day | ORAL | 3 refills | Status: AC

## 2023-05-30 MED ORDER — LOSARTAN POTASSIUM 100 MG PO TABS: 100.0000 mg | ORAL_TABLET | Freq: Every day | ORAL | 3 refills | Status: AC

## 2023-05-30 MED ORDER — METHYLPREDNISOLONE 4 MG PO TABS: ORAL_TABLET | ORAL | 0 refills | Status: DC

## 2023-05-30 NOTE — Patient Instructions (Addendum)
Your LDL cholesterol is 102, your goal is < 70  Start taking a higher dose of atorvastatin 80mg  - 1 tablet every day  Recheck labs when you see Dr Tenny Craw in November  Do not take amoxicillin, Augmentin, penicillin, or other medications in that drug class due to allergic reaction  I have sent in a prescription for a steroid dose pack  You can take an antihistamine like Zyrtec, Allegra, or Claritin as well

## 2023-05-30 NOTE — Progress Notes (Signed)
Patient ID: Meghan Simmons                 DOB: 1966-06-23                    MRN: [id number]     HPI: Meghan Simmons is a 57 y.o. female patient referred to lipid clinic by Dr Tenny Craw. PMH is significant for HTN, ASD s/p repair 09/2021 and closure 10/2021 at [facility], coronary CT 01/2015 with elevated calcium score of 143 (98th percentile). 03/06/23 phone note states pt declined Leqvio injections and would continue on Crestor, however her med list shows she's prescribed Lipitor.  Pt brings in home pill bottles today. Has a bottle of atorvastatin 40mg  daily, however it was dispensed in January 2024 and has not been refilled since then per dispense report. Pt mentions she had been taking it more consistently lately and combining other bottles from home, but she still has only filled it once in the last 8 months so med adherence is likely suboptimal. No issues tolerating atorvastatin. Prior rash on Zetia. Does not wish to give injections. Reports doesn't consistently have money for medications. She has Medicaid. Continues to smoke.  Main concern today is a full body rash due to penicillin allergy. States she took 4 amox prior to dental extractions on 8/27, they gave her amox to continue after, broke out in full body rash on 8/31. Rash covers her arms, legs, neck, and torso today and is red and painful. Skin is peeling as well, pt reports prior blisters. Also was sick on her stomach initially. Saw her PCP on 9/5 who gave her prednisone 20mg  x 7 days, she reports some improvement in rash but it still looks bad today. Took some Benadryl initially. Reports she has taken penicillin since the age of 24, no issues then however did take Augmentin in 2023 and reports she was in the hospital for 4 days due to full body rash even worse than this one, also accompanied by breathing issues initially. States they told her it was from the medication. No idea how this was never flagged as an allergy in Epic but I have updated this with high  alert today so that she never receives penicillin product going forward.   Current Medications: atorvastatin 40mg  daily Intolerances: ezetimibe - rash Risk Factors: CAD on coronary CT LDL goal: 70mg /dL  Family History: The patient's family history includes Depression in her mother; Diabetes in her mother; Heart murmur in her mother; Hypertension in her mother; Kidney failure (age of onset: 45) in her father.   Social History: The patient  reports that she has been smoking cigarettes. She started smoking about 16 years ago. She has a 11.25 pack-year smoking history. She has never used smokeless tobacco. She reports that she does not currently use alcohol. She reports that she does not currently use drugs after having used the following drugs: Marijuana and "Crack" cocaine.   Labs: 05/11/23: TC 172, HDL 54, TG 84, LDL 102, LDL particle # 1146  Past Medical History:  Diagnosis Date   Atrial septal defect    Heart murmur    Hypertension     Current Outpatient Medications on File Prior to Visit  Medication Sig Dispense Refill   aspirin EC 81 MG tablet Take 1 tablet (81 mg total) by mouth daily. Swallow whole. 30 tablet 6   atorvastatin (LIPITOR) 40 MG tablet TAKE 1 Tablet BY MOUTH ONCE EVERY DAY 90 tablet 0   ciclopirox (  PENLAC) 8 % solution Apply topically at bedtime. Apply over nail and surrounding skin. Apply daily over previous coat. After seven (7) days, may remove with alcohol and continue cycle. (Patient not taking: Reported on 02/23/2023) 6.6 mL 2   citalopram (CELEXA) 20 MG tablet Take 10 mg by mouth daily.     cyclobenzaprine (FLEXERIL) 10 MG tablet Take 1 tablet (10 mg total) by mouth 2 (two) times daily as needed. 20 tablet 0   hydrocortisone 2.5 % cream Apply 1 Application topically daily as needed. (Patient not taking: Reported on 02/23/2023)     hydrOXYzine (ATARAX) 25 MG tablet Take 1 tablet (25 mg total) by mouth every 6 (six) hours as needed for itching or anxiety. (Patient not  taking: Reported on 06/15/2022) 30 tablet 0   losartan (COZAAR) 100 MG tablet Take 100 mg by mouth daily.     nystatin cream (MYCOSTATIN) Apply 1 Application topically at bedtime. (Patient not taking: Reported on 11/21/2022)     predniSONE (STERAPRED UNI-PAK 21 TAB) 10 MG (21) TBPK tablet 10 mg DS 12 as directed 48 tablet 0   traZODone (DESYREL) 100 MG tablet Take 100 mg by mouth at bedtime. (Patient not taking: Reported on 05/16/2022)     triamcinolone cream (KENALOG) 0.1 % Apply 1 Application topically daily. (Patient not taking: Reported on 02/23/2023)     triamcinolone cream (KENALOG) 0.5 % Apply 1 Application topically 2 (two) times daily as needed. (Patient not taking: Reported on 11/21/2022) 454 g 0   No current facility-administered medications on file prior to visit.    Allergies  Allergen Reactions   Erythromycin Nausea And Vomiting and Rash    Assessment/Plan:  1. Hyperlipidemia - LDL 102 above goal < 70, supposed to be taking atorvastatin 40mg  daily however suboptimal adherence noted. Atorvastatin was last filled at her pharmacy in January 2024 for a 30 day supply. Discussed need for continued med adherence. Reports issues with medication cost, however she already has Medicaid so copays should be quite low (confirmed 90 day supply of atorvastatin is $4), do not have ways to lower further. She also continues to smoke. Ideally would quit smoking and use this money to put towards medication copays instead to improve her health. Will refill atorvastatin at higher dose of 80mg  daily and recheck lipids this fall. Prior rash on ezetimibe and does not wish to give injections. Could consider Nexletol if needed at that time.  2. Drug-induced rash - Main issue today, pt presents with full body bright red blistering/peeling rash secondary to recent amoxicillin use. Allergy list has been updated in detail. Her PCP sent in a week of prednisone however rash is still very red and painful. Will send in Medrol  dose pack. Advised pt to resume 2nd generation antihistamine as well and to follow up with her PCP if rash does not improve.  Meghan Drumwright E. Willisha Sligar, PharmD, BCACP, CPP  [facility] [address] [place], [place] [address] Phone: [phone]; Fax: [phone] 05/30/2023 3:00 PM

## 2023-06-11 ENCOUNTER — Telehealth: Payer: Self-pay | Admitting: Internal Medicine

## 2023-06-11 NOTE — Telephone Encounter (Signed)
Pt c/o medication issue:  1. Name of Medication:   methylPREDNISolone (MEDROL) 4 MG tablet    2. How are you currently taking this medication (dosage and times per day)? No longer taking the medication due to finishing it   3. Are you having a reaction (difficulty breathing--STAT)? Yes  4. What is your medication issue? Patient is calling stating two days after stopping this medication the rash flared back up. She also c/o swelling in her legs, but is unsure of weight gain from it. She is requesting a callback from [facility] regarding this and states she has been taking Allegra as advised, but it has not helped. Please advise.

## 2023-06-11 NOTE — Telephone Encounter (Signed)
I had sent in Medrol dose pack at last visit on 9/11 for residual rash secondary to penicillin allergy. She had already been evaluated by her PCP at that time and given initial 5 day course of prednisone. Called pt and advised her to contact PCP for further management at this time as her rash has now been present for almost a month. She verbalized understanding and will call her PCP.

## 2023-07-13 ENCOUNTER — Encounter: Payer: Self-pay | Admitting: Orthopedic Surgery

## 2023-07-13 ENCOUNTER — Ambulatory Visit: Payer: Medicaid Other | Admitting: Orthopedic Surgery

## 2023-07-13 VITALS — BP 163/95 | HR 77 | Ht 64.0 in | Wt 174.4 lb

## 2023-07-13 DIAGNOSIS — M5416 Radiculopathy, lumbar region: Secondary | ICD-10-CM

## 2023-07-13 MED ORDER — CYCLOBENZAPRINE HCL 10 MG PO TABS: 10.0000 mg | ORAL_TABLET | Freq: Two times a day (BID) | ORAL | 0 refills | Status: DC | PRN

## 2023-07-13 MED ORDER — PREDNISONE 10 MG (21) PO TBPK: ORAL_TABLET | ORAL | 0 refills | Status: DC

## 2023-07-13 NOTE — Progress Notes (Signed)
Return patient Visit  Assessment: Meghan Simmons is a 57 y.o. female with the following: 1. Lumbar pain, with radiculopathy  Plan: Meghan Simmons has had some recent worsening of the lower back pain, but this time it is affecting the left side.  She has radiating pains into the left foot.  We have previously discussed therapy, but she is unable to attend this regularly.  She did specifically ask for some hydrocodone, but I explained to her that this is not appropriate.  I am recommending a repeat course of prednisone, as well as Flexeril.  If she continues to have issues she is going to have to be evaluated by a back specialist.   Follow-up: Return if symptoms worsen or fail to improve.  Subjective:  Chief Complaint  Patient presents with   Back Pain    LBP with pain radiating down the left leg and foot.    History of Present Illness: Meghan Simmons is a 57 y.o. female who returns to clinic today for repeat evaluation of low back pain.  I have seen her in clinic several times.  She continues to have pain in the lower back.  At this point, she states that she is having more pain on the left side, with radiating pains into the left foot which has been ongoing for about 3 weeks.  Nothing has changed recently.  She states that she recently took some prednisone due to some issues related to an antibiotic, and she had with some swelling in her legs.  She remains uninterested in therapy.  She states that she took some hydrocodone provided to her by a friend, and this was helpful.   Review of Systems: No fevers or chills + numbness and tingling No chest pain No shortness of breath No bowel or bladder dysfunction No GI distress No headaches     Objective: BP (!) 163/95   Pulse 77   Ht 5\' 4"  (1.626 m)   Wt 174 lb 6 oz (79.1 kg)   BMI 29.93 kg/m   Physical Exam:  General: Alert and oriented. and No acute distress. Gait: Slow, steady gait.  Tenderness to palpation of the lower  back.  Negative straight leg raise bilaterally.  3+ patellar tendon reflexes bilaterally.  Good strength, with questionable effort.  Sensation is intact in the right leg.  She states that slightly decreased in the lateral thigh on the left.   IMAGING: No new imaging obtained today  New Medications:  Meds ordered this encounter  Medications   predniSONE (STERAPRED UNI-PAK 21 TAB) 10 MG (21) TBPK tablet    Sig: 10 mg DS 12 as directed    Dispense:  48 tablet    Refill:  0   cyclobenzaprine (FLEXERIL) 10 MG tablet    Sig: Take 1 tablet (10 mg total) by mouth 2 (two) times daily as needed.    Dispense:  20 tablet    Refill:  0      Oliver Barre, MD  07/13/2023 10:10 AM

## 2023-07-26 IMAGING — DX DG HIP (WITH OR WITHOUT PELVIS) 2-3V*R*
3 series · 3 of 3 positions shown · non-contrast
Comparison: None.

CLINICAL DATA: Right hip pain radiating down right leg since
06/30/2021. No injury.

EXAM:
DG HIP (WITH OR WITHOUT PELVIS) 2-3V RIGHT

[pelvis ap]
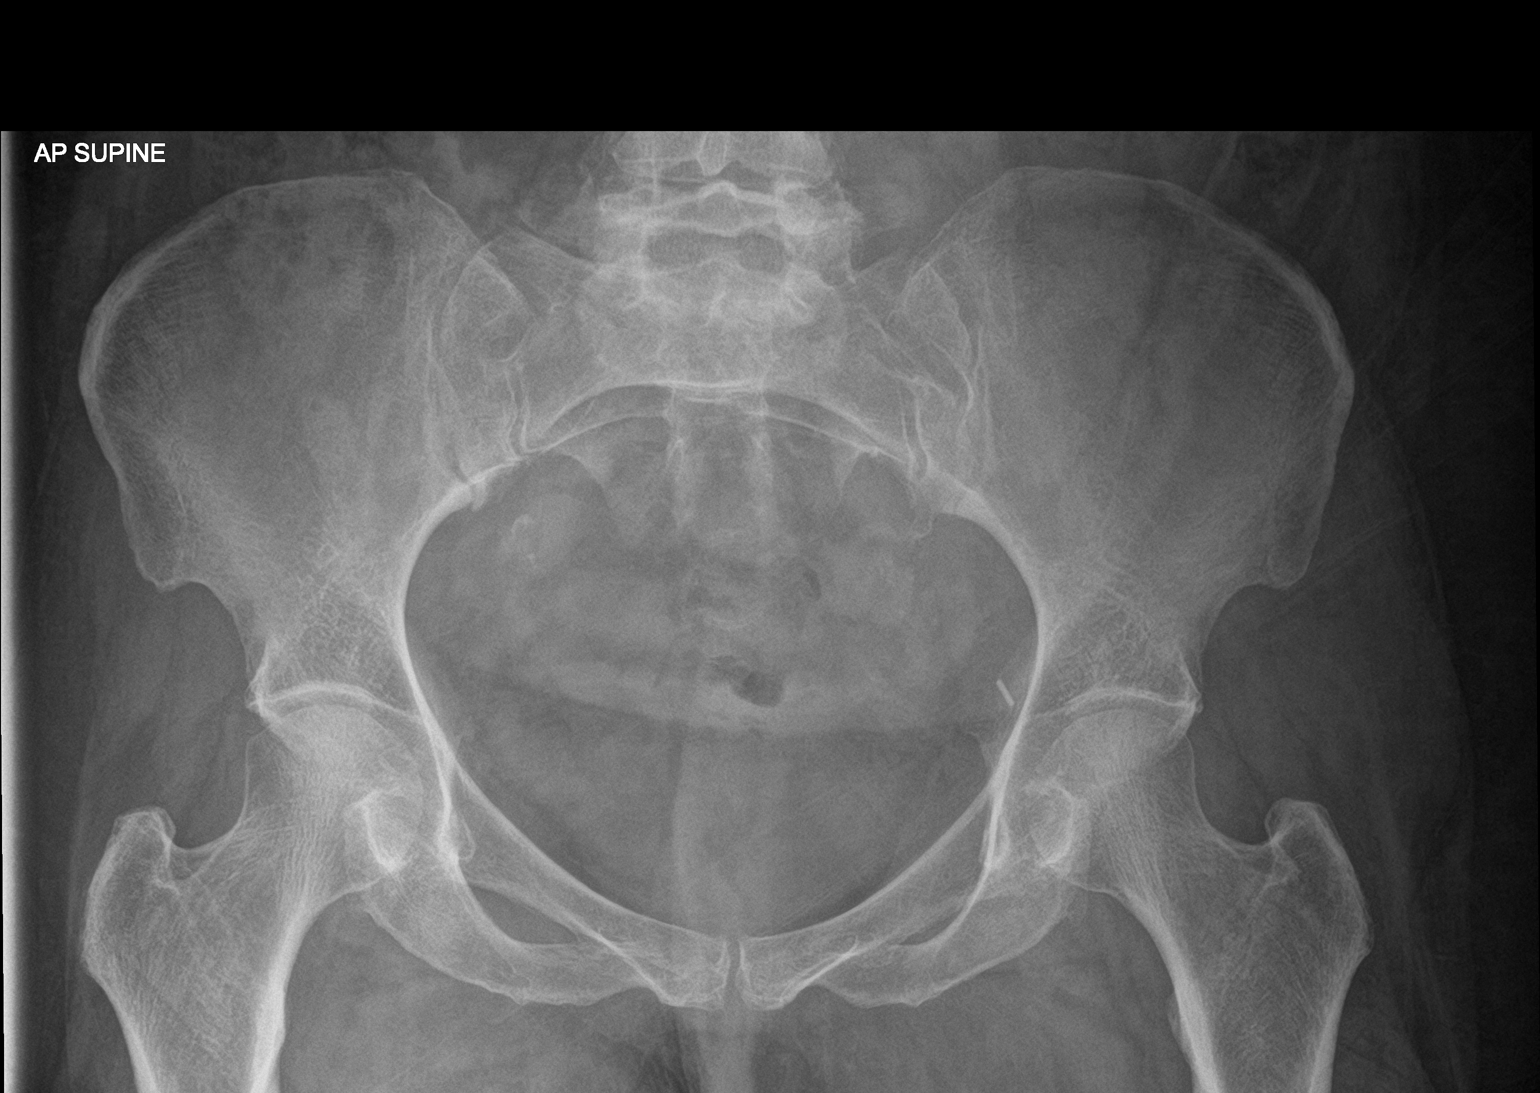

[hip ap]
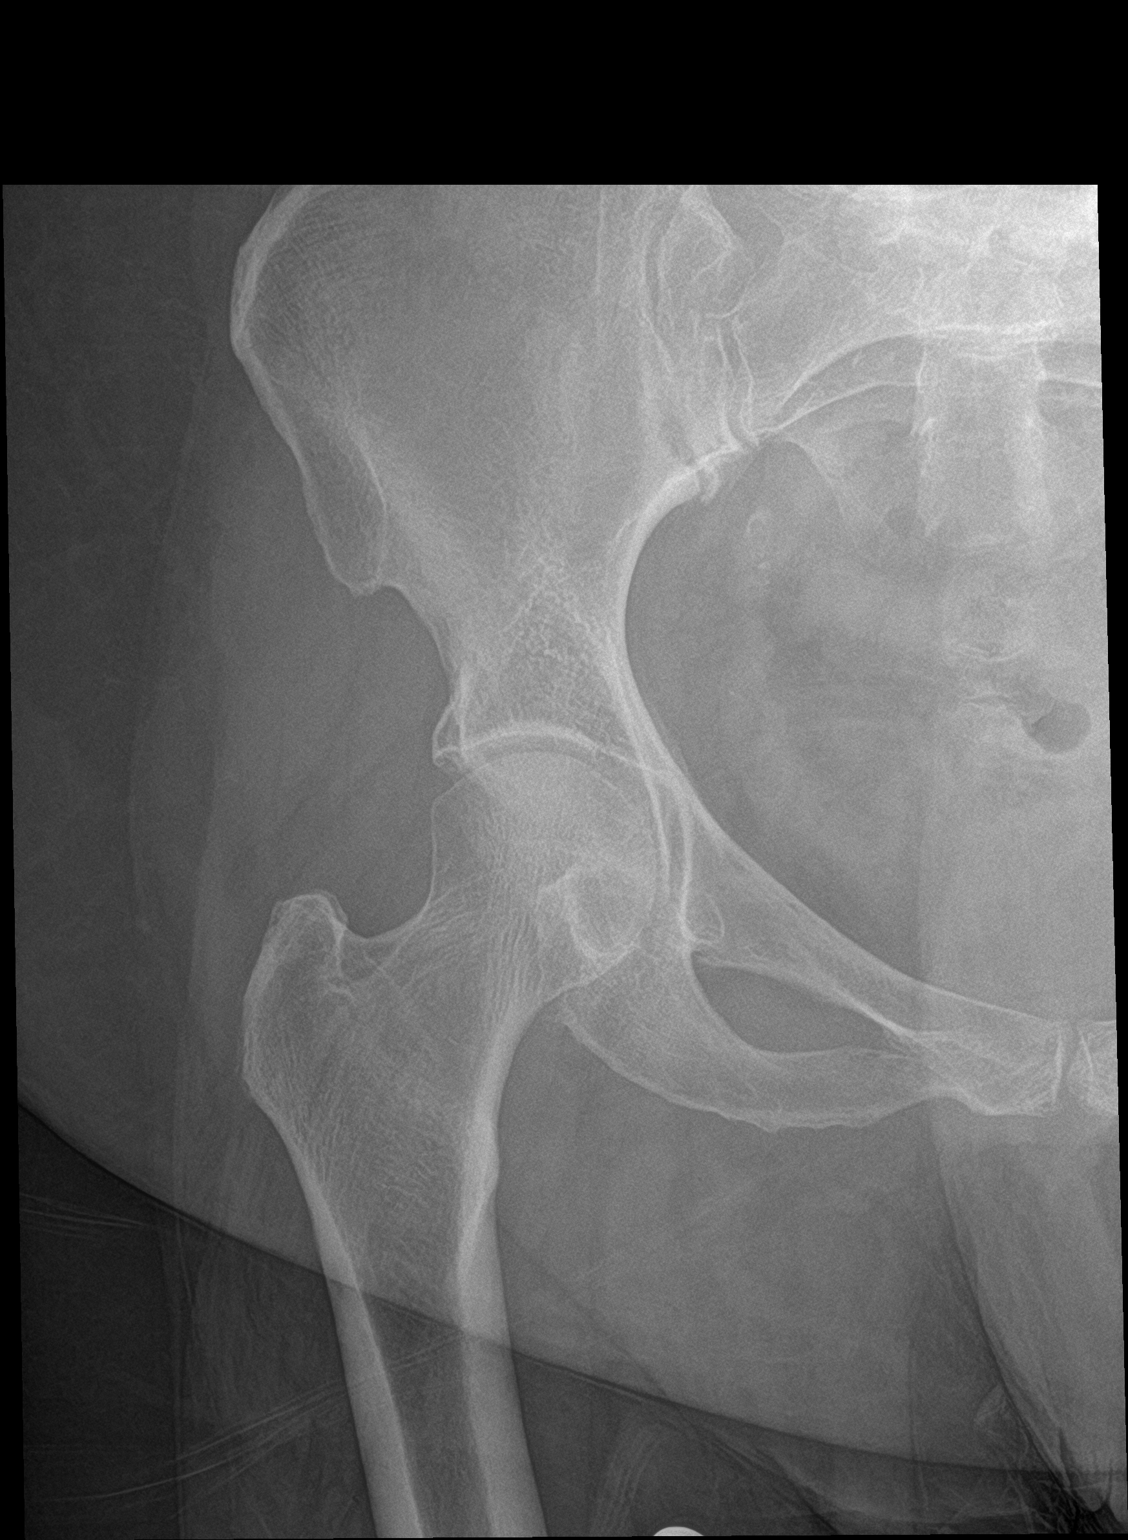

[hip lat]
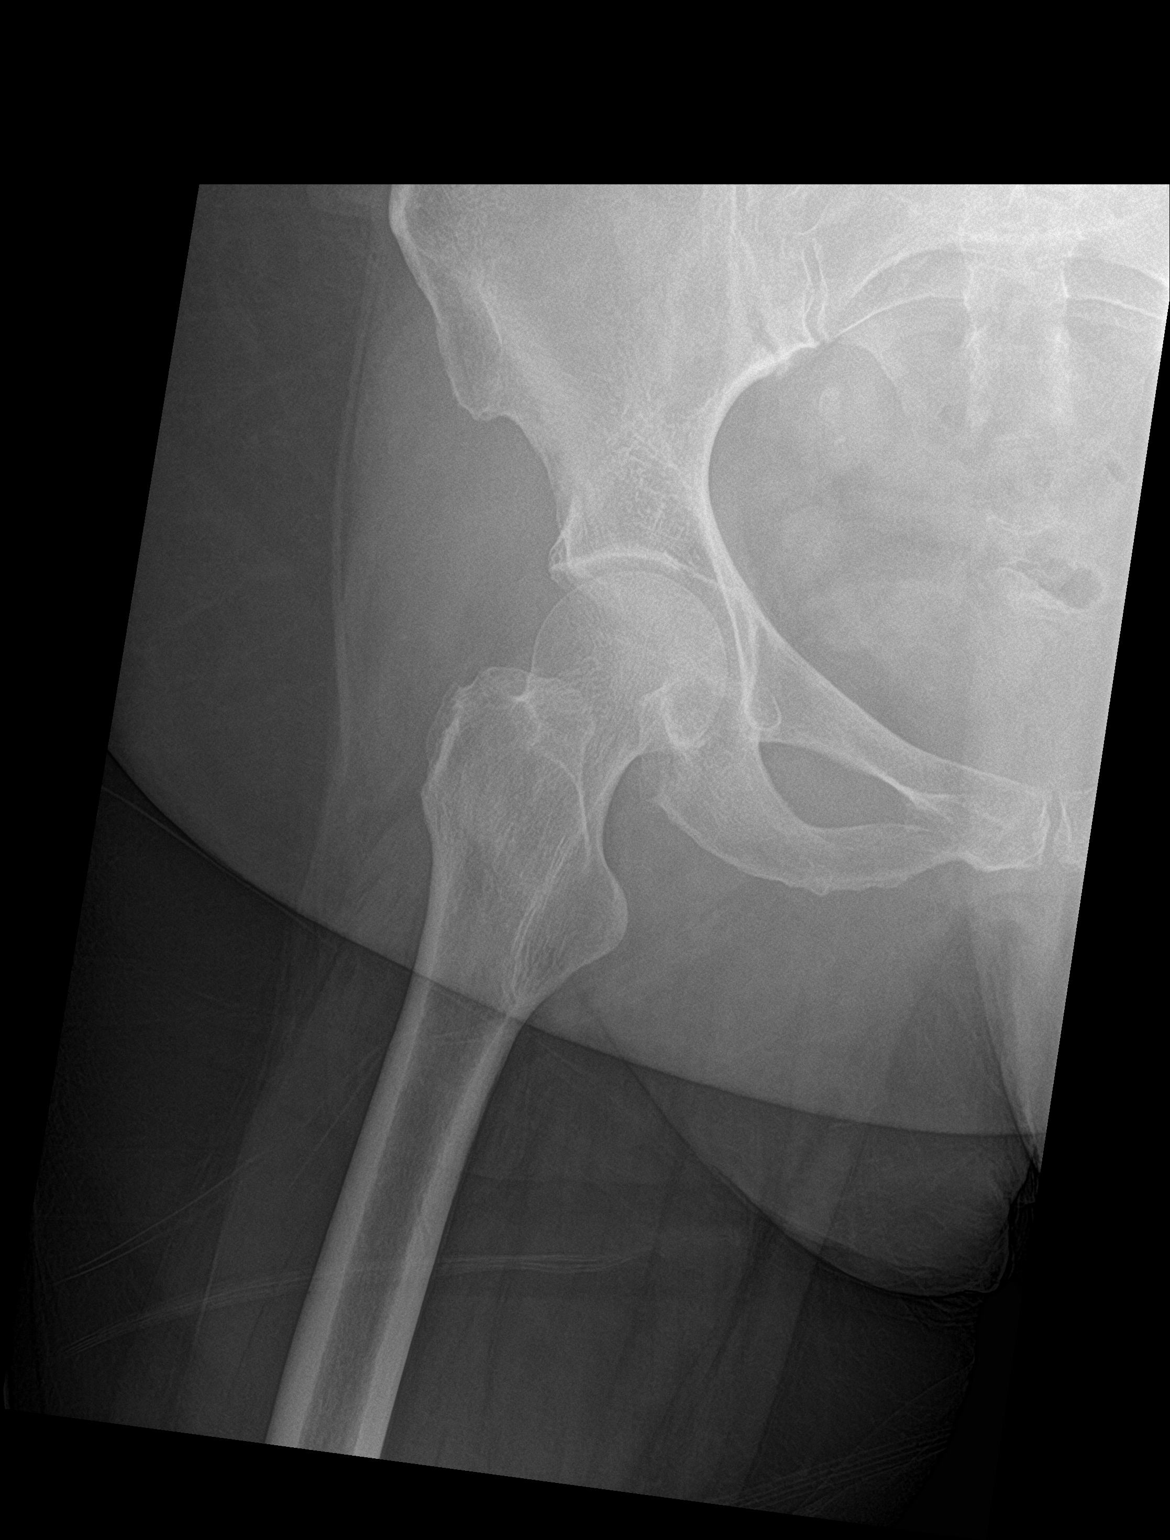

[3 of 3 positions shown; findings below may reference images not displayed]

FINDINGS: Single surgical clip over the lateral left pelvis. Bony structures
and joint spaces involving the hips are within normal and symmetric.
No acute fracture or dislocation.
IMPRESSION: Negative.

## 2023-07-29 NOTE — Progress Notes (Unsigned)
Cardiology Office Note   Date:  08/01/2023   ID:  Meghan Simmons, DOB 1966/01/02, MRN [id number]  PCP:  Benetta Spar, MD  Cardiologist:   Dietrich Pates, MD   Pt presents for f/u of HTN      History of Present Illness: Meghan Simmons is a 57 y.o. female with a history of  HTN and a large secundum ASD  Echo in 2015 LVEF normal  RV dilated    ASD medium sized with L to R flow  PAP 46   TEE also done showing large ASD with RV dilation/dysfunction.   Suspicion for small VSD I saw her last in 2016 Cardiac CT done that showed CAD:  LAD mild; LCx small  Mod plaque, RCA with possible significant stenosis   Large ASD (27 x 29 mm) with sufficient rim on all edges except anterior.   Normal PV drainage.  No VSD noted  RV and PA enlargement noted    The pt did not come back to clinic until 2022   She ultimately was referred to  [facility] for ASD repair in Jan 2023 Note: R/ L heart cath :LM had  unusual take off with dampening of catheter on each engagement. No significant disease  Felt  Possible spasm  T ASD closure on Feb19, 2023  ([place])    I saw the pt in March 2024   Since seen she denies CP   Notes occasional sensation like she has to catch breath Pt busy, getting ready to move     No palpitations  Stopped ASA prior to teeth extraction  Has not resumed    Pt still smoking   6 cigs per day    Br:   may skip  If eats pop tart or bowl of frosted flakes or eggs with bread with mayo   Coffee with man made cream and sugar Lunch  Sandwich or burger   ham or tunafish or SPAM Dinner  Chicken Some veggies     Current Meds  Medication Sig   aspirin EC 81 MG tablet Take 1 tablet (81 mg total) by mouth daily. Swallow whole.   atorvastatin (LIPITOR) 80 MG tablet Take 1 tablet (80 mg total) by mouth daily.   ciclopirox (PENLAC) 8 % solution Apply topically at bedtime. Apply over nail and surrounding skin. Apply daily over previous coat. After seven (7) days, may remove with alcohol and continue  cycle.   citalopram (CELEXA) 20 MG tablet Take 20 mg by mouth daily.   cyclobenzaprine (FLEXERIL) 10 MG tablet Take 1 tablet (10 mg total) by mouth 2 (two) times daily as needed.   losartan (COZAAR) 100 MG tablet Take 1 tablet (100 mg total) by mouth daily.   predniSONE (STERAPRED UNI-PAK 21 TAB) 10 MG (21) TBPK tablet 10 mg DS 12 as directed     Allergies:   Amoxicillin, Zetia [ezetimibe], and Erythromycin   Past Medical History:  Diagnosis Date   Atrial septal defect    Heart murmur    Hypertension     Past Surgical History:  Procedure Laterality Date   CHOLECYSTECTOMY     TEE WITHOUT CARDIOVERSION N/A 11/27/2014   Procedure: TRANSESOPHAGEAL ECHOCARDIOGRAM (TEE);  Surgeon: Pricilla Riffle, MD;  Location: [facility] ENDOSCOPY;  Service: Cardiovascular;  Laterality: N/A;     Social History:  The patient  reports that she has been smoking cigarettes. She started smoking about 16 years ago. She has a 12.6 pack-year smoking history. She has  never used smokeless tobacco. She reports that she does not currently use alcohol. She reports that she does not currently use drugs after having used the following drugs: Marijuana and "Crack" cocaine.   Family History:  The patient's family history includes Depression in her mother; Diabetes in her mother; Heart murmur in her mother; Hypertension in her mother; Kidney failure (age of onset: 40) in her father.    ROS:  Please see the history of present illness. All other systems are reviewed and  Negative to the above problem except as noted.    PHYSICAL EXAM: VS:  BP 118/68   Pulse 65   Ht 5\' 3"  (1.6 m)   Wt 180 lb 6.4 oz (81.8 kg)   SpO2 98%   BMI 31.96 kg/m   GEN: Obese  57 yo in no acute distress  HEENT: normal  Neck: no JVD,no carotid bruits Cardiac: RRR; no murmur  No LE  edema  Respiratory:  clear to auscultation  GI: soft, nontender,  No hepatomegaly    EKG:  EKG not done  Echo   12/13/21  1. Post surgical repair ASD, [facility] 10/06/21. No  residual shunt. Left ventricular ejection fraction, by estimation, is 60 to 65%. The left ventricle has normal function. The left ventricle has no regional wall motion abnormalities. There is mild left ventricular hypertrophy of the basal-septal segment. Left ventricular diastolic parameters are consistent with Grade I diastolic dysfunction (impaired relaxation). 2. Right ventricular systolic function is normal. The right ventricular size is normal. There is normal pulmonary artery systolic pressure. 3. 4. Post surgical repair ASD, [facility] 10/06/21. No residual shunt. The mitral valve is normal in structure. No evidence of mitral valve regurgitation. No evidence of mitral stenosis. 5. The aortic valve is normal in structure. Aortic valve regurgitation is not visualized. No aortic stenosis is present. 6. The inferior vena cava is normal in size with greater than 50% respiratory variability, suggesting right atrial pressure of 3 mmHg.  Cardiac Cath Jan 2023 ([facility])  Impressions:  Significant shunting predominantly right to left. Unusual LM anatomy with dampening of catheter on each engagement of LM. Borderline LCx and RCA disease as described in CT report. Elected to proceed with iFR of LCx. Unable to cannulate from radial approach. From femoral approach required JL3.5 guide.   Initially the LCx iFR was 0.83 but after further advancement of wire well past lesion, multiple assessments with guide clearly disengaged from LM showed iFR of 0.97 to 1.0.    Recommendations:  Significant predominanlty left to right shunt consistent with ASD. Likely non-significant epicardial coronary disease, with ventricularization and dampening of pressure with engagement of LM which does not appear significant by iFR.  The LM has an unusual take off and the hemodynamic significance of this is unclear and may be altered by the guide and a wire being in place.  By invasive assessment this was not significant.There  may also be some component of spasm as values normalized over time, or the hemodynamic consequences may be altered by the presence of the wire and or guide, straightening out the lesion.  CT FFR might useful or adenosine MRI to assess for ischemia non-invasively. Procedure Note  Povsic, Shelah Lewandowsky, MD - 10/05/2021 Formatting of this note might be different from the original. Impressions:  Significant shunting predominantly right to left. Unusual LM anatomy with dampening of catheter on each engagement of LM. Borderline LCx and RCA disease as described in CT report. Elected to proceed with iFR of LCx.  Unable to cannulate from radial approach. From femoral approach required JL3.5 guide.   Initially the LCx iFR was 0.83 but after further advancement of wire well past lesion, multiple assessments with guide clearly disengaged from LM showed iFR of 0.97 to 1.0.    Recommendations:  Significant predominanlty left to right shunt consistent with ASD. Likely non-significant epicardial coronary disease, with ventricularization and dampening of pressure with engagement of LM which does not appear significant by iFR.  The LM has an unusual take off and the hemodynamic significance of this is unclear and may be altered by the guide and a wire being in place.  By invasive assessment this was not significant.There may also be some component of spasm as values normalized over time, or the hemodynamic consequences may be altered by the presence of the wire and or guide, straightening out the lesion.  CT FFR might useful or adenosine MRI to assess for ischemia non-invasively. Lipid Panel    Component Value Date/Time   CHOL 189 02/22/2023 0759   TRIG 84 05/11/2023 0813   HDL 54 05/11/2023 0813   CHOLHDL 4.2 02/22/2023 0759   VLDL 18 02/22/2023 0759   LDLCALC 126 (H) 02/22/2023 0759      Wt Readings from Last 3 Encounters:  08/01/23 180 lb 6.4 oz (81.8 kg)  07/13/23 174 lb 6 oz (79.1 kg)  02/23/23  164 lb (74.4 kg)      ASSESSMENT AND PLAN:    ASD   Pt is s/p secundum ASD repair in Jan 2023 at [facility], she continues to do well  Echo in March 2023 shows normal LV and RV function  I recomm that she resume 81 mg ASA     2   HTN  BP is controlled  Keep on  losartan    3  CAD   Pt with mild calcified plaques noted on CT scan in 2016 Rx risk factors   4  HL On atorvastatin Review of pharmacy note, question if compliant   Allergic rxn may have been to antibiotic and not Zetia Overall, I question compliance, understanding    With Leqvio compliance would be documented    Will check on coverage    5  Tob  She continues to smoke about 6 cigs per day  Counselled again on cessatoin       6 Diet  REviewed  Cut out processed foods   Cut out sweet tea  Increase walking   Plan for F/U at end of summer        Current medicines are reviewed at length with the patient today.  The patient does not have concerns regarding medicines.  Signed, Dietrich Pates, MD  08/01/2023 12:18 PM    [facility] [address] [place], [place], [place]  [address] Phone: [phone]; Fax: [phone]

## 2023-08-01 ENCOUNTER — Encounter: Payer: Self-pay | Admitting: Internal Medicine

## 2023-08-01 ENCOUNTER — Ambulatory Visit: Payer: Medicaid Other | Attending: Internal Medicine | Admitting: Internal Medicine

## 2023-08-01 ENCOUNTER — Ambulatory Visit: Payer: Medicaid Other

## 2023-08-01 VITALS — BP 118/68 | HR 65 | Ht 63.0 in | Wt 180.4 lb

## 2023-08-01 DIAGNOSIS — E785 Hyperlipidemia, unspecified: Secondary | ICD-10-CM

## 2023-08-01 DIAGNOSIS — E782 Mixed hyperlipidemia: Secondary | ICD-10-CM

## 2023-08-01 NOTE — Patient Instructions (Signed)
Medication Instructions:  Your physician has recommended you make the following change in your medication:   Restart Aspirin 81 mg Daily   *If you need a refill on your cardiac medications before your next appointment, please call your pharmacy*   Lab Work: NONE   If you have labs (blood work) drawn today and your tests are completely normal, you will receive your results only by: MyChart Message (if you have MyChart) OR A paper copy in the mail If you have any lab test that is abnormal or we need to change your treatment, we will call you to review the results.   Testing/Procedures: NONE    Follow-Up: At [facility], you and your health needs are our priority.  As part of our continuing mission to provide you with exceptional heart care, we have created designated Provider Care Teams.  These Care Teams include your primary Cardiologist (physician) and Advanced Practice Providers (APPs -  Physician Assistants and Nurse Practitioners) who all work together to provide you with the care you need, when you need it.  We recommend signing up for the patient portal called "MyChart".  Sign up information is provided on this After Visit Summary.  MyChart is used to connect with patients for Virtual Visits (Telemedicine).  Patients are able to view lab/test results, encounter notes, upcoming appointments, etc.  Non-urgent messages can be sent to your provider as well.   To learn more about what you can do with MyChart, go to [email or web address].    Your next appointment:    August / September   Provider:   You may see Dietrich Pates, MD or one of the following Advanced Practice Providers on your designated Care Team:   Randall An, PA-C  Jacolyn Reedy, PA-C     Other Instructions Thank you for choosing [facility]!

## 2023-08-29 ENCOUNTER — Telehealth: Payer: Self-pay | Admitting: Internal Medicine

## 2023-08-29 NOTE — Telephone Encounter (Signed)
error 

## 2023-09-21 ENCOUNTER — Other Ambulatory Visit (HOSPITAL_COMMUNITY): Payer: Self-pay | Admitting: Internal Medicine

## 2023-09-21 DIAGNOSIS — Z1231 Encounter for screening mammogram for malignant neoplasm of breast: Secondary | ICD-10-CM

## 2023-09-26 ENCOUNTER — Ambulatory Visit (INDEPENDENT_AMBULATORY_CARE_PROVIDER_SITE_OTHER): Payer: Medicaid Other | Admitting: Dermatology

## 2023-09-26 ENCOUNTER — Encounter: Payer: Self-pay | Admitting: Dermatology

## 2023-09-26 ENCOUNTER — Telehealth: Payer: Self-pay

## 2023-09-26 DIAGNOSIS — Z5181 Encounter for therapeutic drug level monitoring: Secondary | ICD-10-CM | POA: Diagnosis not present

## 2023-09-26 DIAGNOSIS — L409 Psoriasis, unspecified: Secondary | ICD-10-CM | POA: Diagnosis not present

## 2023-09-26 DIAGNOSIS — Z7189 Other specified counseling: Secondary | ICD-10-CM

## 2023-09-26 DIAGNOSIS — Z79899 Other long term (current) drug therapy: Secondary | ICD-10-CM | POA: Diagnosis not present

## 2023-09-26 DIAGNOSIS — L4050 Arthropathic psoriasis, unspecified: Secondary | ICD-10-CM | POA: Diagnosis not present

## 2023-09-26 NOTE — Addendum Note (Signed)
 Addended by: Elie Goody on: 09/26/2023 03:49 PM   Modules accepted: Orders

## 2023-09-26 NOTE — Progress Notes (Addendum)
   New Patient Visit   Subjective  Meghan Simmons is a 58 y.o. female who presents for the following: Psoriasis since 2010, feet, legs, trunk, arms, scalp,  active areas elbows, knees, L lower leg, back, itchy, in past used HC 2.5% cr, TMC 0.1% cr, TMC 0.5% cr, Hydroxyzine , photos form hospital encounter 05/16/22 in media and from [place] PA-C 04/12/22, no hx of Inflammatory bowl disease The patient has spots, moles and lesions to be evaluated, some may be new or changing and the patient may have concern these could be cancer.  New patient referral from Dr. Tesfaye Demissie Fanta.  The following portions of the chart were reviewed this encounter and updated as appropriate: medications, allergies, medical history  Review of Systems:  No other skin or systemic complaints except as noted in HPI or Assessment and Plan.  Objective  Well appearing patient in no apparent distress; mood and affect are within normal limits.   A focused examination was performed of the following areas: a  Relevant exam findings are noted in the Assessment and Plan.    Assessment & Plan   PSORIASIS with PSORIATIC ARTHRITIS Elbows, knees, legs, back, abdomen Exam: Well-demarcated erythematous papules/plaques with silvery scale, guttate pink scaly papules elbows, knees, lower legs abdomen umbilicus 5% BSA.  Chronic and persistent condition with duration or expected duration over one year. Condition is bothersome/symptomatic for patient. Currently flared.  patient complains of joint pains fingers, elbows, knees  Psoriasis is a chronic non-curable, but treatable genetic/hereditary disease that may have other systemic features affecting other organ systems such as joints (Psoriatic Arthritis). It is associated with an increased risk of inflammatory bowel disease, heart disease, non-alcoholic fatty liver disease, and depression.  Treatments include light and laser treatments; topical medications; and  systemic medications including oral and injectables.  Treatment Plan: Pending labs will start patient on samples of Cosentyx  300 mg weekly x 5 weeks then monthly and will send prescription to [facility]  PSORIASIS   Related Procedures QuantiFERON-TB Gold Plus LONG-TERM USE OF HIGH-RISK MEDICATION   Related Procedures Hepatitis B surface antibody,qualitative Hepatitis B surface antibody,quantitative Hepatitis B surface antigen Hepatitis B core antibody, total Hepatitis C antibody HIV Antibody (routine testing w rflx) CBC with Differential/Platelets CMP COUNSELING AND COORDINATION OF CARE   PSA (PSORIATIC ARTHRITIS) (HCC)    Return for to be scheduled with nurse for Cosentyx  start (samples) then 7 wks for f/u with Dr. Claudene Psoriasis.  I, Grayce Saunas, RMA, am acting as scribe for Boneta Claudene, MD .   Documentation: I have reviewed the above documentation for accuracy and completeness, and I agree with the above.  Boneta Claudene, MD

## 2023-09-26 NOTE — Telephone Encounter (Signed)
 Spoke to patient and advised we did need some more labs before she could start Cosentyx .  Advised pt I would mail her the lab requisition and once we get the results we can schedule her to start sample Cosentyx .  Per patient request mailed requisition to [address] [place] [address]./sh

## 2023-09-26 NOTE — Patient Instructions (Signed)

## 2023-09-27 ENCOUNTER — Encounter: Payer: Self-pay | Admitting: *Deleted

## 2023-09-28 ENCOUNTER — Telehealth: Payer: Self-pay | Admitting: Pharmacist

## 2023-09-28 LAB — QUANTIFERON-TB GOLD PLUS
QuantiFERON Mitogen Value: >10 [IU]/mL
QuantiFERON Nil Value: 0.02 [IU]/mL
QuantiFERON TB1 Ag Value: 0.02 [IU]/mL
QuantiFERON TB2 Ag Value: 0.02 [IU]/mL
QuantiFERON-TB Gold Plus: NEGATIVE

## 2023-09-28 NOTE — Telephone Encounter (Signed)
 Patient called and left a VM for Megan. Returned patient call. No answer. LVM

## 2023-10-03 ENCOUNTER — Ambulatory Visit (HOSPITAL_COMMUNITY)
Admission: RE | Admit: 2023-10-03 | Discharge: 2023-10-03 | Disposition: A | Discharge status: Home / Self Care | Payer: Medicaid Other | Source: Ambulatory Visit | Attending: Internal Medicine | Admitting: Internal Medicine

## 2023-10-03 ENCOUNTER — Encounter (HOSPITAL_COMMUNITY): Payer: Self-pay

## 2023-10-03 DIAGNOSIS — Z1231 Encounter for screening mammogram for malignant neoplasm of breast: Secondary | ICD-10-CM | POA: Diagnosis present

## 2023-10-04 ENCOUNTER — Telehealth: Payer: Self-pay | Admitting: Internal Medicine

## 2023-10-04 ENCOUNTER — Other Ambulatory Visit (HOSPITAL_COMMUNITY)
Admission: RE | Admit: 2023-10-04 | Discharge: 2023-10-04 | Disposition: A | Discharge status: Home / Self Care | Payer: Medicaid Other | Source: Ambulatory Visit | Attending: Internal Medicine | Admitting: Internal Medicine

## 2023-10-04 ENCOUNTER — Other Ambulatory Visit (HOSPITAL_COMMUNITY)
Admission: RE | Admit: 2023-10-04 | Discharge: 2023-10-04 | Disposition: A | Discharge status: Home / Self Care | Payer: Medicaid Other | Source: Ambulatory Visit | Attending: Family Medicine | Admitting: Family Medicine

## 2023-10-04 DIAGNOSIS — Z79899 Other long term (current) drug therapy: Secondary | ICD-10-CM | POA: Insufficient documentation

## 2023-10-04 DIAGNOSIS — E782 Mixed hyperlipidemia: Secondary | ICD-10-CM | POA: Diagnosis not present

## 2023-10-04 LAB — LIPID PANEL
Cholesterol: 177 mg/dL (ref 0–200)
HDL: 56 mg/dL (ref >40)
LDL Cholesterol: 107 mg/dL — ABNORMAL HIGH (ref 0–99)
Total CHOL/HDL Ratio: 3.2 {ratio}
Triglycerides: 72 mg/dL (ref <150)
VLDL: 14 mg/dL (ref 0–40)

## 2023-10-04 LAB — HEPATITIS B SURFACE ANTIBODY,QUALITATIVE: Hep B S Ab: NONREACTIVE

## 2023-10-04 LAB — HEPATITIS B SURFACE ANTIGEN: Hepatitis B Surface Ag: NONREACTIVE

## 2023-10-04 LAB — HEPATITIS B CORE ANTIBODY, TOTAL: Hep B Core Total Ab: NONREACTIVE

## 2023-10-04 LAB — HEPATITIS C ANTIBODY: HCV Ab: NONREACTIVE

## 2023-10-04 LAB — HIV ANTIBODY (ROUTINE TESTING W REFLEX): HIV Screen 4th Generation wRfx: NONREACTIVE

## 2023-10-04 NOTE — Telephone Encounter (Signed)
Per pharmd note, pt needed lab work,needs to be done, Britta Mccreedy in lab notified

## 2023-10-04 NOTE — Telephone Encounter (Signed)
Patient is at [facility] outpatient lab and wants to know if she should still have her lipid lab work order on 07/27/23 done.

## 2023-10-08 ENCOUNTER — Other Ambulatory Visit: Payer: Self-pay

## 2023-10-08 DIAGNOSIS — E782 Mixed hyperlipidemia: Secondary | ICD-10-CM

## 2023-10-08 DIAGNOSIS — E785 Hyperlipidemia, unspecified: Secondary | ICD-10-CM

## 2023-10-08 DIAGNOSIS — Z79899 Other long term (current) drug therapy: Secondary | ICD-10-CM

## 2023-10-08 MED ORDER — EZETIMIBE 10 MG PO TABS: 10.0000 mg | ORAL_TABLET | Freq: Every day | ORAL | 3 refills | Status: DC

## 2023-10-09 ENCOUNTER — Other Ambulatory Visit: Payer: Self-pay | Admitting: Dermatology

## 2023-10-09 ENCOUNTER — Encounter: Payer: Self-pay | Admitting: Dermatology

## 2023-10-09 DIAGNOSIS — Z79899 Other long term (current) drug therapy: Secondary | ICD-10-CM

## 2023-10-09 DIAGNOSIS — L409 Psoriasis, unspecified: Secondary | ICD-10-CM

## 2023-10-09 DIAGNOSIS — L4050 Arthropathic psoriasis, unspecified: Secondary | ICD-10-CM

## 2023-10-09 MED ORDER — COSENTYX SENSOREADY (300 MG) 150 MG/ML ~~LOC~~ SOAJ: 300.0000 mg | SUBCUTANEOUS | 0 refills | Status: AC

## 2023-10-09 MED ORDER — COSENTYX SENSOREADY (300 MG) 150 MG/ML ~~LOC~~ SOAJ: 300.0000 mg | SUBCUTANEOUS | 10 refills | Status: AC

## 2023-10-25 ENCOUNTER — Encounter: Payer: Self-pay | Admitting: Adult Health

## 2023-10-25 ENCOUNTER — Ambulatory Visit: Payer: Medicaid Other | Admitting: Adult Health

## 2023-10-25 ENCOUNTER — Other Ambulatory Visit (HOSPITAL_COMMUNITY)
Admission: RE | Admit: 2023-10-25 | Discharge: 2023-10-25 | Disposition: A | Discharge status: Home / Self Care | Payer: Medicaid Other | Source: Ambulatory Visit | Attending: Adult Health | Admitting: Adult Health

## 2023-10-25 VITALS — BP 168/111 | HR 67 | Ht 63.5 in | Wt 192.5 lb

## 2023-10-25 DIAGNOSIS — Z1211 Encounter for screening for malignant neoplasm of colon: Secondary | ICD-10-CM | POA: Diagnosis not present

## 2023-10-25 DIAGNOSIS — I10 Essential (primary) hypertension: Secondary | ICD-10-CM

## 2023-10-25 DIAGNOSIS — Z0000 Encounter for general adult medical examination without abnormal findings: Secondary | ICD-10-CM | POA: Diagnosis present

## 2023-10-25 DIAGNOSIS — Z1331 Encounter for screening for depression: Secondary | ICD-10-CM | POA: Diagnosis not present

## 2023-10-25 DIAGNOSIS — Z01419 Encounter for gynecological examination (general) (routine) without abnormal findings: Secondary | ICD-10-CM | POA: Insufficient documentation

## 2023-10-25 LAB — HEMOCCULT GUIAC POC 1CARD (OFFICE): Fecal Occult Blood, POC: NEGATIVE

## 2023-10-25 NOTE — Progress Notes (Signed)
 Patient ID: CELESTER MORGAN, female   DOB: 02-14-1966, 58 y.o.   MRN: [id number] History of Present Illness: Diksha is a 58 year old white female, single, PM in for a well woman GYN exam and pap. She was referred by Dr Carlette.  PCP is Dr Carlette.   Current Medications, Allergies, Past Medical History, Past Surgical History, Family History and Social History were reviewed in [employer] record.     Review of Systems: Patient denies any headaches, hearing loss, fatigue, blurred vision, shortness of breath, chest pain, abdominal pain, problems with bowel movements, urination, or intercourse. No joint pain or mood swings.  Denies any vaginal bleeding Has pain left side at ribs with coughing on and off since GB removed in 2000-2001.    Physical Exam:BP (!) 168/111 (BP Location: Right Arm, Patient Position: Sitting, Cuff Size: Normal)   Pulse 67   Ht 5' 3.5 (1.613 m)   Wt 192 lb 8 oz (87.3 kg)   LMP  (LMP Unknown)   BMI 33.56 kg/m   she forgot BP meds last night she says  General:  Well developed, well nourished, no acute distress Skin:  Warm and dry Neck:  Midline trachea, normal thyroid, good ROM, no lymphadenopathy Lungs; Clear to auscultation bilaterally Breast:  No dominant palpable mass, retraction, or nipple discharge, has some tenderness left rib line Cardiovascular: Regular rate and rhythm Abdomen:  Soft, non tender, no hepatosplenomegaly Pelvic:  External genitalia is normal in appearance, no lesions.  The vagina is pale Urethra has no lesions or masses. The cervix is smooth, pap with GC/CHL and HR HPV genotyping performed.   Uterus is felt to be normal size, shape, and contour.  No adnexal masses or tenderness noted.Bladder is non tender, no masses felt. Rectal: Good sphincter tone, no polyps, or hemorrhoids felt.  Hemoccult negative. Extremities/musculoskeletal:  No swelling or varicosities noted, no clubbing or cyanosis Psych:  No mood changes, alert and  cooperative,seems happy AA is 0 Fall risk is low    10/25/2023    9:35 AM  Depression screen PHQ 2/9  Decreased Interest 1  Down, Depressed, Hopeless 1  PHQ - 2 Score 2  Altered sleeping 1  Tired, decreased energy 2  Change in appetite 2  Feeling bad or failure about yourself  2  Trouble concentrating 2  Moving slowly or fidgety/restless 1  Suicidal thoughts 2  PHQ-9 Score 14   On celexa      10/25/2023    9:39 AM  GAD 7 : Generalized Anxiety Score  Nervous, Anxious, on Edge 1  Control/stop worrying 1  Worry too much - different things 2  Trouble relaxing 1  Restless 1  Easily annoyed or irritable 1  Afraid - awful might happen 1  Total GAD 7 Score 8    Upstream - 10/25/23 0949       Pregnancy Intention Screening   Does the patient want to become pregnant in the next year? N/A    Does the patient's partner want to become pregnant in the next year? N/A    Would the patient like to discuss contraceptive options today? N/A      Contraception Wrap Up   Current Method No Method - Other Reason   PM   Reason for No Current Contraceptive Method at Intake ([facility] Only) Other    End Method No Method - Other Reason   PM   Contraception Counseling Provided No  Examination chaperoned by Clarita Salt LPN  Impression and plan: 1. Routine general medical examination at a health care facility (Primary) Pap sent Pap in 3 years if normal Physical with PCP Labs with PCP Mammogram was negative 10/03/23 Get colonoscopy she says Dr Carlette referred - Cytology - PAP( [place])  2. Encounter for routine gynecological examination with Papanicolaou smear of cervix Pap sent  3. Primary hypertension Take BP meds every day and follow up with Dr Carlette  4. Encounter for screening fecal occult blood testing Hemoccult was negative - POCT occult blood stool

## 2023-11-02 LAB — CYTOLOGY - PAP
Chlamydia: NEGATIVE
Comment: NEGATIVE
Comment: NEGATIVE
Comment: NEGATIVE
Comment: NEGATIVE
Comment: NORMAL
HPV 16: NEGATIVE
HPV 18 / 45: NEGATIVE
High risk HPV: POSITIVE — AB
Neisseria Gonorrhea: NEGATIVE

## 2023-11-06 ENCOUNTER — Encounter: Payer: Self-pay | Admitting: Adult Health

## 2023-11-06 DIAGNOSIS — R87612 Low grade squamous intraepithelial lesion on cytologic smear of cervix (LGSIL): Secondary | ICD-10-CM | POA: Insufficient documentation

## 2023-11-12 ENCOUNTER — Telehealth: Payer: Self-pay

## 2023-11-12 NOTE — Telephone Encounter (Signed)
 Left message on voicemail about calling the office to get a colpo scheduled per Cyril Mourning.

## 2023-11-15 ENCOUNTER — Ambulatory Visit (INDEPENDENT_AMBULATORY_CARE_PROVIDER_SITE_OTHER): Payer: Medicaid Other | Admitting: Dermatology

## 2023-11-15 ENCOUNTER — Encounter: Payer: Self-pay | Admitting: Dermatology

## 2023-11-15 DIAGNOSIS — L4050 Arthropathic psoriasis, unspecified: Secondary | ICD-10-CM

## 2023-11-15 DIAGNOSIS — Z79899 Other long term (current) drug therapy: Secondary | ICD-10-CM

## 2023-11-15 DIAGNOSIS — L409 Psoriasis, unspecified: Secondary | ICD-10-CM

## 2023-11-15 DIAGNOSIS — Z7189 Other specified counseling: Secondary | ICD-10-CM

## 2023-11-15 MED ORDER — SECUKINUMAB (300 MG DOSE) 150 MG/ML ~~LOC~~ SOAJ
300.0000 mg | SUBCUTANEOUS | Status: AC
  Administered 2023-11-15 – 2023-12-06 (×2): 300 mg via SUBCUTANEOUS

## 2023-11-15 NOTE — Progress Notes (Signed)
 Follow-Up Visit   Subjective  Meghan Simmons is a 58 y.o. female who presents for the following: 2 month Psoriasis follow-up of the elbows, knees, abdomen, back. Areas are itchy and burns, moisturizer helps some. She is not currently treating psoriasis with prescription medicines. Cosentyx appeal was denied. She has used HC 2.5%, hydroxyzine, TMC 0.1% and 0.5% in the past.     The following portions of the chart were reviewed this encounter and updated as appropriate: medications, allergies, medical history  Review of Systems:  No other skin or systemic complaints except as noted in HPI or Assessment and Plan.  Objective  Well appearing patient in no apparent distress; mood and affect are within normal limits.  Areas Examined: Face, arms, legs, abdomen  Relevant exam findings are noted in the Assessment and Plan.   Assessment & Plan   PSORIASIS   Related Medications Secukinumab, 300 MG Dose, (COSENTYX SENSOREADY, 300 MG,) 150 MG/ML SOAJ Inject 2 mLs (300 mg total) into the skin as directed. On week 0, 1, 2, 3 and 4. Secukinumab, 300 MG Dose, (COSENTYX SENSOREADY, 300 MG,) 150 MG/ML SOAJ Inject 2 mLs (300 mg total) into the skin every 28 (twenty-eight) days. For maintenance. Secukinumab (300 MG Dose) SOAJ 300 mg  LONG-TERM USE OF HIGH-RISK MEDICATION   Related Medications Secukinumab, 300 MG Dose, (COSENTYX SENSOREADY, 300 MG,) 150 MG/ML SOAJ Inject 2 mLs (300 mg total) into the skin as directed. On week 0, 1, 2, 3 and 4. Secukinumab, 300 MG Dose, (COSENTYX SENSOREADY, 300 MG,) 150 MG/ML SOAJ Inject 2 mLs (300 mg total) into the skin every 28 (twenty-eight) days. For maintenance. COUNSELING AND COORDINATION OF CARE   PSA (PSORIATIC ARTHRITIS) (HCC)   Related Medications Secukinumab, 300 MG Dose, (COSENTYX SENSOREADY, 300 MG,) 150 MG/ML SOAJ Inject 2 mLs (300 mg total) into the skin as directed. On week 0, 1, 2, 3 and 4. Secukinumab, 300 MG Dose, (COSENTYX SENSOREADY,  300 MG,) 150 MG/ML SOAJ Inject 2 mLs (300 mg total) into the skin every 28 (twenty-eight) days. For maintenance.  PSORIASIS with PSORIATIC ARTHRITIS Well-demarcated erythematous papules/plaques with silvery scale, guttate pink scaly papules. Elbows, knees, postauricular sulci, umbilicus. 5% BSA with past episodes of erythroderma (2024, 2023, and ~5 years ago per patient report). Postinflammatory erythema scattered on legs from recent lesions.  Chronic and persistent condition with duration or expected duration over one year. Condition is bothersome/symptomatic for patient. Currently flared.  Reasons to avoid methotrexate: history of smoking makes the pulmonary fibrosis risk of methotrexate unacceptable. Smoking, hypertension, congestive heart failure, obesity, hyperlipidemia, and long-standing presence of psoriasis raise risk of cardiovascular disease and metabolic disease (cerebrovascular accident, myocardial infarction, death), which can be worsened by methotrexate. History of sepsis in 2017 means the immunosuppression of methotrexate has a higher risk of causing infection and death than a biologic like Cosentyx. Congestive heart failure makes TNF inhibitors such as Humira contraindicated.  Patient with joint pain in fingers, elbows, knees.  Treatment Plan: Labs from 10/04/2023 wnl. Will resubmit approval for Cosentyx injections.  Sample of Cosentyx 300 mg/2 mL injected into the left upper arm today. Patient tolerated well. Will continue with samples until Cosentyx approved with insurance (Next injections at week 1, 2, 3, 4. Then inject q 4 weeks) [facility] [id number] Lot [id number] Exp 06/17/2025   Reviewed risks of biologics including immunosuppression, infections, injection site reaction, and failure to improve condition. Goal is control of skin condition, not cure.  Some older biologics such as Humira and Enbrel  may slightly increase risk of malignancy and may worsen congestive heart failure.  Taltz  and Cosentyx may cause inflammatory bowel disease to flare. The use of biologics requires long term medication management, including periodic office visits and monitoring of blood work.   Counseling on psoriasis and coordination of care  psoriasis is a chronic non-curable, but treatable genetic/hereditary disease that may have other systemic features affecting other organ systems such as joints (Psoriatic Arthritis). It is associated with an increased risk of inflammatory bowel disease, heart disease, non-alcoholic fatty liver disease, and depression.  Treatments include light and laser treatments; topical medications; and systemic medications including oral and injectables.   Return in about 1 week (around 11/22/2023) for with nurse for Cosentyx injection (weeks 1, 2, 3, 4). F/U w/Dr Katrinka Blazing 2 months.  [name], CMA, am acting as scribe for Elie Goody, MD .   Documentation: I have reviewed the above documentation for accuracy and completeness, and I agree with the above.  Elie Goody, MD

## 2023-11-15 NOTE — Patient Instructions (Addendum)
 Cosentyx (Secukinumab) Injection What is this medication? SECUKINUMAB (sek ue KIN ue mab) treats autoimmune conditions, such as arthritis and psoriasis. It works by slowing down an overactive immune system.  It may also be used to treat hidradenitis suppurativa (HS). HS is a condition that causes painful lumps under the skin in areas such as the armpits and groin. It is a monoclonal antibody. This medicine may be used for other purposes; ask your health care provider or pharmacist if you have questions. COMMON BRAND NAME(S): Cosentyx What should I tell my care team before I take this medication? They need to know if you have any of these conditions: Crohn's disease, ulcerative colitis, or other inflammatory bowel disease Immune system problems Infection or history of infection, such as a viral infection, chickenpox, cold sores, or herpes Recently received or are scheduled to receive a vaccine Tuberculosis, a positive skin test for tuberculosis, or recent close contact with someone who has tuberculosis An unusual or allergic reaction to secukinumab, latex, rubber, other medications, foods, dyes, or preservatives Pregnant or trying to get pregnant Breast-feeding How should I use this medication? This medication is injected into a vein or under the skin. It is given by your care team in a hospital or clinic setting if it is injected into a vein. If it is injected under the skin, it may be given at home. If you get this medication at home, you will be taught how to prepare and give it. Use it exactly as directed. Take it as directed on the prescription label. Keep taking it unless your care team tells you to stop. It is important that you put your used needles and syringes in a special sharps container. Do not put them in a trash can. If you do not have a sharps container, call your pharmacist or care team to get one. A special MedGuide will be given to you by the pharmacist with each prescription and  refill. Be sure to read this information carefully each time. Talk to your care team about the use of this medication in children. While it may be prescribed for children as young as 2 years for selected conditions, precautions do apply. Overdosage: If you think you have taken too much of this medicine contact a poison control center or emergency room at once. NOTE: This medicine is only for you. Do not share this medicine with others. What if I miss a dose? If you get this medication at the hospital or clinic: It is important not to miss your dose. Call your care team if you are unable to keep an appointment. If you give yourself this medication at home: If you miss a dose, take it as soon as you can. If it is almost time for your next dose, take only that dose. Do not take double or extra doses. Call your care team with questions. What may interact with this medication? Live virus vaccines This list may not describe all possible interactions. Give your health care provider a list of all the medicines, herbs, non-prescription drugs, or dietary supplements you use. Also tell them if you smoke, drink alcohol, or use illegal drugs. Some items may interact with your medicine. What should I watch for while using this medication? Visit your care team for regular checks on your progress. Tell your care team if your symptoms do not start to get better or if they get worse. You will be tested for tuberculosis (TB) before you start this medication. If your care team  prescribes any medication for TB, you should start taking the TB medication before starting this medication. Make sure to finish the full course of TB medication. This medication may increase your risk of getting an infection. Call your care team for advice if you get a fever, chills, sore throat, or other symptoms of a cold or flu. Do not treat yourself. Try to avoid being around people who are sick. This medication can decrease the response to a  vaccine. If you need to get vaccinated, tell your care team if you have received this medication within the last 6 months. Extra booster doses may be needed. Talk to your care team to see if a different vaccination schedule is needed. What side effects may I notice from receiving this medication? Side effects that you should report to your care team as soon as possible: Allergic reactions--skin rash, itching, hives, swelling of the face, lips, tongue, or throat Dry, itchy, scaly patches of skin that blister or peel Infection--fever, chills, cough, sore throat, wounds that don't heal, pain or trouble when passing urine, general feeling of discomfort or being unwell Sudden or severe stomach pain, bloody diarrhea, fever, nausea, vomiting Side effects that usually do not require medical attention (report these to your care team if they continue or are bothersome): Diarrhea Runny or stuffy nose Sore throat This list may not describe all possible side effects. Call your doctor for medical advice about side effects. You may report side effects to FDA at 1-800-FDA-1088. Where should I keep my medication? Keep out of the reach of children and pets. Store in the refrigerator. Do not freeze. Keep it in the original carton until you are ready to use it. Protect from light. Do not shake. Remove the dose from the refrigerator about 30 minutes before it is time for you to use it. Use it within 4 days of removing it from the carton. Get rid of any unused medication after the expiration date. To get rid of medications that are no longer needed or have expired: Take the medication to a medication take-back program. Check with your pharmacy or law enforcement to find a location. If you cannot return the medication, ask your pharmacist or care team how to get rid of this medication safely. NOTE: This sheet is a summary. It may not cover all possible information. If you have questions about this medicine, talk to your  doctor, pharmacist, or health care provider.  2024 Elsevier/Gold Standard (2023-08-17 00:00:00)  Reviewed risks of biologics including immunosuppression, infections, injection site reaction, and failure to improve condition. Goal is control of skin condition, not cure.  Some older biologics such as Humira and Enbrel may slightly increase risk of malignancy and may worsen congestive heart failure.  Taltz and Cosentyx may cause inflammatory bowel disease to flare. The use of biologics requires long term medication management, including periodic office visits and monitoring of blood work.   Due to recent changes in healthcare laws, you may see results of your pathology and/or laboratory studies on MyChart before the doctors have had a chance to review them. We understand that in some cases there may be results that are confusing or concerning to you. Please understand that not all results are received at the same time and often the doctors may need to interpret multiple results in order to provide you with the best plan of care or course of treatment. Therefore, we ask that you please give [place] 2 business days to thoroughly review all your results  before contacting the office for clarification. Should we see a critical lab result, you will be contacted sooner.   If You Need Anything After Your Visit  If you have any questions or concerns for your doctor, please call our main line at [phone] and press option 4 to reach your doctor's medical assistant. If no one answers, please leave a voicemail as directed and we will return your call as soon as possible. Messages left after 4 pm will be answered the following business day.   You may also send [place] a message via MyChart. We typically respond to MyChart messages within 1-2 business days.  For prescription refills, please ask your pharmacy to contact our office. Our fax number is [phone].  If you have an urgent issue when the clinic is closed that  cannot wait until the next business day, you can page your doctor at the number below.    Please note that while we do our best to be available for urgent issues outside of office hours, we are not available 24/7.   If you have an urgent issue and are unable to reach [place], you may choose to seek medical care at your doctor's office, retail clinic, urgent care center, or emergency room.  If you have a medical emergency, please immediately call 911 or go to the emergency department.  Pager Numbers  - Dr. Gwen Pounds: [phone]  - Dr. Roseanne Reno: [phone]  - Dr. Katrinka Blazing: [phone]   In the event of inclement weather, please call our main line at [phone] for an update on the status of any delays or closures.  Dermatology Medication Tips: Please keep the boxes that topical medications come in in order to help keep track of the instructions about where and how to use these. Pharmacies typically print the medication instructions only on the boxes and not directly on the medication tubes.   If your medication is too expensive, please contact our office at [phone] option 4 or send [place] a message through MyChart.   We are unable to tell what your co-pay for medications will be in advance as this is different depending on your insurance coverage. However, we may be able to find a substitute medication at lower cost or fill out paperwork to get insurance to cover a needed medication.   If a prior authorization is required to get your medication covered by your insurance company, please allow [place] 1-2 business days to complete this process.  Drug prices often vary depending on where the prescription is filled and some pharmacies may offer cheaper prices.  The website www.goodrx.com contains coupons for medications through different pharmacies. The prices here do not account for what the cost may be with help from insurance (it may be cheaper with your insurance), but the website can give you  the price if you did not use any insurance.  - You can print the associated coupon and take it with your prescription to the pharmacy.  - You may also stop by our office during regular business hours and pick up a GoodRx coupon card.  - If you need your prescription sent electronically to a different pharmacy, notify our office through [facility] or by phone at [phone] option 4.     Si Usted Necesita Algo Despus de Su Visita  Tambin puede enviarnos un mensaje a travs de Clinical cytogeneticist. Por lo general respondemos a los mensajes de MyChart en el transcurso de 1 a 2 das hbiles.  Para renovar recetas, por favor  pida a su farmacia que se ponga en contacto con nuestra oficina. Annie Sable de fax es [place] [phone].  Si tiene un asunto urgente cuando la clnica est cerrada y que no puede esperar hasta el siguiente da hbil, puede llamar/localizar a su doctor(a) al nmero que aparece a continuacin.   Por favor, tenga en cuenta que aunque hacemos todo lo posible para estar disponibles para asuntos urgentes fuera del horario de [place], no estamos disponibles las 24 horas del da, los 7 [address]  [address] de la [place].   Si tiene un problema urgente y no puede comunicarse con nosotros, puede optar por buscar atencin mdica  en el consultorio de su doctor(a), en una clnica privada, en un centro de atencin urgente o en una sala de emergencias.  Si tiene Engineer, drilling, por favor llame inmediatamente al 911 o vaya a la sala de emergencias.  Nmeros de bper  - Dr. Gwen Pounds: [phone]  - Dra. Roseanne Reno: [phone]  - Dr. Katrinka Blazing: [phone]   En caso de inclemencias del tiempo, por favor llame a Lacy Duverney principal al [phone] para una actualizacin sobre el [place] de cualquier retraso o cierre.  Consejos para la medicacin en dermatologa: Por favor, guarde las cajas en las que vienen los medicamentos de uso tpico para ayudarle a seguir las instrucciones sobre dnde y  cmo usarlos. Las farmacias generalmente imprimen las instrucciones del medicamento slo en las cajas y no directamente en los tubos del [place].   Si su medicamento es muy caro, por favor, pngase en contacto con Rolm Gala llamando al [phone] y presione la opcin 4 o envenos un mensaje a travs de Clinical cytogeneticist.   No podemos decirle cul ser su copago por los medicamentos por adelantado ya que esto es diferente dependiendo de la cobertura de su seguro. Sin embargo, es posible que podamos encontrar un medicamento sustituto a Audiological scientist un formulario para que el seguro cubra el medicamento que se considera necesario.   Si se requiere una autorizacin previa para que su compaa de seguros [place] su medicamento, por favor permtanos de 1 a 2 das hbiles para completar [address]  Los precios de los medicamentos varan con frecuencia dependiendo del Environmental consultant de dnde se surte la receta y alguna farmacias pueden ofrecer precios ms baratos.  El sitio web www.goodrx.com tiene cupones para medicamentos de Health and safety inspector. Los precios aqu no tienen en cuenta lo que podra costar con la ayuda del seguro (puede ser ms barato con su seguro), pero el sitio web puede darle el precio si no utiliz Tourist information centre manager.  - Puede imprimir el cupn correspondiente y llevarlo con su receta a la farmacia.  - Tambin puede pasar por nuestra oficina durante el horario de atencin regular y Education officer, museum una tarjeta de cupones de GoodRx.  - Si necesita que su receta se enve electrnicamente a una farmacia diferente, informe a nuestra oficina a travs de MyChart de [place] o por telfono llamando al [phone] y presione la opcin 4.

## 2023-11-22 ENCOUNTER — Ambulatory Visit: Payer: Medicaid Other

## 2023-11-29 ENCOUNTER — Ambulatory Visit: Payer: Medicaid Other

## 2023-11-29 ENCOUNTER — Other Ambulatory Visit (HOSPITAL_COMMUNITY)
Admission: RE | Admit: 2023-11-29 | Discharge: 2023-11-29 | Disposition: A | Discharge status: Home / Self Care | Source: Ambulatory Visit | Attending: Obstetrics & Gynecology | Admitting: Obstetrics & Gynecology

## 2023-11-29 ENCOUNTER — Ambulatory Visit (INDEPENDENT_AMBULATORY_CARE_PROVIDER_SITE_OTHER): Payer: Medicaid Other | Admitting: Obstetrics & Gynecology

## 2023-11-29 VITALS — BP 134/88 | HR 70

## 2023-11-29 DIAGNOSIS — L409 Psoriasis, unspecified: Secondary | ICD-10-CM | POA: Diagnosis not present

## 2023-11-29 DIAGNOSIS — R87810 Cervical high risk human papillomavirus (HPV) DNA test positive: Secondary | ICD-10-CM

## 2023-11-29 DIAGNOSIS — R87610 Atypical squamous cells of undetermined significance on cytologic smear of cervix (ASC-US): Secondary | ICD-10-CM | POA: Diagnosis not present

## 2023-11-29 DIAGNOSIS — Z113 Encounter for screening for infections with a predominantly sexual mode of transmission: Secondary | ICD-10-CM | POA: Insufficient documentation

## 2023-11-29 MED ORDER — SECUKINUMAB 300 MG/2ML ~~LOC~~ SOAJ
300.0000 mg | Freq: Once | SUBCUTANEOUS | Status: AC
  Administered 2023-11-29: 300 mg via SUBCUTANEOUS

## 2023-11-29 NOTE — Progress Notes (Signed)
 Patient here for second loading dose of Cosentyx.   Patient did not make appointment last week so on week 2 out of 5.   Cosentyx 300mg  Unoready injected into right upper arm. Patient tolerated injection well.  LOT: [id number] EXP: 06/17/2025  Dorathy Daft, RMA

## 2023-11-29 NOTE — Progress Notes (Signed)
    Colposcopy Procedure Note:    Colposcopy Procedure Note  Indications:  2025 LSIL + HR HPV, negative for 16/18/45 Last previous Pap 2020 was normal   2019 ASCCP recommendation:  Smoker:  Yes.   New sexual partner:  No.    History of abnormal Pap: yes  Procedure Details  The risks and benefits of the procedure and Written informed consent obtained.  Speculum placed in vagina and excellent visualization of cervix achieved, cervix swabbed x 3 with acetic acid solution.  Findings: Adequate colposcopy is noted today.  Cervix: no visible lesions, no mosaicism, no punctation, and no abnormal vasculature; SCJ visualized 360 degrees without lesions and no biopsies taken. Vaginal inspection: normal without visible lesions. Vulvar colposcopy: vulvar colposcopy not performed.  Specimens: none  Complications: none.  Colposcopic Impression: Normal colposcopy  Plan(Based on 2019 ASCCP recommendations) Repeat HPV based cytology 1 year

## 2023-11-30 LAB — CERVICOVAGINAL ANCILLARY ONLY
Bacterial Vaginitis (gardnerella): POSITIVE — AB
Candida Glabrata: NEGATIVE
Candida Vaginitis: NEGATIVE
Chlamydia: NEGATIVE
Comment: NEGATIVE
Comment: NEGATIVE
Comment: NEGATIVE
Comment: NEGATIVE
Comment: NEGATIVE
Comment: NORMAL
Neisseria Gonorrhea: NEGATIVE
Trichomonas: NEGATIVE

## 2023-12-06 ENCOUNTER — Telehealth: Payer: Self-pay

## 2023-12-06 ENCOUNTER — Ambulatory Visit: Payer: Medicaid Other

## 2023-12-06 DIAGNOSIS — L409 Psoriasis, unspecified: Secondary | ICD-10-CM | POA: Diagnosis not present

## 2023-12-06 NOTE — Telephone Encounter (Signed)
 I have resubmitted the PA for Cosentyx with updated office notes and still receiving a denial.  -Patient must try and fail/unable to tolerate/cannot use phototherapy -Patient must try or unable to tolerate Soriatane, Methotrexate or Cyclosporine (which was documented in the last office note).

## 2023-12-06 NOTE — Progress Notes (Signed)
 Patient here for third loading dose of Cosentyx.  This will be week 3 our of 5.    Cosentyx 300mg  Unoready injected into left upper arm. Patient tolerated injection well.   LOT: [id number] EXP: 06/17/2025   Dorathy Daft, RMA

## 2023-12-13 ENCOUNTER — Ambulatory Visit: Payer: Medicaid Other

## 2023-12-18 NOTE — Telephone Encounter (Signed)
 I will have patient complete PAP application at nurse visit to see if she is eligible for free drug. aw

## 2023-12-20 ENCOUNTER — Ambulatory Visit

## 2023-12-27 ENCOUNTER — Ambulatory Visit

## 2023-12-27 DIAGNOSIS — L409 Psoriasis, unspecified: Secondary | ICD-10-CM | POA: Diagnosis not present

## 2023-12-27 MED ORDER — SECUKINUMAB 300 MG/2ML ~~LOC~~ SOAJ
300.0000 mg | Freq: Once | SUBCUTANEOUS | Status: AC
  Administered 2023-12-27: 300 mg via SUBCUTANEOUS

## 2023-12-27 NOTE — Progress Notes (Signed)
 Patient here for third loading dose of Cosentyx.  This will be week 4 out of 5.    Cosentyx 300mg  Unoready injected into right upper arm. Patient tolerated injection well.   LOT: [id number] EXP: 06/17/2025   Dorathy Daft, RMA

## 2024-01-03 ENCOUNTER — Ambulatory Visit

## 2024-01-10 ENCOUNTER — Encounter: Payer: Self-pay | Admitting: Dermatology

## 2024-01-10 ENCOUNTER — Ambulatory Visit (INDEPENDENT_AMBULATORY_CARE_PROVIDER_SITE_OTHER): Payer: Medicaid Other | Admitting: Dermatology

## 2024-01-10 DIAGNOSIS — R21 Rash and other nonspecific skin eruption: Secondary | ICD-10-CM | POA: Diagnosis not present

## 2024-01-10 DIAGNOSIS — Z79899 Other long term (current) drug therapy: Secondary | ICD-10-CM | POA: Diagnosis not present

## 2024-01-10 DIAGNOSIS — L409 Psoriasis, unspecified: Secondary | ICD-10-CM

## 2024-01-10 DIAGNOSIS — Z7189 Other specified counseling: Secondary | ICD-10-CM

## 2024-01-10 DIAGNOSIS — L4050 Arthropathic psoriasis, unspecified: Secondary | ICD-10-CM | POA: Diagnosis not present

## 2024-01-10 MED ORDER — TRIAMCINOLONE ACETONIDE 0.1 % EX CREA: TOPICAL_CREAM | CUTANEOUS | 2 refills | Status: AC

## 2024-01-10 MED ORDER — SECUKINUMAB 300 MG/2ML ~~LOC~~ SOAJ
300.0000 mg | Freq: Once | SUBCUTANEOUS | Status: AC
  Administered 2024-01-10: 300 mg via SUBCUTANEOUS

## 2024-01-10 MED ORDER — SECUKINUMAB 300 MG/2ML ~~LOC~~ SOAJ
300.0000 mg | SUBCUTANEOUS | Status: AC
  Administered 2024-02-20 – 2024-08-26 (×5): 300 mg via SUBCUTANEOUS

## 2024-01-10 NOTE — Progress Notes (Signed)
 Follow-Up Visit   Subjective  Meghan Simmons is a 58 y.o. female who presents for the following: Psoriasis. 2 month follow up. Started Cosentyx  injections 11/15/2023. Today's injection will be 5th loading dose This will be week 5 out of 5. Patient cancelled appointment for 5th dose scheduled for last week, transportation issues. States is not hurting or itching as badly as before. Elbows are bothersome. L>R.   Rash on forearms. Popping up recently. 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 Itching but not badly. Has been out in the yard mowing.   The following portions of the chart were reviewed this encounter and updated as appropriate: medications, allergies, medical history  Review of Systems:  No other skin or systemic complaints except as noted in HPI or Assessment and Plan.  Objective  Well appearing patient in no apparent distress; mood and affect are within normal limits.  Areas Examined: Face, arms, legs, abdomen, back  Relevant exam findings are noted in the Assessment and Plan.      Assessment & Plan   PSORIASIS   Related Medications Secukinumab , 300 MG Dose, (COSENTYX  SENSOREADY, 300 MG,) 150 MG/ML SOAJ Inject 2 mLs (300 mg total) into the skin as directed. On week 0, 1, 2, 3 and 4. Secukinumab , 300 MG Dose, (COSENTYX  SENSOREADY, 300 MG,) 150 MG/ML SOAJ Inject 2 mLs (300 mg total) into the skin every 28 (twenty-eight) days. For maintenance. Secukinumab  SOAJ 300 mg  Secukinumab  SOAJ 300 mg  PSA (PSORIATIC ARTHRITIS) (HCC)   Related Medications Secukinumab , 300 MG Dose, (COSENTYX  SENSOREADY, 300 MG,) 150 MG/ML SOAJ Inject 2 mLs (300 mg total) into the skin as directed. On week 0, 1, 2, 3 and 4. Secukinumab , 300 MG Dose, (COSENTYX  SENSOREADY, 300 MG,) 150 MG/ML SOAJ Inject 2 mLs (300 mg total) into the skin every 28 (twenty-eight) days. For maintenance. Secukinumab  SOAJ 300 mg  Secukinumab  SOAJ 300 mg  LONG-TERM USE OF HIGH-RISK MEDICATION   Related  Medications Secukinumab , 300 MG Dose, (COSENTYX  SENSOREADY, 300 MG,) 150 MG/ML SOAJ Inject 2 mLs (300 mg total) into the skin as directed. On week 0, 1, 2, 3 and 4. Secukinumab , 300 MG Dose, (COSENTYX  SENSOREADY, 300 MG,) 150 MG/ML SOAJ Inject 2 mLs (300 mg total) into the skin every 28 (twenty-eight) days. For maintenance. COUNSELING AND COORDINATION OF CARE   RASH    PSORIASIS on systemic treatment, with PSORIATIC ARTHRITIS, improving Well-demarcated erythematous papules/plaques with silvery scale, guttate pink scaly papules. Elbows, knees, postauricular sulci, umbilicus. 3% BSA with past episodes of erythroderma (2024, 2023, and ~5 years ago per patient report). Postinflammatory erythema scattered on legs from recent lesions.  Chronic and persistent condition with duration or expected duration over one year. Condition is improving with treatment but not currently at goal.   Counseling and coordination of care for severe psoriasis on systemic treatment  Psoriasis - severe on systemic treatment.  Psoriasis is a chronic non-curable, but treatable genetic/hereditary disease that may have other systemic features affecting other organ systems such as joints (Psoriatic Arthritis).  It is linked with heart disease, inflammatory bowel disease, non-alcoholic fatty liver disease, and depression. Significant skin psoriasis and/or psoriatic arthritis may have significant symptoms and affects activities of daily activity and often benefits from systemic treatments.  These systemic treatments have some potential side effects including immunosuppression and require pre-treatment laboratory screening and periodic laboratory monitoring and periodic in person evaluation and monitoring by the attending dermatologist physician (long term medication management).   Patient with joint pain  in fingers, elbows, knees.   Treatment Plan:  Continue Cosentyx  300 mg/2 ml injection every 28 days  Sample of Cosentyx   UnoReady Pen 300 mg/2 ml injected into L upper arm today NDC: 1610-9604-54 Lot: [facility] Exp: 08/17/2025  Will send appeal to insurance for coverage of Cosentyx . Continue to supply with samples until approved.   Reviewed risks of biologics including immunosuppression, infections, injection site reaction, and failure to improve condition. Goal is control of skin condition, not cure.  Some older biologics such as Humira and Enbrel may slightly increase risk of malignancy and may worsen congestive heart failure.  Taltz and Cosentyx  may cause inflammatory bowel disease to flare. The use of biologics requires long term medication management, including periodic office visits and monitoring of blood work.   Long term medication management.  Patient is using long term (months to years) prescription medication  to control their dermatologic condition.  These medications require periodic monitoring to evaluate for efficacy and side effects and may require periodic laboratory monitoring.    Rash; ddx allergic eczema  Exam: Scattered pink papules at forearms and back.   Treatment Plan: Start Triamcinolone  0.1% cream twice a day to rash on arms back and elbows until improved.    Topical steroids (such as triamcinolone , fluocinolone, fluocinonide, mometasone, clobetasol , halobetasol, betamethasone , hydrocortisone ) can cause thinning and lightening of the skin if they are used for too long in the same area. Your physician has selected the right strength medicine for your problem and area affected on the body. Please use your medication only as directed by your physician to prevent side effects.    Return in about 1 month (around 02/09/2024) for Cosentyx  Injection On Nurse Schedule; 6 month psoriasis follow up with Dr. Felipe Horton.  I, Jill Parcell, CMA, am acting as scribe for Harris Liming, MD.   Documentation: I have reviewed the above documentation for accuracy and completeness, and I agree with the  above.  Harris Liming, MD

## 2024-01-10 NOTE — Patient Instructions (Addendum)
 Continue Cosentyx  300 mg/2 ml injection every 28 days   Start Triamcinolone  0.1% cream twice a day to rash on arms and elbows until improved.     Reviewed risks of biologics including immunosuppression, infections, injection site reaction, and failure to improve condition. Goal is control of skin condition, not cure.  Some older biologics such as Humira and Enbrel may slightly increase risk of malignancy and may worsen congestive heart failure.  Taltz and Cosentyx  may cause inflammatory bowel disease to flare. The use of biologics requires long term medication management, including periodic office visits and monitoring of blood work.    Topical steroids (such as triamcinolone , fluocinolone, fluocinonide, mometasone, clobetasol , halobetasol, betamethasone , hydrocortisone ) can cause thinning and lightening of the skin if they are used for too long in the same area. Your physician has selected the right strength medicine for your problem and area affected on the body. Please use your medication only as directed by your physician to prevent side effects.     Due to recent changes in healthcare laws, you may see results of your pathology and/or laboratory studies on MyChart before the doctors have had a chance to review them. We understand that in some cases there may be results that are confusing or concerning to you. Please understand that not all results are received at the same time and often the doctors may need to interpret multiple results in order to provide you with the best plan of care or course of treatment. Therefore, we ask that you please give us  2 business days to thoroughly review all your results before contacting the office for clarification. Should we see a critical lab result, you will be contacted sooner.   If You Need Anything After Your Visit  If you have any questions or concerns for your doctor, please call our main line at [phone] and press option 4 to reach your doctor's  medical assistant. If no one answers, please leave a voicemail as directed and we will return your call as soon as possible. Messages left after 4 pm will be answered the following business day.   You may also send us  a message via MyChart. We typically respond to MyChart messages within 1-2 business days.  For prescription refills, please ask your pharmacy to contact our office. Our fax number is [phone].  If you have an urgent issue when the clinic is closed that cannot wait until the next business day, you can page your doctor at the number below.    Please note that while we do our best to be available for urgent issues outside of office hours, we are not available 24/7.   If you have an urgent issue and are unable to reach us , you may choose to seek medical care at your doctor's office, retail clinic, urgent care center, or emergency room.  If you have a medical emergency, please immediately call 911 or go to the emergency department.  Pager Numbers  - Dr. Bary Likes: [phone]  - Dr. Annette Barters: [phone]  - Dr. Felipe Horton: [phone]   In the event of inclement weather, please call our main line at [phone] for an update on the status of any delays or closures.  Dermatology Medication Tips: Please keep the boxes that topical medications come in in order to help keep track of the instructions about where and how to use these. Pharmacies typically print the medication instructions only on the boxes and not directly on the medication tubes.   If your medication is too  expensive, please contact our office at [phone] option 4 or send us  a message through MyChart.   We are unable to tell what your co-pay for medications will be in advance as this is different depending on your insurance coverage. However, we may be able to find a substitute medication at lower cost or fill out paperwork to get insurance to cover a needed medication.   If a prior authorization is required  to get your medication covered by your insurance company, please allow us  1-2 business days to complete this process.  Drug prices often vary depending on where the prescription is filled and some pharmacies may offer cheaper prices.  The website www.goodrx.com contains coupons for medications through different pharmacies. The prices here do not account for what the cost may be with help from insurance (it may be cheaper with your insurance), but the website can give you the price if you did not use any insurance.  - You can print the associated coupon and take it with your prescription to the pharmacy.  - You may also stop by our office during regular business hours and pick up a GoodRx coupon card.  - If you need your prescription sent electronically to a different pharmacy, notify our office through [facility] or by phone at [phone] option 4.     Si Usted Necesita Algo Despus de Su Visita  Tambin puede enviarnos un mensaje a travs de Clinical cytogeneticist. Por lo general respondemos a los mensajes de MyChart en el transcurso de 1 a 2 das hbiles.  Para renovar recetas, por favor pida a su farmacia que se ponga en contacto con nuestra oficina. Franz Jacks de fax es [place] [phone].  Si tiene un asunto urgente cuando la clnica est cerrada y que no puede esperar hasta el siguiente da hbil, puede llamar/localizar a su doctor(a) al nmero que aparece a continuacin.   Por favor, tenga en cuenta que aunque hacemos todo lo posible para estar disponibles para asuntos urgentes fuera del horario de [place], no estamos disponibles las 24 horas del da, los 7 [address]  [address] de la [place].   Si tiene un problema urgente y no puede comunicarse con nosotros, puede optar por buscar atencin mdica  en el consultorio de su doctor(a), en una clnica privada, en un centro de atencin urgente o en una sala de emergencias.  Si tiene Engineer, drilling, por favor llame inmediatamente al 911 o vaya a la sala  de emergencias.  Nmeros de bper  - Dr. Bary Likes: [phone]  - Dra. Annette Barters: [phone]  - Dr. Felipe Horton: [phone]   En caso de inclemencias del tiempo, por favor llame a Lajuan Pila principal al [phone] para una actualizacin sobre el [place] de cualquier retraso o cierre.  Consejos para la medicacin en dermatologa: Por favor, guarde las cajas en las que vienen los medicamentos de uso tpico para ayudarle a seguir las instrucciones sobre dnde y cmo usarlos. Las farmacias generalmente imprimen las instrucciones del medicamento slo en las cajas y no directamente en los tubos del [place].   Si su medicamento es muy caro, por favor, pngase en contacto con Bettyjane Brunet llamando al [phone] y presione la opcin 4 o envenos un mensaje a travs de Clinical cytogeneticist.   No podemos decirle cul ser su copago por los medicamentos por adelantado ya que esto es diferente dependiendo de la cobertura de su seguro. Sin embargo, es posible que podamos encontrar un medicamento sustituto a Audiological scientist un formulario para que el  seguro cubra el medicamento que se considera necesario.   Si se requiere una autorizacin previa para que su compaa de seguros [place] su medicamento, por favor permtanos de 1 a 2 das hbiles para completar este proceso.  Los precios de los medicamentos varan con frecuencia dependiendo del Environmental consultant de dnde se surte la receta y alguna farmacias pueden ofrecer precios ms baratos.  El sitio web www.goodrx.com tiene cupones para medicamentos de Health and safety inspector. Los precios aqu no tienen en cuenta lo que podra costar con la ayuda del seguro (puede ser ms barato con su seguro), pero el sitio web puede darle el precio si no utiliz Tourist information centre manager.  - Puede imprimir el cupn correspondiente y llevarlo con su receta a la farmacia.  - Tambin puede pasar por nuestra oficina durante el horario de atencin regular y Education officer, museum una tarjeta de cupones de GoodRx.  -  Si necesita que su receta se enve electrnicamente a una farmacia diferente, informe a nuestra oficina a travs de MyChart de [place] o por telfono llamando al [phone] y presione la opcin 4.

## 2024-01-18 ENCOUNTER — Encounter: Payer: Self-pay | Admitting: Dermatology

## 2024-02-04 ENCOUNTER — Other Ambulatory Visit (HOSPITAL_COMMUNITY): Payer: Self-pay | Admitting: Gerontology

## 2024-02-04 ENCOUNTER — Ambulatory Visit (HOSPITAL_COMMUNITY)
Admission: RE | Admit: 2024-02-04 | Discharge: 2024-02-04 | Disposition: A | Discharge status: Home / Self Care | Source: Ambulatory Visit | Attending: Gerontology | Admitting: Gerontology

## 2024-02-04 ENCOUNTER — Other Ambulatory Visit (HOSPITAL_COMMUNITY)
Admission: RE | Admit: 2024-02-04 | Discharge: 2024-02-04 | Disposition: A | Discharge status: Home / Self Care | Source: Ambulatory Visit | Attending: Internal Medicine | Admitting: Internal Medicine

## 2024-02-04 DIAGNOSIS — M25532 Pain in left wrist: Secondary | ICD-10-CM | POA: Insufficient documentation

## 2024-02-04 LAB — BASIC METABOLIC PANEL WITH GFR
Anion gap: 7 (ref 5–15)
BUN: 9 mg/dL (ref 6–20)
CO2: 22 mmol/L (ref 22–32)
Calcium: 8.8 mg/dL — ABNORMAL LOW (ref 8.9–10.3)
Chloride: 106 mmol/L (ref 98–111)
Creatinine, Ser: 0.78 mg/dL (ref 0.44–1.00)
GFR, Estimated: >60 mL/min (ref >60)
Glucose, Bld: 104 mg/dL — ABNORMAL HIGH (ref 70–99)
Potassium: 4 mmol/L (ref 3.5–5.1)
Sodium: 135 mmol/L (ref 135–145)

## 2024-02-04 LAB — CBC WITH DIFFERENTIAL/PLATELET
Abs Immature Granulocytes: 0.03 10*3/uL (ref 0.00–0.07)
Basophils Absolute: 0.1 10*3/uL (ref 0.0–0.1)
Basophils Relative: 1 %
Eosinophils Absolute: 0.4 10*3/uL (ref 0.0–0.5)
Eosinophils Relative: 5 %
HCT: 44.2 % (ref 36.0–46.0)
Hemoglobin: 14.8 g/dL (ref 12.0–15.0)
Immature Granulocytes: 0 %
Lymphocytes Relative: 28 %
Lymphs Abs: 2.1 10*3/uL (ref 0.7–4.0)
MCH: 29.7 pg (ref 26.0–34.0)
MCHC: 33.5 g/dL (ref 30.0–36.0)
MCV: 88.8 fL (ref 80.0–100.0)
Monocytes Absolute: 0.5 10*3/uL (ref 0.1–1.0)
Monocytes Relative: 7 %
Neutro Abs: 4.4 10*3/uL (ref 1.7–7.7)
Neutrophils Relative %: 59 %
Platelets: 235 10*3/uL (ref 150–400)
RBC: 4.98 MIL/uL (ref 3.87–5.11)
RDW: 13.3 % (ref 11.5–15.5)
WBC: 7.5 10*3/uL (ref 4.0–10.5)
nRBC: 0 % (ref 0.0–0.2)

## 2024-02-04 LAB — HEPATIC FUNCTION PANEL
ALT: 12 U/L (ref 0–44)
AST: 16 U/L (ref 15–41)
Albumin: 3.6 g/dL (ref 3.5–5.0)
Alkaline Phosphatase: 108 U/L (ref 38–126)
Bilirubin, Direct: 0.1 mg/dL (ref 0.0–0.2)
Indirect Bilirubin: 0.4 mg/dL (ref 0.3–0.9)
Total Bilirubin: 0.5 mg/dL (ref 0.0–1.2)
Total Protein: 6.8 g/dL (ref 6.5–8.1)

## 2024-02-04 LAB — LIPID PANEL
Cholesterol: 149 mg/dL (ref 0–200)
HDL: 45 mg/dL (ref >40)
LDL Cholesterol: 86 mg/dL (ref 0–99)
Total CHOL/HDL Ratio: 3.3 ratio
Triglycerides: 90 mg/dL (ref <150)
VLDL: 18 mg/dL (ref 0–40)

## 2024-02-05 LAB — MISC LABCORP TEST (SEND OUT): Labcorp test code: 6510

## 2024-02-05 LAB — HCV RNA QUANT: HCV Quantitative: NOT DETECTED [IU]/mL (ref >50)

## 2024-02-11 ENCOUNTER — Ambulatory Visit

## 2024-02-12 ENCOUNTER — Ambulatory Visit

## 2024-02-19 ENCOUNTER — Ambulatory Visit

## 2024-02-20 ENCOUNTER — Telehealth: Payer: Self-pay

## 2024-02-20 ENCOUNTER — Ambulatory Visit

## 2024-02-20 DIAGNOSIS — L4050 Arthropathic psoriasis, unspecified: Secondary | ICD-10-CM | POA: Diagnosis not present

## 2024-02-20 DIAGNOSIS — L409 Psoriasis, unspecified: Secondary | ICD-10-CM | POA: Diagnosis not present

## 2024-02-20 NOTE — Telephone Encounter (Signed)
 Patient came in today for Cosentyx  injection. While she was here she showed me her arms currently broke out in a rash. I did take a photo and placed in media. She states she thought she was getting an ear infection over the weekend and her friend and boyfriend told her to take some Amoxicillin  that was theirs, she states she took 1 pill. She did break out from Amoxicillin  RX last year. Patient also planted three plants outside the same day. Patient states the areas are itchy but not severe. Do you have any advice for patient for something to use OTC?

## 2024-02-20 NOTE — Progress Notes (Addendum)
 Patient here today for Cosentyx  injection for Psoriasis Vulgaris.    Cosentyx  300mg  Unoready injected into left upper arm. Patient tolerated injection well.   LOT: [facility] EXP: 08/17/2025   Alan Pizza, RMA

## 2024-02-21 NOTE — Telephone Encounter (Signed)
 Left message for pt and sent MyChart message. aw

## 2024-03-19 ENCOUNTER — Ambulatory Visit (INDEPENDENT_AMBULATORY_CARE_PROVIDER_SITE_OTHER)

## 2024-03-19 DIAGNOSIS — L400 Psoriasis vulgaris: Secondary | ICD-10-CM | POA: Diagnosis not present

## 2024-03-19 NOTE — Progress Notes (Signed)
 Patient here today for Cosentyx  injection for Psoriasis Vulgaris.    Cosentyx  300mg  Unoready injected into right upper arm. Patient tolerated injection well.   LOT: [id number] EXP: 09/17/2025   Alan Pizza, RMA

## 2024-03-27 ENCOUNTER — Encounter (INDEPENDENT_AMBULATORY_CARE_PROVIDER_SITE_OTHER): Payer: Self-pay | Admitting: *Deleted

## 2024-04-21 ENCOUNTER — Ambulatory Visit

## 2024-06-02 ENCOUNTER — Ambulatory Visit (INDEPENDENT_AMBULATORY_CARE_PROVIDER_SITE_OTHER)

## 2024-06-02 DIAGNOSIS — L4050 Arthropathic psoriasis, unspecified: Secondary | ICD-10-CM

## 2024-06-02 DIAGNOSIS — L409 Psoriasis, unspecified: Secondary | ICD-10-CM | POA: Diagnosis not present

## 2024-06-02 NOTE — Progress Notes (Signed)
 Patient here today for Cosentyx  injection for Psoriasis Vulgaris.    Cosentyx  300mg  Unoready injected into left upper arm. Patient tolerated injection well.   LOT: DFMX1 EXP: 09/17/2025   Alan Pizza, RMA

## 2024-07-10 ENCOUNTER — Ambulatory Visit: Admitting: Dermatology

## 2024-07-22 ENCOUNTER — Ambulatory Visit: Admitting: Dermatology

## 2024-07-29 ENCOUNTER — Ambulatory Visit: Admitting: Dermatology

## 2024-07-29 ENCOUNTER — Encounter: Payer: Self-pay | Admitting: Dermatology

## 2024-07-29 DIAGNOSIS — L409 Psoriasis, unspecified: Secondary | ICD-10-CM | POA: Diagnosis not present

## 2024-07-29 DIAGNOSIS — Z79899 Other long term (current) drug therapy: Secondary | ICD-10-CM

## 2024-07-29 DIAGNOSIS — Z7189 Other specified counseling: Secondary | ICD-10-CM

## 2024-07-29 DIAGNOSIS — L4050 Arthropathic psoriasis, unspecified: Secondary | ICD-10-CM | POA: Diagnosis not present

## 2024-07-29 NOTE — Patient Instructions (Signed)
 Reviewed risks of biologics including immunosuppression, infections (i.e. TB reactivation), injection site reaction, and failure to improve condition. Goal is control of skin condition, not cure.  Some older biologics such as Humira and Enbrel may slightly increase risk of malignancy and may worsen congestive heart failure.  Taltz, Cosentyx, and Bimzelx may cause inflammatory bowel disease to flare or may increase incidence of yeast infections. Skyrizi , Tremfya , and Stelara may also slightly increase risk of infection. The use of biologics requires long term medication management, including periodic office visits, annual TB screening test and monitoring of blood work.    Due to recent changes in healthcare laws, you may see results of your pathology and/or laboratory studies on MyChart before the doctors have had a chance to review them. We understand that in some cases there may be results that are confusing or concerning to you. Please understand that not all results are received at the same time and often the doctors may need to interpret multiple results in order to provide you with the best plan of care or course of treatment. Therefore, we ask that you please give us  2 business days to thoroughly review all your results before contacting the office for clarification. Should we see a critical lab result, you will be contacted sooner.   If You Need Anything After Your Visit  If you have any questions or concerns for your doctor, please call our main line at [phone] and press option 4 to reach your doctor's medical assistant. If no one answers, please leave a voicemail as directed and we will return your call as soon as possible. Messages left after 4 pm will be answered the following business day.   You may also send us  a message via MyChart. We typically respond to MyChart messages within 1-2 business days.  For prescription refills, please ask your pharmacy to contact our office. Our fax  number is [phone].  If you have an urgent issue when the clinic is closed that cannot wait until the next business day, you can page your doctor at the number below.    Please note that while we do our best to be available for urgent issues outside of office hours, we are not available 24/7.   If you have an urgent issue and are unable to reach us , you may choose to seek medical care at your doctor's office, retail clinic, urgent care center, or emergency room.  If you have a medical emergency, please immediately call 911 or go to the emergency department.  Pager Numbers  - Dr. Hester: [phone]  - Dr. Jackquline: [phone]  - Dr. Claudene: [phone]   - Dr. Raymund: [phone]  In the event of inclement weather, please call our main line at [phone] for an update on the status of any delays or closures.  Dermatology Medication Tips: Please keep the boxes that topical medications come in in order to help keep track of the instructions about where and how to use these. Pharmacies typically print the medication instructions only on the boxes and not directly on the medication tubes.   If your medication is too expensive, please contact our office at [phone] option 4 or send us  a message through MyChart.   We are unable to tell what your co-pay for medications will be in advance as this is different depending on your insurance coverage. However, we may be able to find a substitute medication at lower cost or fill out paperwork to get insurance to cover a needed  medication.   If a prior authorization is required to get your medication covered by your insurance company, please allow us  1-2 business days to complete this process.  Drug prices often vary depending on where the prescription is filled and some pharmacies may offer cheaper prices.  The website www.goodrx.com contains coupons for medications through different pharmacies. The prices here do not account for  what the cost may be with help from insurance (it may be cheaper with your insurance), but the website can give you the price if you did not use any insurance.  - You can print the associated coupon and take it with your prescription to the pharmacy.  - You may also stop by our office during regular business hours and pick up a GoodRx coupon card.  - If you need your prescription sent electronically to a different pharmacy, notify our office through [facility] or by phone at [phone] option 4.     Si Usted Necesita Algo Despus de Su Visita  Tambin puede enviarnos un mensaje a travs de Clinical cytogeneticist. Por lo general respondemos a los mensajes de MyChart en el transcurso de 1 a 2 das hbiles.  Para renovar recetas, por favor pida a su farmacia que se ponga en contacto con nuestra oficina. Randi lakes de fax es [place] [phone].  Si tiene un asunto urgente cuando la clnica est cerrada y que no puede esperar hasta el siguiente da hbil, puede llamar/localizar a su doctor(a) al nmero que aparece a continuacin.   Por favor, tenga en cuenta que aunque hacemos todo lo posible para estar disponibles para asuntos urgentes fuera del horario de [place], no estamos disponibles las 24 horas del da, los 7 [address]  [address] de la [place].   Si tiene un problema urgente y no puede comunicarse con nosotros, puede optar por buscar atencin mdica  en el consultorio de su doctor(a), en una clnica privada, en un centro de atencin urgente o en una sala de emergencias.  Si tiene Engineer, drilling, por favor llame inmediatamente al 911 o vaya a la sala de emergencias.  Nmeros de bper  - Dr. Hester: [phone]  - Dra. Jackquline: [phone]  - Dr. Claudene: [phone]  - Dra. Kitts: [phone]  En caso de inclemencias del [place], por favor llame a nuestra lnea principal al [phone] para una actualizacin sobre el estado de cualquier retraso o cierre.  Consejos para la medicacin en  dermatologa: Por favor, guarde las cajas en las que vienen los medicamentos de uso tpico para ayudarle a seguir las instrucciones sobre dnde y cmo usarlos. Las farmacias generalmente imprimen las instrucciones del medicamento slo en las cajas y no directamente en los tubos del [place].   Si su medicamento es muy caro, por favor, pngase en contacto con landry rieger llamando al [phone] y presione la opcin 4 o envenos un mensaje a travs de Clinical cytogeneticist.   No podemos decirle cul ser su copago por los medicamentos por adelantado ya que esto es diferente dependiendo de la cobertura de su seguro. Sin embargo, es posible que podamos encontrar un medicamento sustituto a Audiological scientist un formulario para que el seguro cubra el medicamento que se considera necesario.   Si se requiere una autorizacin previa para que su compaa de seguros malta su medicamento, por favor permtanos de 1 a 2 das hbiles para completar este proceso.  Los precios de los medicamentos varan con frecuencia dependiendo del Environmental consultant de dnde se surte la receta y Careers adviser pueden ofrecer  precios ms baratos.  El sitio web www.goodrx.com tiene cupones para medicamentos de Health and safety inspector. Los precios aqu no tienen en cuenta lo que podra costar con la ayuda del seguro (puede ser ms barato con su seguro), pero el sitio web puede darle el precio si no utiliz Tourist information centre manager.  - Puede imprimir el cupn correspondiente y llevarlo con su receta a la farmacia.  - Tambin puede pasar por nuestra oficina durante el horario de atencin regular y Education officer, museum una tarjeta de cupones de GoodRx.  - Si necesita que su receta se enve electrnicamente a una farmacia diferente, informe a nuestra oficina a travs de MyChart de [place] o por telfono llamando al [phone] y presione la opcin 4.

## 2024-07-29 NOTE — Progress Notes (Signed)
 Follow-Up Visit   Subjective  Meghan Simmons is a 58 y.o. female who presents for the following: Psoriasis  At elbows. Currently on Cosentyx  300 mg/2 mL with no side effects. Patient does c/o occasional stomach upset and diarrhea.   The following portions of the chart were reviewed this encounter and updated as appropriate: medications, allergies, medical history  Review of Systems:  No other skin or systemic complaints except as noted in HPI or Assessment and Plan.  Objective  Well appearing patient in no apparent distress; mood and affect are within normal limits.  Areas Examined: Elbows, legs  Relevant exam findings are noted in the Assessment and Plan.      Assessment & Plan   PSORIASIS   Related Medications Secukinumab , 300 MG Dose, (COSENTYX  SENSOREADY, 300 MG,) 150 MG/ML SOAJ Inject 2 mLs (300 mg total) into the skin as directed. On week 0, 1, 2, 3 and 4. Secukinumab , 300 MG Dose, (COSENTYX  SENSOREADY, 300 MG,) 150 MG/ML SOAJ Inject 2 mLs (300 mg total) into the skin every 28 (twenty-eight) days. For maintenance. Secukinumab  SOAJ 300 mg  LONG-TERM USE OF HIGH-RISK MEDICATION   Related Medications Secukinumab , 300 MG Dose, (COSENTYX  SENSOREADY, 300 MG,) 150 MG/ML SOAJ Inject 2 mLs (300 mg total) into the skin as directed. On week 0, 1, 2, 3 and 4. Secukinumab , 300 MG Dose, (COSENTYX  SENSOREADY, 300 MG,) 150 MG/ML SOAJ Inject 2 mLs (300 mg total) into the skin every 28 (twenty-eight) days. For maintenance. 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 < 1% BSA.  Chronic and persistent condition with duration or expected duration over one year. Condition is symptomatic / bothersome to patient. Not to goal.   Counseling and coordination of care for severe psoriasis on systemic treatment  Psoriasis - severe  on systemic treatment.  Psoriasis is a chronic non-curable, but treatable genetic/hereditary disease that may have other systemic features affecting other organ systems such as joints (Psoriatic Arthritis).  It is linked with heart disease, inflammatory bowel disease, non-alcoholic fatty liver disease, and depression. Significant skin psoriasis and/or psoriatic arthritis may have significant symptoms and affects activities of daily activity and often benefits from systemic treatments.  These systemic treatments have some potential side effects including immunosuppression and require pre-treatment laboratory screening and periodic laboratory monitoring and periodic in person evaluation and monitoring by the attending dermatologist physician (long term medication management).   Patient with joint pain at hands, wrists, elbows and knees  Treatment Plan: Patient would like to continue Cosentyx  300 mg/2 mL SQ Qmonth.   Sample of Cosentyx  300 mg/2 mL injected today to right upper arm. Patient tolerated well.  NDC 9921-8929-03 Lot # [id number]  Exp: 11/15/2025 Quant gold negative 09/2023 Will order TB test in January 2026. Patient advised we will send her a message via MyChart to remind her.   Reviewed risks of biologics including immunosuppression, infections (i.e. TB reactivation), injection site reaction, and failure to improve condition. Goal is control of skin condition, not cure.  Some older biologics such as Humira and Enbrel may slightly increase risk of malignancy and may worsen congestive heart failure.  Taltz, Cosentyx , and Bimzelx may cause inflammatory bowel disease to flare or may increase incidence of yeast infections. Skyrizi, Tremfya, and Stelara may also slightly increase risk of infection. The use of biologics requires long term medication management, including periodic office visits, annual TB  screening test and monitoring of blood work.   Long term medication management.  Patient is using long  term (months to years) prescription medication  to control their dermatologic condition.  These medications require periodic monitoring to evaluate for efficacy and side effects and may require periodic laboratory monitoring.      Latest Ref Rng & Units 09/26/2023    2:58 PM  Quantiferon TB Gold  Quantiferon TB Gold Plus Negative Negative     Return in about 1 year (around 07/29/2025) for Psoriasis, with Dr. Claudene and 2 weeks with nurse for injection.  LILLETTE Lonell Drones, RMA, am acting as scribe for Boneta Claudene, MD .   Documentation: I have reviewed the above documentation for accuracy and completeness, and I agree with the above.  Boneta Claudene, MD

## 2024-08-26 ENCOUNTER — Ambulatory Visit

## 2024-08-26 NOTE — Progress Notes (Signed)
 Patient here today for Cosentyx  injection for Psoriasis Vulgaris.    Cosentyx  300mg  Unoready injected into left upper arm. Patient tolerated injection well.    LOT: [id number] EXP: 11/15/2025  Alan Pizza, RMA

## 2024-09-23 ENCOUNTER — Ambulatory Visit

## 2024-09-25 ENCOUNTER — Encounter: Payer: Self-pay | Admitting: Dermatology

## 2024-09-25 ENCOUNTER — Ambulatory Visit (INDEPENDENT_AMBULATORY_CARE_PROVIDER_SITE_OTHER): Admitting: Dermatology

## 2024-09-25 DIAGNOSIS — L820 Inflamed seborrheic keratosis: Secondary | ICD-10-CM

## 2024-09-25 DIAGNOSIS — Z7189 Other specified counseling: Secondary | ICD-10-CM

## 2024-09-25 DIAGNOSIS — L409 Psoriasis, unspecified: Secondary | ICD-10-CM

## 2024-09-25 DIAGNOSIS — Z79899 Other long term (current) drug therapy: Secondary | ICD-10-CM

## 2024-09-25 MED ORDER — SECUKINUMAB 300 MG/2ML ~~LOC~~ SOAJ
300.0000 mg | SUBCUTANEOUS | Status: AC
  Administered 2024-09-25 – 2024-10-23 (×2): 300 mg via SUBCUTANEOUS

## 2024-09-25 MED ORDER — COSENTYX UNOREADY 300 MG/2ML ~~LOC~~ SOAJ: 300.0000 mg | SUBCUTANEOUS | 8 refills | Status: AC

## 2024-09-25 NOTE — Patient Instructions (Signed)

## 2024-09-25 NOTE — Progress Notes (Deleted)
" ° °  Follow-Up Visit   Subjective  Meghan Simmons is a 59 y.o. female who presents for the following: Spot on left jaw line. Raised, pink. Would like Dr. Claudene to check area to see if he can remove it.   Here for Cosentyx  injection for psoriasis.   The patient has spots, moles and lesions to be evaluated, some may be new or changing and the patient may have concern these could be cancer.     The following portions of the chart were reviewed this encounter and updated as appropriate: medications, allergies, medical history  Review of Systems:  No other skin or systemic complaints except as noted in HPI or Assessment and Plan.  Objective  Well appearing patient in no apparent distress; mood and affect are within normal limits.  A focused examination was performed of the following areas: Face  Relevant physical exam findings are noted in the Assessment and Plan.    Assessment & Plan      Patient here today for Cosentyx  injection for Psoriasis Vulgaris.    Cosentyx  300mg  Unoready injected into left upper arm. Patient tolerated injection well.    TB test due today.     PSORIASIS   This Visit - QuantiFERON-TB Gold Plus Existing Treatments - Secukinumab , 300 MG Dose, (COSENTYX  SENSOREADY, 300 MG,) 150 MG/ML SOAJ - Inject 2 mLs (300 mg total) into the skin as directed. On week 0, 1, 2, 3 and 4. - Secukinumab , 300 MG Dose, (COSENTYX  SENSOREADY, 300 MG,) 150 MG/ML SOAJ - Inject 2 mLs (300 mg total) into the skin every 28 (twenty-eight) days. For maintenance. LONG-TERM USE OF HIGH-RISK MEDICATION   This Visit - QuantiFERON-TB Gold Plus Existing Treatments - Secukinumab , 300 MG Dose, (COSENTYX  SENSOREADY, 300 MG,) 150 MG/ML SOAJ - Inject 2 mLs (300 mg total) into the skin as directed. On week 0, 1, 2, 3 and 4. - Secukinumab , 300 MG Dose, (COSENTYX  SENSOREADY, 300 MG,) 150 MG/ML SOAJ - Inject 2 mLs (300 mg total) into the skin every 28 (twenty-eight) days. For  maintenance.   No follow-ups on file.  I, Lavren Lewan, CMA, am acting as scribe for Boneta Claudene, MD.   Documentation: I have reviewed the above documentation for accuracy and completeness, and I agree with the above.  Boneta Claudene, MD    "

## 2024-09-25 NOTE — Progress Notes (Signed)
 "  Follow-Up Visit   Subjective  Meghan Simmons is a 59 y.o. female who presents for the following: Psoriasis. On Cosentyx  300 mg/2 ml with no side effects. Controlling well.   Check spot on left jaw line.  The following portions of the chart were reviewed this encounter and updated as appropriate: medications, allergies, medical history  Review of Systems:  No other skin or systemic complaints except as noted in HPI or Assessment and Plan.  Objective  Well appearing patient in no apparent distress; mood and affect are within normal limits.  Areas Examined: Face, elbows  Relevant exam findings are noted in the Assessment and Plan.      Assessment & Plan   INFLAMED SEBORRHEIC KERATOSIS Exam: Erythematous keratotic or waxy stuck-on papule L angle of mandible  Benign-appearing.  Call clinic for new or changing lesions.   Patient deferred treatment at this time.    PSORIASIS   This Visit - QuantiFERON-TB Gold Plus - Secukinumab  (COSENTYX  UNOREADY) 300 MG/2ML SOAJ - Inject 300 mg into the skin every 28 (twenty-eight) days. MAINTENANCE:  Starting week 8 - Inject 300 mg into the skin every 28 (twenty-eight) days. - Secukinumab  SOAJ 300 mg Existing Treatments - Secukinumab , 300 MG Dose, (COSENTYX  SENSOREADY, 300 MG,) 150 MG/ML SOAJ - Inject 2 mLs (300 mg total) into the skin as directed. On week 0, 1, 2, 3 and 4. - Secukinumab , 300 MG Dose, (COSENTYX  SENSOREADY, 300 MG,) 150 MG/ML SOAJ - Inject 2 mLs (300 mg total) into the skin every 28 (twenty-eight) days. For maintenance. LONG-TERM USE OF HIGH-RISK MEDICATION   This Visit - QuantiFERON-TB Gold Plus - Secukinumab  (COSENTYX  UNOREADY) 300 MG/2ML SOAJ - Inject 300 mg into the skin every 28 (twenty-eight) days. MAINTENANCE:  Starting week 8 - Inject 300 mg into the skin every 28 (twenty-eight) days. Existing Treatments - Secukinumab , 300 MG Dose, (COSENTYX  SENSOREADY, 300 MG,) 150 MG/ML SOAJ - Inject 2 mLs (300 mg total) into  the skin as directed. On week 0, 1, 2, 3 and 4. - Secukinumab , 300 MG Dose, (COSENTYX  SENSOREADY, 300 MG,) 150 MG/ML SOAJ - Inject 2 mLs (300 mg total) into the skin every 28 (twenty-eight) days. For maintenance. COUNSELING AND COORDINATION OF CARE   MEDICATION MANAGEMENT   INFLAMED SEBORRHEIC KERATOSIS    PSORIASIS on systemic treatment, with PSORIATIC ARTHRITIS, clear on cosentyx  Exam: lichenified plaques elbows knees 0% BSA.  Chronic condition with duration or expected duration over one year. Currently well-controlled.   Counseling and coordination of care for severe psoriasis on systemic treatment  Psoriasis - severe on systemic treatment.  Psoriasis is a chronic non-curable, but treatable genetic/hereditary disease that may have other systemic features affecting other organ systems such as joints (Psoriatic Arthritis).  It is linked with heart disease, inflammatory bowel disease, non-alcoholic fatty liver disease, and depression. Significant skin psoriasis and/or psoriatic arthritis may have significant symptoms and affects activities of daily activity and often benefits from systemic treatments.  These systemic treatments have some potential side effects including immunosuppression and require pre-treatment laboratory screening and periodic laboratory monitoring and periodic in person evaluation and monitoring by the attending dermatologist physician (long term medication management).   Patient with joint pain at hands, wrists, elbows and knees   Treatment Plan: Patient would like to continue Cosentyx  300 mg/2 mL SQ Qmonth. No side effects   Sample of Cosentyx  300 mg/2 mL injected today to right upper arm. Patient tolerated well.  NDC 9921-8929-03 Lot # [id number]  Exp: 01/15/2026  Reviewed risks of biologics including immunosuppression, infections (i.e. TB reactivation), injection site reaction, and failure to improve condition. Goal is control of skin condition, not cure.  Some older  biologics such as Humira and Enbrel may slightly increase risk of malignancy and may worsen congestive heart failure.  Taltz, Cosentyx , and Bimzelx may cause inflammatory bowel disease to flare or may increase incidence of yeast infections. Skyrizi, Tremfya, and Stelara may also slightly increase risk of infection. The use of biologics requires long term medication management, including periodic office visits, annual TB screening test and monitoring of blood work.   Long term medication management.  Patient is using long term (months to years) prescription medication  to control their dermatologic condition.  These medications require periodic monitoring to evaluate for efficacy and side effects and may require periodic laboratory monitoring.   Return for Psoriasis Follow Up as scheduled; , Injection On Nurse Schedule in 4 weeks.  I, Jill Parcell, CMA, am acting as scribe for Boneta Sharps, MD.   Documentation: I have reviewed the above documentation for accuracy and completeness, and I agree with the above.  Boneta Sharps, MD  "

## 2024-09-28 ENCOUNTER — Ambulatory Visit: Payer: Self-pay | Admitting: Dermatology

## 2024-09-28 LAB — QUANTIFERON-TB GOLD PLUS
QuantiFERON Mitogen Value: >10 [IU]/mL
QuantiFERON Nil Value: 0.03 [IU]/mL
QuantiFERON TB1 Ag Value: 0.03 [IU]/mL
QuantiFERON TB2 Ag Value: 0.04 [IU]/mL
QuantiFERON-TB Gold Plus: NEGATIVE

## 2024-10-23 ENCOUNTER — Ambulatory Visit

## 2024-10-23 DIAGNOSIS — L400 Psoriasis vulgaris: Secondary | ICD-10-CM | POA: Diagnosis not present

## 2024-10-23 NOTE — Progress Notes (Signed)
 Patient here today for Cosentyx  injection for Psoriasis Vulgaris.    Cosentyx  300mg  Unoready injected into left upper arm. Patient tolerated injection well.   LOT: [id number] EXP: 11/15/2025   Alan Pizza, RMA

## 2024-11-20 ENCOUNTER — Ambulatory Visit

## 2024-12-08 ENCOUNTER — Ambulatory Visit

## 2025-07-30 ENCOUNTER — Ambulatory Visit: Admitting: Dermatology
